# Patient Record
Sex: Female | Born: 1975
Health system: Southern US, Community
[De-identification: ages and names within clinical notes are randomized; demographics above are authoritative.]

## PROBLEM LIST (undated history)

## (undated) DIAGNOSIS — Z21 Asymptomatic human immunodeficiency virus [HIV] infection status: Secondary | ICD-10-CM

## (undated) DIAGNOSIS — B20 Human immunodeficiency virus [HIV] disease: Secondary | ICD-10-CM

## (undated) HISTORY — DX: Human immunodeficiency virus (HIV) disease: B20

## (undated) HISTORY — PX: NO PAST SURGERIES: SHX2092

## (undated) HISTORY — DX: Asymptomatic human immunodeficiency virus (hiv) infection status: Z21

---

## 1997-12-17 ENCOUNTER — Emergency Department (HOSPITAL_COMMUNITY): Admission: EM | Admit: 1997-12-17 | Discharge: 1997-12-17 | Payer: Self-pay | Admitting: Emergency Medicine

## 1997-12-18 ENCOUNTER — Inpatient Hospital Stay (HOSPITAL_COMMUNITY): Admission: AD | Admit: 1997-12-18 | Discharge: 1997-12-18 | Payer: Self-pay | Admitting: Obstetrics & Gynecology

## 1997-12-20 ENCOUNTER — Inpatient Hospital Stay (HOSPITAL_COMMUNITY): Admission: RE | Admit: 1997-12-20 | Discharge: 1997-12-23 | Payer: Self-pay | Admitting: Obstetrics

## 1998-06-06 ENCOUNTER — Emergency Department (HOSPITAL_COMMUNITY): Admission: EM | Admit: 1998-06-06 | Discharge: 1998-06-06 | Payer: Self-pay | Admitting: Emergency Medicine

## 1999-06-16 ENCOUNTER — Emergency Department (HOSPITAL_COMMUNITY): Admission: EM | Admit: 1999-06-16 | Discharge: 1999-06-16 | Payer: Self-pay | Admitting: Emergency Medicine

## 2000-01-26 ENCOUNTER — Emergency Department (HOSPITAL_COMMUNITY): Admission: EM | Admit: 2000-01-26 | Discharge: 2000-01-26 | Payer: Self-pay | Admitting: Emergency Medicine

## 2000-02-23 ENCOUNTER — Inpatient Hospital Stay (HOSPITAL_COMMUNITY): Admission: EM | Admit: 2000-02-23 | Discharge: 2000-02-23 | Payer: Self-pay | Admitting: *Deleted

## 2000-03-03 ENCOUNTER — Inpatient Hospital Stay (HOSPITAL_COMMUNITY): Admission: EM | Admit: 2000-03-03 | Discharge: 2000-03-03 | Payer: Self-pay | Admitting: *Deleted

## 2000-05-10 ENCOUNTER — Emergency Department (HOSPITAL_COMMUNITY): Admission: EM | Admit: 2000-05-10 | Discharge: 2000-05-10 | Payer: Self-pay | Admitting: Emergency Medicine

## 2001-06-12 ENCOUNTER — Inpatient Hospital Stay (HOSPITAL_COMMUNITY): Admission: AD | Admit: 2001-06-12 | Discharge: 2001-06-12 | Payer: Self-pay | Admitting: *Deleted

## 2002-03-03 ENCOUNTER — Emergency Department (HOSPITAL_COMMUNITY): Admission: EM | Admit: 2002-03-03 | Discharge: 2002-03-03 | Payer: Self-pay

## 2002-06-20 ENCOUNTER — Emergency Department (HOSPITAL_COMMUNITY): Admission: EM | Admit: 2002-06-20 | Discharge: 2002-06-20 | Payer: Self-pay | Admitting: Emergency Medicine

## 2002-06-21 ENCOUNTER — Emergency Department (HOSPITAL_COMMUNITY): Admission: EM | Admit: 2002-06-21 | Discharge: 2002-06-22 | Payer: Self-pay | Admitting: Emergency Medicine

## 2006-10-18 ENCOUNTER — Emergency Department (HOSPITAL_COMMUNITY): Admission: EM | Admit: 2006-10-18 | Discharge: 2006-10-18 | Payer: Self-pay | Admitting: Emergency Medicine

## 2011-03-14 ENCOUNTER — Inpatient Hospital Stay (HOSPITAL_COMMUNITY)
Admission: RE | Admit: 2011-03-14 | Discharge: 2011-03-14 | Disposition: A | Discharge status: Home / Self Care | Payer: Private Health Insurance - Indemnity | Source: Ambulatory Visit | Attending: Family Medicine | Admitting: Family Medicine

## 2011-03-21 ENCOUNTER — Emergency Department (HOSPITAL_COMMUNITY): Payer: Medicaid Other

## 2011-03-21 ENCOUNTER — Emergency Department (HOSPITAL_COMMUNITY)
Admission: EM | Admit: 2011-03-21 | Discharge: 2011-03-21 | Disposition: A | Discharge status: Home / Self Care | Payer: Medicaid Other | Attending: Emergency Medicine | Admitting: Emergency Medicine

## 2011-03-21 DIAGNOSIS — J189 Pneumonia, unspecified organism: Secondary | ICD-10-CM | POA: Insufficient documentation

## 2011-03-21 DIAGNOSIS — R059 Cough, unspecified: Secondary | ICD-10-CM | POA: Insufficient documentation

## 2011-03-21 DIAGNOSIS — R3989 Other symptoms and signs involving the genitourinary system: Secondary | ICD-10-CM | POA: Insufficient documentation

## 2011-03-21 DIAGNOSIS — R079 Chest pain, unspecified: Secondary | ICD-10-CM | POA: Insufficient documentation

## 2011-03-21 DIAGNOSIS — R0602 Shortness of breath: Secondary | ICD-10-CM | POA: Insufficient documentation

## 2011-03-21 DIAGNOSIS — N390 Urinary tract infection, site not specified: Secondary | ICD-10-CM | POA: Insufficient documentation

## 2011-03-21 DIAGNOSIS — F17200 Nicotine dependence, unspecified, uncomplicated: Secondary | ICD-10-CM | POA: Insufficient documentation

## 2011-03-21 DIAGNOSIS — R05 Cough: Secondary | ICD-10-CM | POA: Insufficient documentation

## 2011-03-21 LAB — URINALYSIS, ROUTINE W REFLEX MICROSCOPIC
Protein, ur: 30 mg/dL — AB
Urobilinogen, UA: 0.2 mg/dL (ref 0.0–1.0)

## 2011-03-21 LAB — URINE MICROSCOPIC-ADD ON

## 2011-03-31 ENCOUNTER — Inpatient Hospital Stay (HOSPITAL_COMMUNITY): Payer: Medicaid Other

## 2011-03-31 ENCOUNTER — Inpatient Hospital Stay (HOSPITAL_COMMUNITY)
Admission: AD | Admit: 2011-03-31 | Discharge: 2011-03-31 | Disposition: A | Discharge status: Still In Hospital | Payer: Medicaid Other | Source: Ambulatory Visit | Attending: Family Medicine | Admitting: Family Medicine

## 2011-03-31 ENCOUNTER — Encounter (HOSPITAL_COMMUNITY): Payer: Self-pay | Admitting: *Deleted

## 2011-03-31 ENCOUNTER — Inpatient Hospital Stay (HOSPITAL_COMMUNITY)
Admission: EM | Admit: 2011-03-31 | Discharge: 2011-04-07 | DRG: 976 | Disposition: A | Discharge status: Home / Self Care | Payer: Medicaid Other | Attending: Internal Medicine | Admitting: Internal Medicine

## 2011-03-31 DIAGNOSIS — J189 Pneumonia, unspecified organism: Secondary | ICD-10-CM

## 2011-03-31 DIAGNOSIS — E875 Hyperkalemia: Secondary | ICD-10-CM | POA: Diagnosis present

## 2011-03-31 DIAGNOSIS — F411 Generalized anxiety disorder: Secondary | ICD-10-CM | POA: Diagnosis present

## 2011-03-31 DIAGNOSIS — B59 Pneumocystosis: Secondary | ICD-10-CM | POA: Diagnosis present

## 2011-03-31 DIAGNOSIS — B370 Candidal stomatitis: Secondary | ICD-10-CM | POA: Diagnosis present

## 2011-03-31 DIAGNOSIS — R0902 Hypoxemia: Secondary | ICD-10-CM | POA: Diagnosis present

## 2011-03-31 DIAGNOSIS — D473 Essential (hemorrhagic) thrombocythemia: Secondary | ICD-10-CM | POA: Diagnosis present

## 2011-03-31 DIAGNOSIS — F17200 Nicotine dependence, unspecified, uncomplicated: Secondary | ICD-10-CM | POA: Diagnosis present

## 2011-03-31 DIAGNOSIS — R109 Unspecified abdominal pain: Secondary | ICD-10-CM | POA: Insufficient documentation

## 2011-03-31 DIAGNOSIS — B20 Human immunodeficiency virus [HIV] disease: Principal | ICD-10-CM | POA: Diagnosis present

## 2011-03-31 DIAGNOSIS — D638 Anemia in other chronic diseases classified elsewhere: Secondary | ICD-10-CM | POA: Diagnosis present

## 2011-03-31 DIAGNOSIS — N949 Unspecified condition associated with female genital organs and menstrual cycle: Secondary | ICD-10-CM | POA: Insufficient documentation

## 2011-03-31 DIAGNOSIS — J45909 Unspecified asthma, uncomplicated: Secondary | ICD-10-CM | POA: Diagnosis present

## 2011-03-31 LAB — DIFFERENTIAL
Basophils Absolute: 0 10*3/uL (ref 0.0–0.1)
Lymphocytes Relative: 5 % — ABNORMAL LOW (ref 12–46)
Monocytes Absolute: 0.6 10*3/uL (ref 0.1–1.0)
Monocytes Relative: 3 % (ref 3–12)
Neutro Abs: 19.9 10*3/uL — ABNORMAL HIGH (ref 1.7–7.7)

## 2011-03-31 LAB — URINALYSIS, ROUTINE W REFLEX MICROSCOPIC
Glucose, UA: NEGATIVE mg/dL
Leukocytes, UA: NEGATIVE
Specific Gravity, Urine: 1.01 (ref 1.005–1.030)
pH: 7 (ref 5.0–8.0)

## 2011-03-31 LAB — COMPREHENSIVE METABOLIC PANEL
AST: 15 U/L (ref 0–37)
Alkaline Phosphatase: 78 U/L (ref 39–117)
BUN: 11 mg/dL (ref 6–23)
CO2: 25 mEq/L (ref 19–32)
Chloride: 94 mEq/L — ABNORMAL LOW (ref 96–112)
Creatinine, Ser: 0.55 mg/dL (ref 0.50–1.10)
GFR calc non Af Amer: >60 mL/min (ref >60)
Potassium: 5 mEq/L (ref 3.5–5.1)
Total Bilirubin: 0.2 mg/dL — ABNORMAL LOW (ref 0.3–1.2)

## 2011-03-31 LAB — CBC
HCT: 33.3 % — ABNORMAL LOW (ref 36.0–46.0)
Hemoglobin: 11.5 g/dL — ABNORMAL LOW (ref 12.0–15.0)
RBC: 3.93 MIL/uL (ref 3.87–5.11)
WBC: 21.6 10*3/uL — ABNORMAL HIGH (ref 4.0–10.5)

## 2011-03-31 MED ORDER — HYDROXYZINE HCL 50 MG/ML IM SOLN: 50.0000 mg | Freq: Once | INTRAMUSCULAR | Status: DC

## 2011-03-31 MED ORDER — LORAZEPAM 1 MG PO TABS
1.0000 mg | ORAL_TABLET | Freq: Once | ORAL | Status: AC
  Administered 2011-03-31: 1 mg via ORAL
  Filled 2011-03-31: qty 1

## 2011-03-31 NOTE — Progress Notes (Signed)
Pt complaining of shortness of breath.  

## 2011-03-31 NOTE — ED Provider Notes (Addendum)
History    patient is a 35 year old black female who presents today complaining of abdominal pain, vaginal discharge, shortness of breath. She states she was recently diagnosed with bronchitis and pneumonia. She was given a prescription for Levaquin but had an allergic reaction. On Tuesday of this week she was given steroid injection as well as a prescription for prednisone. At that time she was also placed on an albuterol inhaler. She states her symptoms have continued. She denies fever but states she does have a lot of anxiety and has frequent panic attacks. She thinks some of her shortness of breath is coming from her panic attacks. She also complains of a clear vaginal discharge as well as lower abdominal pain.  Chief Complaint  Patient presents with  . Abdominal Pain   HPI  OB History    No data available      No past medical history on file.  No past surgical history on file.  No family history on file.  History  Substance Use Topics  . Smoking status: Not on file  . Smokeless tobacco: Not on file  . Alcohol Use: Not on file    Allergies: Allergies not on file  No prescriptions prior to admission    Review of Systems  Constitutional: Positive for weight loss, malaise/fatigue and diaphoresis. Negative for fever.  HENT: Positive for congestion and sore throat.   Eyes: Negative for blurred vision and double vision.  Respiratory: Positive for shortness of breath and wheezing. Negative for cough, hemoptysis and sputum production.   Cardiovascular: Negative for chest pain, palpitations, orthopnea and claudication.  Gastrointestinal: Positive for abdominal pain. Negative for nausea, vomiting, diarrhea and constipation.  Genitourinary: Negative for dysuria, urgency, frequency, hematuria and flank pain.  Neurological: Positive for dizziness and weakness. Negative for headaches.  Psychiatric/Behavioral: Negative for depression and suicidal ideas. The patient is nervous/anxious.      Physical Exam   Blood pressure 131/84, pulse 127, temperature 97.4 F (36.3 C), temperature source Oral, resp. rate 24, height 5' (1.524 m), weight 102 lb (46.267 kg), SpO2 91.00%.  Physical Exam  Constitutional: She appears well-developed and well-nourished. No distress.  HENT:  Head: Normocephalic and atraumatic.  Eyes: EOM are normal. Pupils are equal, round, and reactive to light.  Cardiovascular: Regular rhythm.  Tachycardia present.  Exam reveals no friction rub.   No murmur heard. Respiratory: No accessory muscle usage. Tachypnea noted. No apnea. No respiratory distress. She has decreased breath sounds. She has no wheezes. She has no rhonchi. She has no rales.       On examination the patient has poor inspiratory effort. She takes very shallow deep breaths.  GI: Soft. Normal appearance.  Skin: She is not diaphoretic.    MAU Course  Procedures  CBC and CMET are pending.  *RADIOLOGY REPORT*  Clinical Data: Short of breath  CHEST - 2 VIEW  Comparison: 03/21/2011  Findings: Worsening perihilar and bibasilar interstitial  infiltrates. No effusion. Heart size normal. Regional bones  unremarkable.  IMPRESSION:  1. Progressive perihilar and bibasilar interstitial infiltrates  Original Report Authenticated By: Osa Craver, M.D.   Assessment and Plan  1) pneumonia: Patient has worsening findings on chest x-ray. Her O2 sat has been in the 80s and low 90s. I did discuss this patient with Dr. Dierdre Highman at [facility] long emergency department. At this time, an argument can be made for admission. Dr. Dierdre Highman has agreed to see this patient. Therefore, she will be transported to [facility] long emergency  department for further evaluation and probable admission. I did discuss this with the patient at length. She understood and agreed.  Clinton Gallant. Rice III, DrHSc, MPAS, PA-C  03/31/2011, 6:39 PM   Henrietta Hoover, PA 04/08/11 [phone]

## 2011-03-31 NOTE — Progress Notes (Signed)
Went to [facility] about a wk ago, bronchitis was acting up and had abd pain.  Was dx with UTI.  Was given levofloxan- started it 07/09.  States had an allergic reaction- got short of breath, sweating, dehydrated, pain in arm.  Took 6 doses.  Went to [facility] on Tues, given a steroid shot and an inhaler and a prescription for prednisone.  Now having abd pain, and clear vag d/c.

## 2011-04-01 LAB — CBC
MCH: 28.8 pg (ref 26.0–34.0)
MCHC: 34.3 g/dL (ref 30.0–36.0)
Platelets: 436 10*3/uL — ABNORMAL HIGH (ref 150–400)
RDW: 12.3 % (ref 11.5–15.5)

## 2011-04-01 LAB — COMPREHENSIVE METABOLIC PANEL
ALT: 13 U/L (ref 0–35)
Alkaline Phosphatase: 72 U/L (ref 39–117)
BUN: 11 mg/dL (ref 6–23)
CO2: 26 mEq/L (ref 19–32)
GFR calc Af Amer: >60 mL/min (ref >60)
GFR calc non Af Amer: >60 mL/min (ref >60)
Glucose, Bld: 92 mg/dL (ref 70–99)
Potassium: 4.4 mEq/L (ref 3.5–5.1)
Total Bilirubin: 0.1 mg/dL — ABNORMAL LOW (ref 0.3–1.2)
Total Protein: 7.5 g/dL (ref 6.0–8.3)

## 2011-04-01 LAB — IRON AND TIBC
Iron: 68 ug/dL (ref 42–135)
TIBC: 166 ug/dL — ABNORMAL LOW (ref 250–470)
UIBC: 98 ug/dL

## 2011-04-01 LAB — DIFFERENTIAL
Basophils Absolute: 0 10*3/uL (ref 0.0–0.1)
Eosinophils Absolute: 0 10*3/uL (ref 0.0–0.7)
Lymphs Abs: 1.5 10*3/uL (ref 0.7–4.0)
Monocytes Absolute: 1.5 10*3/uL — ABNORMAL HIGH (ref 0.1–1.0)
Monocytes Relative: 10 % (ref 3–12)
Neutro Abs: 12.4 10*3/uL — ABNORMAL HIGH (ref 1.7–7.7)

## 2011-04-01 LAB — CARDIAC PANEL(CRET KIN+CKTOT+MB+TROPI)
CK, MB: 0.8 ng/mL (ref 0.3–4.0)
CK, MB: 2 ng/mL (ref 0.3–4.0)
Relative Index: INVALID (ref 0.0–2.5)
Total CK: 38 U/L (ref 7–177)
Total CK: 8 U/L (ref 7–177)

## 2011-04-01 LAB — FOLATE: Folate: 4.5 ng/mL

## 2011-04-01 LAB — VITAMIN B12: Vitamin B-12: 318 pg/mL (ref 211–911)

## 2011-04-01 LAB — MAGNESIUM: Magnesium: 2.6 mg/dL — ABNORMAL HIGH (ref 1.5–2.5)

## 2011-04-01 NOTE — H&P (Signed)
NAME:  Laura Rogers, Laura Rogers NO.:  [id number]  MEDICAL RECORD NO.:  [id number]  LOCATION:  WLED                         FACILITY:  [facility]  PHYSICIAN:  Talmage Nap, MD  DATE OF BIRTH:  1976/02/19  DATE OF ADMISSION:  03/31/2011 DATE OF DISCHARGE:                             HISTORY & PHYSICAL   History obtainable from the patient and the patient's significant other.  CHIEF COMPLAINT:  Cough, chest pain, and shortness of breath of about 10 days duration.  HISTORY:  The patient is a 35 year old African American female with history of asthma and anxiety disorder.  She initially presented to [facility] on July 9th with complaint of cough that was productive of whitish sputum with associated pleuritic chest pain.  In that presentation, the patient also complained about a subjective feeling of fever.  No chills.  No rigor.  No nausea or vomiting.  She, however, said she had persistent shortness of breath, but denied any PND or orthopnea.  The patient was treated for acute bronchitis as an outpatient and was given Levaquin.  The patient claimed she continued to take Levaquin, but observed that her tongue was getting swollen and she had whitish coating of the mouth.  She also observed that she was not getting better and subsequently presented to Urgent Care.  At Urgent Care, the patient was given steroid injection and subsequently sent home, but her condition continued to deteriorate and presented to [facility] hospital.  At [facility] hospital, the patient had a chest x-ray done, which showed right perihilar infiltrate and subsequently was asked to come back to [facility].  At that time, the patient was seen by me,  she complained about cough that was productive of sputum with associated pleuritic chest pain.  She denied any nausea.  She denied any vomiting.  She denied any PND or orthopnea, but complained of shortness of breath.  After evaluation, she  was advised to be admitted for stabilization.  PAST MEDICAL HISTORY:  Positive for, 1. Asthma. 2. Anxiety disorder.  PAST SURGICAL HISTORY:  Ectopic gestation, status post salpingostomy.  PREADMISSION MEDICATIONS: 1. Albuterol. 2. Ativan. 3. Prednisone. 4. Naproxen.  ALLERGIES: 1. LEVAQUIN. 2. PENICILLIN.  SOCIAL HISTORY:  The patient smokes cigarettes 1 packet to last the patient for about 7 days and she had been smoking for the past 10 years. Periodically uses street drugs, i.e., marijuana and she works for [employer] as a Diplomatic Services operational officer.  FAMILY HISTORY:  Positive for asthma.  REVIEW OF SYSTEMS:  The patient denies any history of headache.  No blurred or vision.  She complained about cough with associated pleuritic chest pain, cough is said to be productive of whitish or occasional yellowish sputum.  She has subjective feeling of fever.  Denies any chills or rigor and no PND or orthopnea.  No abdominal discomfort.  No diarrhea or hematochezia.  No dysuria or hematuria.  No swelling of the lower extremity.  No intolerance to heat or cold.  No neuropsychiatric disorder.  The patient also complained about whitish coating of the tongue.  PHYSICAL EXAMINATION:  GENERAL:  A young lady, dehydrated, not in any acute respiratory distress, has whitish coating  of the mouth. PRESENT VITAL SIGNS:  Blood pressure is 110/86, pulse is 89, respiratory rate 20, temperature is 98.2. HEENT:  Pupils are reactive to light and extraocular muscles are intact. NECK:  No jugular venous distention.  No carotid bruits and no lymphadenopathy. CHEST: Crackles in her right mid and lower zones of the lung. HEART:  Sounds are one and two.  No murmur. ABDOMEN:  Soft with prominent transverse suprapubic scar.  Liver, spleen, kidney not palpable.  Bowel sounds are positive.  EXTREMITIES: No pedal edema. NEUROLOGIC:  Nonfocal. MUSCULOSKELETAL:  Unremarkable. SKIN:  Decreased turgor.  LABORATORY DATA:   Urinalysis done on the patient was unremarkable. Complete blood count with differential showed WBC of 25.6, hemoglobin of 11.5, hematocrit of 33.3, MCV of 84.7 with platelet count of 541. Differentials showed neutrophils 92% and absolute neutrophil count is 19.9.  Comprehensive metabolic panel showed sodium of 131, potassium of 5.0, chloride of 94 with a bicarbonate of 25, glucose is 135, BUN is 11, creatinine 0.55.  IMAGING STUDIES:  Chest x-ray, which showed progressive perihilar and bibasilar interstitial infiltrates.  IMPRESSION: 1. Right perihilar pneumonia. 2. Asthma. 3. Anxiety disorder. 4. Oral thrush. 5. Anemia. 6. Thrombocytosis most likely secondary to anemia.  PLAN:  Plan is to admit the patient to telemetry.  The patient will be slowly rehydrated with half-normal saline IV to go at a rate of 80 cc an hour.  She will be on Rocephin 1 g IV q.24 h., Zithromax 500 mg IV q.24 h.  She also be given albuterol/Atrovent nebulizers q.4 h. and for her anxiety, the patient will be on Xanax 0.25 mg p.o. b.i.d.  Pain will be controlled with morphine 1 mg IV q.4 h., p.r.n. and for the oral thrush, the patient will be on nystatin mouthwash 10 cc p.o. t.i.d.  GI prophylaxis will be done on Protonix 40 mg IV q.24 h. and DVT prophylaxis with TED stockings.  Further workup to be done on this patient will include cardiac enzymes q.6 h. x3, blood culture x2 before starting IV antibiotics, CBC, CMP and magnesium will be repeated in the a.m.  The patient will be followed and evaluated on day-to-day basis.     Talmage Nap, MD     CN/MEDQ  D:  04/01/2011  T:  04/01/2011  Job:  [id number]  Electronically Signed by Talmage Nap  on 04/01/2011 04:24:08 AM

## 2011-04-02 LAB — DIFFERENTIAL
Basophils Absolute: 0 10*3/uL (ref 0.0–0.1)
Basophils Relative: 0 % (ref 0–1)
Eosinophils Relative: 2 % (ref 0–5)
Lymphocytes Relative: 12 % (ref 12–46)
Monocytes Absolute: 1.3 10*3/uL — ABNORMAL HIGH (ref 0.1–1.0)

## 2011-04-02 LAB — URINE MICROSCOPIC-ADD ON

## 2011-04-02 LAB — BASIC METABOLIC PANEL
BUN: 5 mg/dL — ABNORMAL LOW (ref 6–23)
Calcium: 8.6 mg/dL (ref 8.4–10.5)
Creatinine, Ser: 0.52 mg/dL (ref 0.50–1.10)
GFR calc Af Amer: >60 mL/min (ref >60)
GFR calc non Af Amer: >60 mL/min (ref >60)
Potassium: 4.3 mEq/L (ref 3.5–5.1)

## 2011-04-02 LAB — URINALYSIS, ROUTINE W REFLEX MICROSCOPIC
Bilirubin Urine: NEGATIVE
Glucose, UA: NEGATIVE mg/dL
Specific Gravity, Urine: 1.004 — ABNORMAL LOW (ref 1.005–1.030)
pH: 7 (ref 5.0–8.0)

## 2011-04-02 LAB — CBC
HCT: 30.1 % — ABNORMAL LOW (ref 36.0–46.0)
MCHC: 33.9 g/dL (ref 30.0–36.0)
RDW: 12.3 % (ref 11.5–15.5)

## 2011-04-02 LAB — EXPECTORATED SPUTUM ASSESSMENT W GRAM STAIN, RFLX TO RESP C

## 2011-04-03 ENCOUNTER — Inpatient Hospital Stay (HOSPITAL_COMMUNITY): Payer: Medicaid Other

## 2011-04-03 LAB — URINE CULTURE
Colony Count: NO GROWTH
Culture: NO GROWTH

## 2011-04-03 LAB — DIFFERENTIAL
Eosinophils Absolute: 0.4 10*3/uL (ref 0.0–0.7)
Eosinophils Relative: 2 % (ref 0–5)
Lymphocytes Relative: 6 % — ABNORMAL LOW (ref 12–46)
Lymphs Abs: 0.9 10*3/uL (ref 0.7–4.0)
Monocytes Relative: 8 % (ref 3–12)

## 2011-04-03 LAB — CBC
HCT: 31.7 % — ABNORMAL LOW (ref 36.0–46.0)
MCH: 27.8 pg (ref 26.0–34.0)
MCV: 83.9 fL (ref 78.0–100.0)
Platelets: 450 10*3/uL — ABNORMAL HIGH (ref 150–400)
RBC: 3.78 MIL/uL — ABNORMAL LOW (ref 3.87–5.11)
RDW: 12.3 % (ref 11.5–15.5)

## 2011-04-03 LAB — BASIC METABOLIC PANEL
BUN: 7 mg/dL (ref 6–23)
CO2: 25 mEq/L (ref 19–32)
Calcium: 8.6 mg/dL (ref 8.4–10.5)
Chloride: 97 mEq/L (ref 96–112)
Creatinine, Ser: 0.66 mg/dL (ref 0.50–1.10)

## 2011-04-03 LAB — LEGIONELLA ANTIGEN, URINE: Legionella Antigen, Urine: NEGATIVE

## 2011-04-03 MED ORDER — IOHEXOL 300 MG/ML  SOLN
100.0000 mL | Freq: Once | INTRAMUSCULAR | Status: AC | PRN
  Administered 2011-04-03: 100 mL via INTRAVENOUS

## 2011-04-04 DIAGNOSIS — J9600 Acute respiratory failure, unspecified whether with hypoxia or hypercapnia: Secondary | ICD-10-CM

## 2011-04-04 DIAGNOSIS — J189 Pneumonia, unspecified organism: Secondary | ICD-10-CM

## 2011-04-04 LAB — CULTURE, RESPIRATORY W GRAM STAIN

## 2011-04-04 LAB — CBC
Hemoglobin: 10.7 g/dL — ABNORMAL LOW (ref 12.0–15.0)
MCH: 27.9 pg (ref 26.0–34.0)
MCHC: 32.9 g/dL (ref 30.0–36.0)
Platelets: 447 10*3/uL — ABNORMAL HIGH (ref 150–400)
RDW: 12.4 % (ref 11.5–15.5)

## 2011-04-04 LAB — DIFFERENTIAL
Basophils Absolute: 0 10*3/uL (ref 0.0–0.1)
Basophils Relative: 0 % (ref 0–1)
Eosinophils Absolute: 0.4 10*3/uL (ref 0.0–0.7)
Eosinophils Relative: 4 % (ref 0–5)
Monocytes Absolute: 0.9 10*3/uL (ref 0.1–1.0)
Monocytes Relative: 8 % (ref 3–12)
Neutro Abs: 9 10*3/uL — ABNORMAL HIGH (ref 1.7–7.7)

## 2011-04-04 LAB — BASIC METABOLIC PANEL
Calcium: 9 mg/dL (ref 8.4–10.5)
GFR calc Af Amer: >60 mL/min (ref >60)
GFR calc non Af Amer: >60 mL/min (ref >60)
Glucose, Bld: 96 mg/dL (ref 70–99)
Potassium: 4.4 mEq/L (ref 3.5–5.1)
Sodium: 134 mEq/L — ABNORMAL LOW (ref 135–145)

## 2011-04-04 LAB — GRAM STAIN

## 2011-04-04 LAB — LACTATE DEHYDROGENASE: LDH: 376 U/L — ABNORMAL HIGH (ref 94–250)

## 2011-04-04 LAB — PRO B NATRIURETIC PEPTIDE: Pro B Natriuretic peptide (BNP): 14.3 pg/mL (ref 0–125)

## 2011-04-05 LAB — BASIC METABOLIC PANEL
Calcium: 9.3 mg/dL (ref 8.4–10.5)
GFR calc non Af Amer: >60 mL/min (ref >60)
Sodium: 129 mEq/L — ABNORMAL LOW (ref 135–145)

## 2011-04-05 LAB — DIFFERENTIAL
Basophils Relative: 0 % (ref 0–1)
Eosinophils Absolute: 0.3 10*3/uL (ref 0.0–0.7)
Monocytes Absolute: 1.1 10*3/uL — ABNORMAL HIGH (ref 0.1–1.0)
Monocytes Relative: 8 % (ref 3–12)
Neutrophils Relative %: 83 % — ABNORMAL HIGH (ref 43–77)

## 2011-04-05 LAB — CBC
MCH: 27.9 pg (ref 26.0–34.0)
MCHC: 33.7 g/dL (ref 30.0–36.0)
Platelets: 516 10*3/uL — ABNORMAL HIGH (ref 150–400)

## 2011-04-05 LAB — T-HELPER CELLS (CD4) COUNT (NOT AT ARMC)
CD4 % Helper T Cell: 3 % — ABNORMAL LOW (ref 33–55)
CD4 T Cell Abs: 20 uL — ABNORMAL LOW (ref 400–2700)

## 2011-04-06 DIAGNOSIS — Z21 Asymptomatic human immunodeficiency virus [HIV] infection status: Secondary | ICD-10-CM | POA: Insufficient documentation

## 2011-04-06 DIAGNOSIS — B20 Human immunodeficiency virus [HIV] disease: Secondary | ICD-10-CM | POA: Insufficient documentation

## 2011-04-06 LAB — CBC
MCH: 28.2 pg (ref 26.0–34.0)
MCV: 81.4 fL (ref 78.0–100.0)
RBC: 4.08 MIL/uL (ref 3.87–5.11)

## 2011-04-06 LAB — HEPATITIS PANEL, ACUTE
HCV Ab: NEGATIVE
Hep A IgM: NEGATIVE
Hep B C IgM: NEGATIVE

## 2011-04-06 LAB — DIFFERENTIAL
Eosinophils Absolute: 0 10*3/uL (ref 0.0–0.7)
Eosinophils Relative: 0 % (ref 0–5)
Lymphs Abs: 0.8 10*3/uL (ref 0.7–4.0)
Monocytes Relative: 8 % (ref 3–12)

## 2011-04-06 LAB — COMPREHENSIVE METABOLIC PANEL
ALT: 9 U/L (ref 0–35)
Albumin: 2.1 g/dL — ABNORMAL LOW (ref 3.5–5.2)
Alkaline Phosphatase: 79 U/L (ref 39–117)
Chloride: 95 mEq/L — ABNORMAL LOW (ref 96–112)
Glucose, Bld: 113 mg/dL — ABNORMAL HIGH (ref 70–99)
Potassium: 5.5 mEq/L — ABNORMAL HIGH (ref 3.5–5.1)
Sodium: 130 mEq/L — ABNORMAL LOW (ref 135–145)
Total Bilirubin: 0.1 mg/dL — ABNORMAL LOW (ref 0.3–1.2)
Total Protein: 8.1 g/dL (ref 6.0–8.3)

## 2011-04-06 LAB — PNEUMOCYSTIS JIROVECI SMEAR BY DFA: Pneumocystis jiroveci Ag: NEGATIVE

## 2011-04-07 LAB — CULTURE, BLOOD (ROUTINE X 2)
Culture  Setup Time: 201207201104
Culture  Setup Time: 201207201104
Culture: NO GROWTH

## 2011-04-07 NOTE — Consult Note (Signed)
NAMENELWYN, HEBDON NO.:  [id number]  MEDICAL RECORD NO.:  [id number]  LOCATION:  1428                         FACILITY:  [facility]  PHYSICIAN:  Charlaine Dalton. Sherene Sires, MD, FCCPDATE OF BIRTH:  28-Sep-1975  DATE OF CONSULTATION: DATE OF DISCHARGE:                                CONSULTATION   REQUESTING PHYSICIANS: 1. Dr. Janee Morn 2. [facility]  REASON FOR CONSULTATION:  Pneumonia syndrome.  HISTORY:  This is a 35 year old black female with a history of longstanding, but very mild cigarette use and intermittent use of street drugs "who was admitted on July 20th with approximately 2-week history of persistent cough with minimal white mucus production, generalized chest discomfort from coughing, and subjective fever with no rigors. She is having increasing dyspnea with exertion, but nothing at rest and no PND or orthopnea.  She had already been treated as an outpatient with Levaquin, but developed tongue swelling and whitish coat on her mouth, so stopped her Levaquin because that she is allergic to it.  On her initial evaluation, she was felt to have thrush with diffuse infiltrates on chest x-ray, confirmed on CT scan, and Pulmonary and ID were asked to see her in the same afternoon.  She denies any significant sore throat, difficulty swallowing, active sinus complaints, classic lateralizing or exertional chest pain, myalgias, arthralgias, diarrhea or unusual exposure history.  PAST MEDICAL HISTORY:  She has had no previous respiratory disorders, although she is listed as having both asthma and anxiety.  PAST SURGICAL HISTORY:  Significant for ectopic gestation, status post salpingostomy.  MEDICATIONS PRIOR TO ADMISSION:  Albuterol, Ativan, prednisone, and naproxen, although the patient that she had never been on any pulmonary or respiratory medicines at all prior to 2 weeks ago.  ALLERGIES:  Levaquin possible (although I believe the diagnosis  is candidiasis not an allergy) and penicillin nonspecific reaction.  SOCIAL HISTORY:  She is a light smoker, previously worked for [employer]. She admits to using street drugs, but did not elaborate.  FAMILY HISTORY:  Positive for asthma.  Negative for rheumatologic disease.  REVIEW OF SYSTEMS:  Taken in detail, negative.  All systems negative except as already outlined above.  PHYSICAL EXAMINATION:  GENERAL:  This is a relatively thin black female, in no acute distress. VITAL SIGNS:  Stable. HEENT:  Remarkable for thrush. NECK:  Supple without cervical adenopathy or tenderness.  Trachea is midline. LUNGS:  Lung fields reveal minimal crackles on inspiration.  There is no wheezing on expiration. HEART:  Regular rate and rhythm without murmur, gallop, or rub. ABDOMEN:  Soft, benign. EXTREMITIES:  Warm without calf tenderness, cyanosis, or clubbing.  IMAGING:  A series of chest x-rays beginning on July 9th with subtle infiltrates and then by July 22nd has diffuse interstitial changes, which is confirmed on CT scan dated July 22nd.  She has a sed rate of 97 with an LDH of 376 and HIV which is pending.  Her urine legionella is negative as was urine strep and a white count is significant for the absence of eosinophilia and relative lymphopenia.  IMPRESSION:  Progressive pulmonary infiltrates with minimal evidence to suggest an acute pneumonia syndrome.  The pattern on  chest x-ray is classic for PCP pneumonia, although the differential diagnosis needs to include viral pneumonia and bronchiolitis obliterans organizing pneumonia as well as acute interstitial pneumonia and hypersensitivity pneumonitis as well as desquamative interstitial pneumonia (which is largely disease of smokers) as well as Langerhans cell histiocytosis (previously referred to as eosinophilic granulomatosis or Langerhans cell histiocytosis) both of these added to a diagnoses much more common in smokers.  Her human  immunodeficiency virus is pending.  If it is positive, an induced sputum should be all that is necessary to confirm the diagnosis of PCP pneumonia.  If not, we could consider performing a transbronchial biopsy later this week.  In the meantime, she has already been started on appropriate broad antibiotics to include sulfa drugs.  I would hold on steroids until she is seen by Infectious Disease.     Charlaine Dalton. Sherene Sires, MD, [facility]     MBW/MEDQ  D:  04/04/2011  T:  04/05/2011  Job:  [id number]  Electronically Signed by Sandrea Hughs MD FCCP on 04/07/2011 05:42:10 PM

## 2011-04-11 ENCOUNTER — Ambulatory Visit (INDEPENDENT_AMBULATORY_CARE_PROVIDER_SITE_OTHER): Payer: Medicaid Other | Admitting: Infectious Diseases

## 2011-04-11 ENCOUNTER — Encounter: Payer: Self-pay | Admitting: Infectious Diseases

## 2011-04-11 DIAGNOSIS — Z79899 Other long term (current) drug therapy: Secondary | ICD-10-CM

## 2011-04-11 DIAGNOSIS — K5900 Constipation, unspecified: Secondary | ICD-10-CM | POA: Insufficient documentation

## 2011-04-11 DIAGNOSIS — F419 Anxiety disorder, unspecified: Secondary | ICD-10-CM | POA: Insufficient documentation

## 2011-04-11 DIAGNOSIS — B20 Human immunodeficiency virus [HIV] disease: Secondary | ICD-10-CM

## 2011-04-11 DIAGNOSIS — B59 Pneumocystosis: Secondary | ICD-10-CM

## 2011-04-11 DIAGNOSIS — F411 Generalized anxiety disorder: Secondary | ICD-10-CM

## 2011-04-11 LAB — LIPID PANEL
Cholesterol: 191 mg/dL (ref 0–200)
HDL: 37 mg/dL — ABNORMAL LOW (ref >39)
Triglycerides: 203 mg/dL — ABNORMAL HIGH (ref <150)

## 2011-04-11 LAB — COMPREHENSIVE METABOLIC PANEL
Albumin: 3 g/dL — ABNORMAL LOW (ref 3.5–5.2)
BUN: 12 mg/dL (ref 6–23)
CO2: 20 mEq/L (ref 19–32)
Calcium: 8.3 mg/dL — ABNORMAL LOW (ref 8.4–10.5)
Chloride: 91 mEq/L — ABNORMAL LOW (ref 96–112)
Glucose, Bld: 125 mg/dL — ABNORMAL HIGH (ref 70–99)
Potassium: 4.2 mEq/L (ref 3.5–5.3)

## 2011-04-11 MED ORDER — EFAVIRENZ-EMTRICITAB-TENOFOVIR 600-200-300 MG PO TABS: 1.0000 | ORAL_TABLET | Freq: Every day | ORAL | Status: DC

## 2011-04-11 MED ORDER — DOCUSATE SODIUM 100 MG PO CAPS: 100.0000 mg | ORAL_CAPSULE | Freq: Two times a day (BID) | ORAL | Status: AC

## 2011-04-11 MED ORDER — SULFAMETHOXAZOLE-TRIMETHOPRIM 800-160 MG PO TABS: 1.0000 | ORAL_TABLET | Freq: Once | ORAL | Status: AC

## 2011-04-11 NOTE — Assessment & Plan Note (Signed)
She appears to be doing well. Will convert her to qd bactrim when she finishes her 21 day course.

## 2011-04-11 NOTE — Progress Notes (Signed)
  Subjective:    Patient ID: Laura Rogers, female    DOB: 1976/05/20, 35 y.o.   MRN: [id number]  HPI 35 yo F with hx of admission this month to [facility] with pneumonia. She had received po anbx prior to adm and while in hospital was noted to have thrush. Her pneumonia did not improve initially. She was diagnosed with AIDS (CD4 20 and VL 190k) and presumptively dx with PCP (her sputum DFA was negative). She was treated with bactrim and prednisone. Has boyfriend of 3 years, he states he had negative test 2 years ago. She states that he has been retested since her positive test and that he is (-).  Today feels well except she feels like she is going to die.    Review of Systems  Constitutional: Negative for fever and chills.  Respiratory: Negative for cough and shortness of breath.   Gastrointestinal: Positive for constipation.  Genitourinary: Negative for dysuria.  Psychiatric/Behavioral: The patient is nervous/anxious.        Objective:   Physical Exam  Constitutional: She appears well-developed and well-nourished.  Eyes: EOM are normal. Pupils are equal, round, and reactive to light.  Neck: Neck supple.  Cardiovascular: Normal rate, regular rhythm and normal heart sounds.   Pulmonary/Chest: Effort normal.  Abdominal: Soft. Bowel sounds are normal. She exhibits no distension.  Musculoskeletal: She exhibits no edema.  Lymphadenopathy:    She has no cervical adenopathy.  Psychiatric: Her mood appears anxious.          Assessment & Plan:   No problem-specific assessment & plan notes found for this encounter.

## 2011-04-11 NOTE — Assessment & Plan Note (Signed)
Will continue her xanax as needed. Will get her into see counseling if she agrees. She defers.

## 2011-04-11 NOTE — Assessment & Plan Note (Addendum)
Her genotype from the hospital is still pending. Spoke with pt about starting atripla- importance of adherence, possibility of rash and abn dreams, need to not become pregnant. She will let us know if she has ADR. She will return in 3-4 weeks.

## 2011-04-11 NOTE — Assessment & Plan Note (Signed)
Will try otc colace

## 2011-04-13 ENCOUNTER — Ambulatory Visit: Payer: Private Health Insurance - Indemnity | Admitting: Infectious Diseases

## 2011-04-13 NOTE — Discharge Summary (Signed)
NAMEKELLY, RANIERI NO.:  [id number]  MEDICAL RECORD NO.:  [id number]  LOCATION:  1428                         FACILITY:  [facility]  PHYSICIAN:  Altha Harm, MDDATE OF BIRTH:  [birth year]  DATE OF ADMISSION:  03/31/2011 DATE OF DISCHARGE:  04/07/2011                              DISCHARGE SUMMARY   DISCHARGE DISPOSITION:  Home.  FINAL DISCHARGE DIAGNOSES: 1. Newly diagnosed acquired immunodeficiency syndrome/human     immunodeficiency virus. 2. Pneumocystis pneumonia. 3. Hyperkalemia. 4. Anxiety. 5. Oral candidiasis. 6. Hypoxemia, resolved.  DISCHARGE MEDICATIONS:  Includes the following: 1. Xanax 0.25 mg p.o. b.i.d. 2. Lasix 20 mg one-half tab p.o. daily for 16 days. 3. Prednisone 40 mg p.o. b.i.d. x1 day, then 40 mg p.o. daily x5 days     and 20 mg p.o. daily x10 days. 4. Protonix 40 mg p.o. daily. 5. Bactrim DS 2 tablets p.o. q.8 h for 16 days. 6. Albuterol inhaler 2 puffs q.4 h p.r.n. shortness of breath.  CONSULTANTS:  Dr. Johny Sax, Infectious Diseases.  PROCEDURES:  None.  DIAGNOSTIC STUDIES: 1. Two-view chest x-ray on admission which shows progressive perihilar     and bibasilar interstitial infiltrates compared to a chest x-ray on     March 21, 2011. 2. Two-view chest x-ray on April 03, 2011, which shows progressive     pneumonia throughout both lungs. 3. A CT angiogram on the April 03, 2011, which shows no pulmonary     embolism.  Impression:  Advance bilateral ground-glass opacities.     Nonspecific pattern often seen as infection or edema.     Atypical/opportunistic infection should be considered.  PRIMARY CARE PHYSICIAN:  Unassigned.  The patient will see Dr. Ninetta Lights in the clinic.  CODE STATUS:  Full code.  ALLERGIES: 1. PENICILLIN. 2. LEVAQUIN.  CHIEF COMPLAINT:  Cough, chest pain and shortness of breath for about 10 days duration.  HISTORY OF PRESENT ILLNESS:  Please refer to Dr. Lesle Chris H and P for details  of the HPI.  However, this is a 35 year old African-American female who presented with a complaint of cough productive of white sputum and pleuritic chest pain for 10 days.  The patient had no chills, no rigor, no nausea or vomiting but persistent shortness of breath.  The patient had been treated for acute bronchitis as an outpatient, was given Levaquin.  The patient states that she continued to take Levaquin but observed that her tongue was swollen and she had a whitish coating of her mouth.  As she was not improving, she presented to the Urgent Care.  The patient was given a steroid injection and sent home.  Her condition continued to deteriorate and she presented to [facility] where it showed a perihilar infiltrate and the patient came back to [facility] for further evaluation and management.  HOSPITAL COURSE: 1. Atypical pneumonia.  The patient presented with atypical pneumonia     and was treated with antibiotics for community-acquired people     pneumonia including Zithromax.  However, with the patient's    hypoxemia as well as the oral thrush, the attending physician     pursued his to rule out HIV.  HIV was found to be positive and     infectious diseases were consulted.  The CT scan was consistent     with ground-glass pneumonia which was most compatible with     Pneumocystis pneumonia.  All cultures were negative and upon the     recommendations of ID, the patient was started treatment for PCP     pneumonia with Bactrim and prednisone.  The patient improved     considerably on this regimen.  The patient was able to ambulate     without hypoxemia on yesterday and today and it was felt that the     patient can be discharged home.  The patient is to follow up with     Dr. Ninetta Lights for an appointment on Monday at 7:30 at 9:00 a.m. for     further treatment and initiation of antiretroviral medications.     Additionally, the patient is to continue taking her Bactrim  and     prednisone for the next 16 days and then thereafter follow up with     Dr. Ninetta Lights for continuation of her Bactrim on prophylaxis basis. 2. Anxiety.  The patient has anxiety and with her anxiety she has     escalation of her heart rate.  When she is not anxious, states her     heart rate stays in the 90s.  However, with significant anxiety,     her heart rates can go to the 120 to 130s but resolves with Xanax.     The patient has been treated with Xanax here in the hospital and     has been given Xanax to take on a b.i.d. basis at home. 3. Hyperkalemia.  The patient was found to have a potassium of 5.5.     She has no EKG changes associated with this.  The patient has been     Lasix 10 mg to be taken daily.  For wasting of potassium, I would     recommend that she have her potassium levels checked on Monday when     she sees Dr. Ninetta Lights.  Otherwise, the patient is stable.  DISCHARGE PHYSICAL EXAMINATION:  At the time of discharge, physical examination is as follows: VITAL SIGNS:  Temperature is 97.5, heart rate 99, blood pressure 111/76, respiratory rate 18, O2 sats are 97% on room air. HEENT EXAMINATION:  She is normocephalic, atraumatic.  Pupils are equal, round and reactive to light and accommodation.  Extraocular movements intact.  Oropharynx shows no exudate, erythema or lesions - notably thrush as resolved.  Trachea is midline.  No masses, no thyromegaly, no JVD, no carotid bruit. RESPIRATORY EXAMINATION:  She has a normal respiratory effort, equal excursion bilaterally.  No wheezing or rhonchi noted. CARDIOVASCULAR:  She has got a normal S1 and S2.  No murmurs, rubs or gallops noted.  PMI is nondisplaced.  No heaves or thrills on palpation. As the patient becomes excited, there is a mild sinus tachycardia noted. ABDOMEN:  Obese, soft, nontender, nondistended.  No masses, no hepatosplenomegaly. EXTREMITIES:  Showed no clubbing, cyanosis or edema.  DIETARY RESTRICTIONS:   None.  PHYSICAL RESTRICTIONS:  Activity as tolerated.  FOLLOWUP:  The patient to follow up with Dr. Johny Sax in the Infectious Disease Clinic on Monday on April 11, 2011, at 9 a.m. in the morning.     Altha Harm, MD     MAM/MEDQ  D:  04/07/2011  T:  04/07/2011  Job:  [id number]  cc:  Lacretia Leigh. Ninetta Lights, M.D. Fax: [phone]  Electronically Signed by Marthann Schiller MD on 04/13/2011 03:35:01 PM

## 2011-04-13 NOTE — Progress Notes (Signed)
NAME:  Laura Rogers, Laura Rogers NO.:  [id number]  MEDICAL RECORD NO.:  [id number]  LOCATION:                                 FACILITY:  PHYSICIAN:  Ramiro Harvest, MD    DATE OF BIRTH:  Jun 13, 1976                                PROGRESS NOTE   This is a progress note covering the events from March 31, 2011,through April 05, 2011.  CURRENT DIAGNOSES: 1. Atypical pneumonia, likely secondary to PCP. 2. Hypoxia secondary to atypical pneumonia. 3. Probably newly diagnosed human immunodeficiency virus, confirmatory     tests pending. 4. Anxiety. 5. Oral thrush.  DISCHARGE MEDICATIONS: Will be dictated per discharging physician.  CURRENT MEDICATIONS: 1. Albuterol 2.5 mg inhalation t.i.d. 2. Xanax 0.25 mg p.o. b.i.d. 3. Azithromycin 500 mg IV daily. 4. Rocephin 1 g IV daily. 5. Ensure 237 cc p.o. t.i.d. 6. Diflucan 100 mg p.o. daily. 7. Mucinex 600 mg p.o. b.i.d. 8. Atrovent 0.5 mg inhalation 3 times daily. 9. Protonix 40 mg p.o. daily. 10.Bactrim DS 2 tablets p.o. q.8 h.  DISPOSITION AND FOLLOW UP: Will be dictated per discharging physician.  CONSULTATIONS DONE.: 1. A ID consultation was done.  The patient was seen in consultation     by Dr. Johny Sax of Infectious Diseases on April 04, 2011. 2. A pulmonary consultation was done.  The patient was seen in     consultation by Dr. Sandrea Hughs of [facility] on April 04, 2011.  PROCEDURES PERFORMED: 1. A chest x-ray was done March 31, 2011, that showed progressive     perihilar and bibasilar interstitial infiltrates. 2. Chest x-ray done April 03, 2011, showed progressive pneumonia     throughout both lungs. 3. CT angiogram of the chest done April 03, 2011, showed no PE,     advanced bilateral ground-glass opacities of nonspecific pattern     often seen with infection.  No edema.  Atypical/opportunistic     infections should be considered.  BRIEF ADMISSION HISTORY AND PHYSICAL: Ms. Laura Rogers is a  35 year old African American female with history of anxiety disorder and asthma, who initially presented to [facility] ED on July 9th with complaints of cough that was productive of whitish sputum with associated pleuritic chest pain.  In that presentation, the patient also complained of subjective fevers.  No chills.  No rigors. No nausea.  No vomiting.  The patient; however, said that she had persistent shortness of breath, but denied any paroxysmal nocturnal dyspnea or orthopnea.  The patient was treated for acute bronchitis as an outpatient and placed on Levaquin.  The patient claimed she continued to take the Levaquin, but observed that the tongue was getting swelling and had a whitish coating of the mouth.  The patient also observed that she was not getting any better and subsequently presented to the Urgent Care.  At Urgent Care, the patient was given a steroid injection and subsequently sent home, but the condition continued to deteriorate and presented to [facility].  At [facility], the patient had a chest x-ray done that showed right perihilar infiltrate and subsequently was asked to present back to [facility]  ED.  At that time, the patient was seen by the admitting physician.  She complained of productive cough of sputum with associated pleuritic chest pain.  She denied any nausea, no vomiting.  Denied any paroxysmal nocturnal dyspnea or orthopnea, but complained of shortness of breath.  After evaluation, she was advised to be admitted for stabilization.  For the rest of admission history and physical, please see H and P dictated by Dr. Beverly Gust of job [phone].  HOSPITAL COURSE: 1. Atypical pneumonia.  The patient was admitted with a pneumonia,     initially being treated as a community-acquired pneumonia.     Antibiotic coverage was switched to Rocephin and azithromycin.  She     was placed on nebulizer treatments as well.  A sputum Gram stain     was also  obtained, which was unremarkable.  Urine strep pneumo,     which was obtained was negative.  Urine Legionella antigen, which     was also obtained was negative at that time.  Respiratory cultures     just showed normal oropharyngeal flora.  The patient was monitored     and followed.  On day of admission, her white count was 21.6.     White count slowly trended down; however, it was fluctuating     between 11 and 15,000.  The patient continued to have shortness of     breath and hypoxia, especially on ambulation where she would desat     into the 70s to the 80s.  There was a concern for worsening     pneumonia.  Repeat chest x-ray was obtained, which showed worsening     pneumonia.  The patient's antibiotic coverage was subsequently     broadened.  Bactrim was added to her regimen.  A CT scan was     subsequently obtained on April 03, 2011, that showed advanced     bilateral ground-glass opacities.  A pulmonary consultation and ID     consultation was obtained.  HIV antibody, which was obtained, came     back reactive.  LDH, which was obtained, came back elevated at 376     . A ProBNP, which was obtained, was negative.  The patient was seen     per Pulmonary on April 04, 2011, and there was a concern for a     probable PCP pneumonia versus BOOP.  At that time, HIV antibody was     pending.  HIV antibody came back positive.  A sputum PCP DSA has     been ordered; however, unable to get any sputum for this test to be     done.  CD-4 count was also ordered, which is currently pending.     The patient is currently being followed by ID and slowly improving.     Prednisone has been added to her regimen as the HIV antibody was     positive.  LDH was elevated.  CD-4 count is pending at this time.     It is presumed that the patient unlikely has a PCP pneumonia and is     being treated for, which is being followed per ID.  Discharge     antibiotics will be determined per discharging physician. 2.  Hypoxia secondary to atypical pneumonia. 3. Probably newly diagnosed HIV.  The patient had presented secondary     to atypical pneumonia due to concerns of a PCP pneumonia.  HIV     antibody was obtained, which came  back positive.  Confirmatory     tests are currently pending.  CD-4 count is also pending due to     patient's presentation with opportunistic probable pneumonia such     as PCP and oral thrush, more than likely the patient does have HIV     and CD-4 count is currently pending at this time.  The patient is     being followed by ID during this hospitalization. 4. Oral thrush.  On admission, the patient was noted to have oral     thrush.  The patient has been placed on Diflucan.  HIV antibody,     which was obtained, was positive.  CD-4 count is currently pending     at this time as well as confirmatory tests.  It has been a pleasure taking care of Ms. Solon Palm.     Ramiro Harvest, MD     DT/MEDQ  D:  04/05/2011  T:  04/05/2011  Job:  [id number]  Electronically Signed by Ramiro Harvest MD on 04/13/2011 08:14:29 PM

## 2011-04-15 ENCOUNTER — Ambulatory Visit: Payer: Self-pay

## 2011-04-21 ENCOUNTER — Ambulatory Visit (INDEPENDENT_AMBULATORY_CARE_PROVIDER_SITE_OTHER): Payer: Medicaid Other | Admitting: Adult Health

## 2011-04-21 ENCOUNTER — Encounter: Payer: Self-pay | Admitting: Adult Health

## 2011-04-21 VITALS — BP 120/81 | HR 120 | Temp 98.0°F | Ht 60.0 in | Wt 102.0 lb

## 2011-04-21 DIAGNOSIS — A609 Anogenital herpesviral infection, unspecified: Secondary | ICD-10-CM

## 2011-04-21 DIAGNOSIS — B009 Herpesviral infection, unspecified: Secondary | ICD-10-CM

## 2011-04-21 MED ORDER — VALACYCLOVIR HCL 1 G PO TABS: 1000.0000 mg | ORAL_TABLET | Freq: Two times a day (BID) | ORAL | Status: DC

## 2011-04-21 NOTE — Progress Notes (Signed)
  Subjective:    Patient ID: Laura Rogers, female    DOB: 1976-02-04, 35 y.o.   MRN: [id number]  HPI Presents with two-week history of pruritic, tender, excoriated, "sores" to her perianal area, and a new onset of some blistering to her outer labia. States that the sores break open and weep and become tender pruritic. Denies, fevers, chills, sweats. Endorses adherence to all her antiretrovirals as well as for prophylaxis medications. Does relate a high degree of anxiety, but denies having any significant amount of pain.   Review of Systems  Constitutional: Positive for fatigue. Negative for fever, chills and diaphoresis.  HENT: Negative.   Eyes: Negative.   Respiratory: Negative.   Cardiovascular: Negative.   Gastrointestinal: Negative.   Genitourinary: Positive for genital sores. Negative for dysuria, urgency, frequency, hematuria, flank pain, decreased urine volume, vaginal bleeding, vaginal discharge, enuresis, difficulty urinating, vaginal pain, menstrual problem and pelvic pain.  Skin: Positive for rash and wound.  Hematological: Negative.   Psychiatric/Behavioral: Positive for decreased concentration. The patient is nervous/anxious.        Objective:   Physical Exam  Constitutional: She is oriented to person, place, and time. She appears well-developed.       Underweight-appearing  HENT:  Head: Normocephalic and atraumatic.  Neck: Normal range of motion. Neck supple.  Abdominal: Soft. Bowel sounds are normal.  Genitourinary:     Musculoskeletal: Normal range of motion.  Neurological: She is alert and oriented to person, place, and time. No cranial nerve deficit.  Psychiatric: Her speech is normal and behavior is normal. Judgment normal. Her mood appears anxious. Her affect is blunt. Her affect is not angry and not inappropriate. Thought content is not paranoid and not delusional. Cognition and memory are normal. She expresses no suicidal ideation. She expresses no suicidal  plans and no homicidal plans.          Assessment & Plan:  1. Anogenital HSV. Val acyclovir 1.0 g, by mouth, twice a day x10 days. Anogenital HSV care provided both written and verbally. Instructed that if the lesions do not improve by the end of next week, she should contact the clinic for reevaluation. She should continue taking all her other medications as prescribed, and scheduled followup with Dr. Ninetta Lights on 05/10/2011. If lesions clear up, then reoccur. She should also contact the clinic for possible retreatment and suppressive therapy.  2. Depression with Anxiety. Supportive reassurance was provided for her and she was referred back to her bridge counselor for further support. She was instructed that should anxiety continue to increase or her depression worsens, she should contact the clinic for further evaluation and referral.  She verbally acknowledged all information that was provided to her and agreed with plan of care.

## 2011-04-21 NOTE — Patient Instructions (Addendum)
Herpes Genital herpes is a sexually transmitted disease. This means that it is a disease passed by having sex with an infected person. There is no cure for genital herpes. The time between attacks can be months to years. The virus may live in a person but produce no problems (symptoms). This infection can be passed to a baby as it travels down the birth canal (vagina). In a newborn, this can cause central nervous system damage, eye damage or even death. The virus that causes genital herpes is usually HSV-2 virus. The virus that causes oral herpes is usually HSV-1. The diagnosis (learning what is wrong) is made through culture results. SYMPTOMS Usually symptoms of pain and itching begin a couple days to a week after contact. It first appears as small blisters that progress to small painful ulcers which then scab over and heal after several days. It affects the outer genitalia, birth canal, cervix, penis, anal area, buttocks and thighs. HOME CARE INSTRUCTIONS  Keep ulcerated areas dry and clean.   Take medications as directed. Antiviral medications can speed up healing. They will not prevent recurrences or cure this infection. These medications can also be taken for suppression if there are frequent recurrences.   WARNING: While the infection is active, it is contagious. Avoid all sexual contact during active infections.   Condoms may help prevent spread of the herpes virus.   Practice safe sex.   Wash your hands thoroughly after touching the genital area.   Avoid touching your eyes after touching your genital area.   Inform your caregiver if you have had genital herpes and become pregnant. It is your responsibility to insure a safe outcome for your baby in this pregnancy.   Only take over-the-counter or prescription medicines for pain, discomfort, or fever as directed by your caregiver.  SEEK MEDICAL CARE IF:  You have a recurrence of this infection.   You do not respond to medications and  are not improving.   You have new sources of pain or discharge which have changed from the original infection.   You have an oral temperature above 102.   You develop abdominal pain.   You develop eye pain or signs of eye infection.  Document Released: 08/26/2000 Document Re-Released: 11/23/2009 [facility] Patient Information 2011 [place], [place].

## 2011-04-22 ENCOUNTER — Ambulatory Visit: Payer: Self-pay

## 2011-04-27 ENCOUNTER — Telehealth: Payer: Self-pay | Admitting: *Deleted

## 2011-04-27 NOTE — Telephone Encounter (Signed)
RN will speak with Dr. Ninetta Lights about these symptoms.  [name], RN.   RN spoke with Dr. Ninetta Lights.  Stated that color change is related to rash and should decrease with time.  Due to pt's anxiety will give her the tel # for Denyce Robert, mental health counselor to discuss her concerns.  Call back to pt to let her know Dr. Moshe Cipro response.  Pt has an appt already scheduled with Denyce Robert for next week.  [name], RN

## 2011-04-27 NOTE — Telephone Encounter (Signed)
States she stopped the valtrex 2 days ago due to swollen face & lips & hives. Now her legs are purple when she stands. Advised ED now and to never take the drug again. Added this to her med allergy list

## 2011-04-29 ENCOUNTER — Telehealth: Payer: Self-pay | Admitting: Licensed Clinical Social Worker

## 2011-04-29 NOTE — Telephone Encounter (Signed)
Patient called stating that her legs were still turning purple when she stood up, but went to normal color when she sat down. She stated that she had a severe allergy to Valtrex and she has been off of it for 3 days now. She did not complain of pain or weakness in legs. I advised her to be seen today in our clinic, but she could not make it and she stated that she was a lot better. I advised the patient that she needs an appointment and to seek medical assistance if symptoms worsen.

## 2011-05-09 ENCOUNTER — Encounter: Payer: Self-pay | Admitting: Infectious Diseases

## 2011-05-09 ENCOUNTER — Telehealth: Payer: Self-pay

## 2011-05-09 ENCOUNTER — Ambulatory Visit (INDEPENDENT_AMBULATORY_CARE_PROVIDER_SITE_OTHER): Payer: Medicaid Other | Admitting: Infectious Diseases

## 2011-05-09 VITALS — BP 138/88 | HR 103 | Temp 98.6°F | Ht 60.0 in | Wt 108.8 lb

## 2011-05-09 DIAGNOSIS — Z111 Encounter for screening for respiratory tuberculosis: Secondary | ICD-10-CM

## 2011-05-09 DIAGNOSIS — Z23 Encounter for immunization: Secondary | ICD-10-CM

## 2011-05-09 DIAGNOSIS — B20 Human immunodeficiency virus [HIV] disease: Secondary | ICD-10-CM

## 2011-05-09 MED ORDER — EFAVIRENZ-EMTRICITAB-TENOFOVIR 600-200-300 MG PO TABS: 1.0000 | ORAL_TABLET | Freq: Every day | ORAL | Status: DC

## 2011-05-09 NOTE — Progress Notes (Signed)
  Subjective:    Patient ID: Laura Rogers, female    DOB: 04/23/1976, 35 y.o.   MRN: [id number]  HPI 35 yo F with hx of admission July 2012 to [facility] with pneumonia. She had received po anbx prior to adm and while in hospital was noted to have thrush. Her pneumonia did not improve initially. She was diagnosed with AIDS (CD4 20 and VL 190k) and presumptively dx with PCP (her sputum DFA was negative). She was treated with bactrim and prednisone. Has boyfriend of 3 years, he states he had negative test 2 years ago. She states that he has been retested since her positive test and that he is (-). She was started on atripla when she followed up in the ID office. She was since seen for a HSV outbreak, given valtrex. She developed a rash to this and it was d/c'ed.  Has gained wt since last visit. Still has slight rash where her HSV was but the ADR sx she had have resolved (lips and face swelling). Hives gone on arms, decreasing on legs. No problems with atripla. Has missed dose last night when she fell asleep. Has been having some night sweats.     Review of Systems  Constitutional: Negative for fever and chills.  Respiratory: Negative for cough and shortness of breath.   Gastrointestinal: Negative for diarrhea and constipation.  Genitourinary: Negative for dysuria.  Hematological: Negative for adenopathy.       Objective:   Physical Exam  Constitutional: She appears well-developed and well-nourished.  Eyes: EOM are normal. Pupils are equal, round, and reactive to light.  Neck: Neck supple.  Cardiovascular: Normal rate, regular rhythm and normal heart sounds.   Pulmonary/Chest: Effort normal and breath sounds normal. No respiratory distress.  Abdominal: Soft. Bowel sounds are normal. There is no tenderness. There is no rebound.          Assessment & Plan:

## 2011-05-09 NOTE — Assessment & Plan Note (Signed)
She is doing well, gets flu shot today. Will refill her Rx, she will meet with [facility] as well. Offered condoms.

## 2011-05-10 LAB — T-HELPER CELL (CD4) - (RCID CLINIC ONLY): CD4 T Cell Abs: 70 uL — ABNORMAL LOW (ref 400–2700)

## 2011-05-10 NOTE — Telephone Encounter (Signed)
Told her the [facility] has been called in

## 2011-05-11 LAB — HIV-1 RNA ULTRAQUANT REFLEX TO GENTYP+: HIV-1 RNA Quant, Log: 2.55 {Log} — ABNORMAL HIGH (ref <1.30)

## 2011-05-18 ENCOUNTER — Telehealth: Payer: Self-pay | Admitting: *Deleted

## 2011-05-18 NOTE — Telephone Encounter (Signed)
States she has developed r knee pain . She is about to take some ibuprofen. Refuses to see anyone but Dr. Ninetta Lights. Advised elevating when watching tv . States it hurts to touch it when elevated. Urged her to reconsider & get seen sooner. She declined. Told her to call back if she changes her mind. May use ED or UC if she decides to be seen when we are closed

## 2011-05-23 NOTE — Progress Notes (Signed)
Makia phoned this morning c/o a worsening rash to her face and chest. She feels that it may be caused by the smx/tmp she is taking for prophylaxis. She asked to be seen in clinic and I arranged the appt for 05/24/2011 via the schedulers. Carolin Guernsey, Bridge Counselor (09.10.2012, 12:30)

## 2011-05-24 ENCOUNTER — Ambulatory Visit (INDEPENDENT_AMBULATORY_CARE_PROVIDER_SITE_OTHER): Payer: Medicaid Other | Admitting: Adult Health

## 2011-05-24 ENCOUNTER — Encounter: Payer: Self-pay | Admitting: Adult Health

## 2011-05-24 DIAGNOSIS — Z21 Asymptomatic human immunodeficiency virus [HIV] infection status: Secondary | ICD-10-CM

## 2011-05-24 DIAGNOSIS — F411 Generalized anxiety disorder: Secondary | ICD-10-CM

## 2011-05-24 DIAGNOSIS — L299 Pruritus, unspecified: Secondary | ICD-10-CM

## 2011-05-24 DIAGNOSIS — F419 Anxiety disorder, unspecified: Secondary | ICD-10-CM

## 2011-05-24 DIAGNOSIS — B20 Human immunodeficiency virus [HIV] disease: Secondary | ICD-10-CM

## 2011-05-24 DIAGNOSIS — L739 Follicular disorder, unspecified: Secondary | ICD-10-CM

## 2011-05-24 DIAGNOSIS — L738 Other specified follicular disorders: Secondary | ICD-10-CM

## 2011-05-24 MED ORDER — DOXYCYCLINE HYCLATE 100 MG PO TABS: 100.0000 mg | ORAL_TABLET | Freq: Two times a day (BID) | ORAL | Status: DC

## 2011-05-24 MED ORDER — ALPRAZOLAM 0.25 MG PO TABS: 0.2500 mg | ORAL_TABLET | Freq: Two times a day (BID) | ORAL | Status: DC

## 2011-05-24 MED ORDER — HYDROXYZINE HCL 10 MG PO TABS: 10.0000 mg | ORAL_TABLET | Freq: Three times a day (TID) | ORAL | Status: AC | PRN

## 2011-05-24 MED ORDER — DAPSONE 100 MG PO TABS: 100.0000 mg | ORAL_TABLET | Freq: Every day | ORAL | Status: DC

## 2011-05-27 ENCOUNTER — Ambulatory Visit: Payer: Self-pay

## 2011-05-30 ENCOUNTER — Other Ambulatory Visit: Payer: Self-pay | Admitting: *Deleted

## 2011-05-30 ENCOUNTER — Encounter: Payer: Self-pay | Admitting: Adult Health

## 2011-05-30 DIAGNOSIS — A609 Anogenital herpesviral infection, unspecified: Secondary | ICD-10-CM

## 2011-05-30 DIAGNOSIS — B20 Human immunodeficiency virus [HIV] disease: Secondary | ICD-10-CM

## 2011-05-30 MED ORDER — DAPSONE 100 MG PO TABS: 100.0000 mg | ORAL_TABLET | Freq: Every day | ORAL | Status: AC

## 2011-05-30 MED ORDER — EFAVIRENZ-EMTRICITAB-TENOFOVIR 600-200-300 MG PO TABS: 1.0000 | ORAL_TABLET | Freq: Every day | ORAL | Status: DC

## 2011-05-30 NOTE — Progress Notes (Signed)
Pt's file for bridge counseling was closed today. Pt has been referred to [facility] for ongoing case management services.

## 2011-06-07 ENCOUNTER — Other Ambulatory Visit: Payer: Self-pay | Admitting: *Deleted

## 2011-06-07 ENCOUNTER — Telehealth: Payer: Self-pay | Admitting: *Deleted

## 2011-06-07 NOTE — Telephone Encounter (Signed)
Patient is having a herpes outbreak and she has developed an allergy to Valtrex.  She is requesting something else prescribed and sent to [facility] on [address]. Wendall Mola CMA

## 2011-06-08 ENCOUNTER — Telehealth: Payer: Self-pay | Admitting: *Deleted

## 2011-06-08 ENCOUNTER — Other Ambulatory Visit: Payer: Self-pay | Admitting: *Deleted

## 2011-06-08 DIAGNOSIS — B009 Herpesviral infection, unspecified: Secondary | ICD-10-CM

## 2011-06-08 NOTE — Telephone Encounter (Signed)
Pt outbreak of itching rash on shoulders.  Previously seen by B. Sundra Aland, NP for this problem on her chest.  Completed doxycycline rx.  Atarax rx did not help with itching.  Pt started OTC Benadryl which helps her sleep but she doesn't like to take it during the daytime.  RN scheduled pt for f/u appt with Dr. Ninetta Lights to have problem evaluated and discuss HIV medications.  Pt also scheduled in PAP smear clinic.  [name], RN

## 2011-06-08 NOTE — Telephone Encounter (Signed)
Pt returned a call & asked that she be called back. Ok to leave voicemail

## 2011-06-08 NOTE — Telephone Encounter (Signed)
Message left.  Pt informed that there is no other medication for a herpes outbreak per Dr. Ninetta Lights.  Pt advised to use tepid baths, no "feminine wipes" which may cause burning and gentle cleansing with water.  Encouraged to call Center back if needed.   [name], RN

## 2011-06-09 NOTE — Telephone Encounter (Signed)
[name] RN spoke with patient and per Dr. Ninetta Lights due to patient believing she has an allergy to Valtrex there is not anything he can prescribe.  After speaking with patient Laura Rogers stated it appeared patient may not be having a herpes breakout after all. Wendall Mola CMA

## 2011-06-17 ENCOUNTER — Ambulatory Visit: Payer: Medicaid Other | Admitting: Infectious Diseases

## 2011-06-17 ENCOUNTER — Telehealth: Payer: Self-pay | Admitting: *Deleted

## 2011-06-17 NOTE — Telephone Encounter (Signed)
She does not want to go to a [facility].Wants an appt here Monday since she has had this for a month. States she will "just keep taking benadryl". Transferred to front desk

## 2011-06-17 NOTE — Telephone Encounter (Signed)
C/o itchy rash. "little red bumps. Located on lower abd"under bra" & some on shoulder. Wanted med called in. Told her she would need to be seen. We have no appts left for today. She does not have a PCP. Since she has medicaid, urged her to establish with one for problems that are not HIV related. Told her they would have more appts available. She will consider this. Meantime urged her to go to [facility] or [facility] care to have the rash looked at. She agreed

## 2011-06-20 ENCOUNTER — Ambulatory Visit: Payer: Medicaid Other | Admitting: Infectious Disease

## 2011-07-08 ENCOUNTER — Ambulatory Visit (INDEPENDENT_AMBULATORY_CARE_PROVIDER_SITE_OTHER): Payer: Medicaid Other | Admitting: Infectious Diseases

## 2011-07-08 ENCOUNTER — Other Ambulatory Visit: Payer: Self-pay | Admitting: Infectious Diseases

## 2011-07-08 ENCOUNTER — Other Ambulatory Visit: Payer: Medicaid Other

## 2011-07-08 ENCOUNTER — Other Ambulatory Visit (HOSPITAL_COMMUNITY)
Admission: RE | Admit: 2011-07-08 | Discharge: 2011-07-08 | Disposition: A | Discharge status: Home / Self Care | Payer: Medicaid Other | Source: Ambulatory Visit | Attending: Infectious Diseases | Admitting: Infectious Diseases

## 2011-07-08 DIAGNOSIS — Z1159 Encounter for screening for other viral diseases: Secondary | ICD-10-CM | POA: Insufficient documentation

## 2011-07-08 DIAGNOSIS — Z01419 Encounter for gynecological examination (general) (routine) without abnormal findings: Secondary | ICD-10-CM | POA: Insufficient documentation

## 2011-07-08 DIAGNOSIS — B20 Human immunodeficiency virus [HIV] disease: Secondary | ICD-10-CM

## 2011-07-08 DIAGNOSIS — Z124 Encounter for screening for malignant neoplasm of cervix: Secondary | ICD-10-CM

## 2011-07-08 LAB — COMPREHENSIVE METABOLIC PANEL
ALT: 16 U/L (ref 0–35)
AST: 15 U/L (ref 0–37)
Albumin: 4.4 g/dL (ref 3.5–5.2)
Alkaline Phosphatase: 70 U/L (ref 39–117)
BUN: 11 mg/dL (ref 6–23)
Potassium: 4.1 mEq/L (ref 3.5–5.3)
Sodium: 135 mEq/L (ref 135–145)
Total Protein: 7.4 g/dL (ref 6.0–8.3)

## 2011-07-08 LAB — CBC WITH DIFFERENTIAL/PLATELET
Hemoglobin: 12.5 g/dL (ref 12.0–15.0)
Lymphocytes Relative: 33 % (ref 12–46)
Lymphs Abs: 1.8 10*3/uL (ref 0.7–4.0)
Monocytes Relative: 9 % (ref 3–12)
Neutro Abs: 3.2 10*3/uL (ref 1.7–7.7)
Neutrophils Relative %: 56 % (ref 43–77)
Platelets: 253 10*3/uL (ref 150–400)
RBC: 3.86 MIL/uL — ABNORMAL LOW (ref 3.87–5.11)
WBC: 5.6 10*3/uL (ref 4.0–10.5)

## 2011-07-08 NOTE — Patient Instructions (Signed)
  Your results will be ready in about a week.  I will mail them to you.  Thank you for coming to the Center for your care.  Laura Rogers 

## 2011-07-12 LAB — HIV-1 RNA QUANT-NO REFLEX-BLD
HIV 1 RNA Quant: 22 copies/mL — ABNORMAL HIGH (ref <20)
HIV-1 RNA Quant, Log: 1.34 {Log} — ABNORMAL HIGH (ref <1.30)

## 2011-07-13 ENCOUNTER — Other Ambulatory Visit: Payer: Medicaid Other

## 2011-07-14 ENCOUNTER — Telehealth: Payer: Self-pay | Admitting: *Deleted

## 2011-07-14 NOTE — Telephone Encounter (Signed)
She wanted to know results of labs drawn last visit. I gave her the T cells & viral load. She is improving her numbers. I congratulated her on taking the meds. She was very pleased

## 2011-07-18 NOTE — Progress Notes (Signed)
  Pt needs GYN eval for abn PAP

## 2011-07-19 ENCOUNTER — Telehealth: Payer: Self-pay | Admitting: *Deleted

## 2011-07-19 NOTE — Telephone Encounter (Signed)
Called patient and notified her of appointment at [facility] clinic for abnormal Pap.  Patient was very concerned and said she would go to the appointment, but will find her own GYN for future pap smears. Wendall Mola CMA

## 2011-07-20 ENCOUNTER — Other Ambulatory Visit: Payer: Medicaid Other

## 2011-07-27 ENCOUNTER — Ambulatory Visit: Payer: Medicaid Other | Admitting: Infectious Diseases

## 2011-08-03 ENCOUNTER — Ambulatory Visit: Payer: Medicaid Other | Admitting: Infectious Diseases

## 2011-08-24 ENCOUNTER — Ambulatory Visit: Payer: Self-pay | Admitting: Infectious Diseases

## 2011-08-24 ENCOUNTER — Encounter: Payer: Medicaid Other | Admitting: Advanced Practice Midwife

## 2011-08-25 ENCOUNTER — Telehealth: Payer: Self-pay | Admitting: *Deleted

## 2011-08-25 ENCOUNTER — Encounter: Payer: Medicaid Other | Admitting: Family Medicine

## 2011-08-25 NOTE — Telephone Encounter (Signed)
Received call from Dr. Adrian Blackwater at [facility] he cancelled patient's appointment for today stating he did not feel she needed to be seen for abnormal pap, since there was no HPV detected.  He recommended pap be repeated in one year. Wendall Mola CMA

## 2011-09-26 ENCOUNTER — Telehealth: Payer: Self-pay | Admitting: Obstetrics and Gynecology

## 2011-09-26 ENCOUNTER — Ambulatory Visit (INDEPENDENT_AMBULATORY_CARE_PROVIDER_SITE_OTHER): Payer: Medicaid Other | Admitting: Infectious Diseases

## 2011-09-26 ENCOUNTER — Telehealth: Payer: Self-pay | Admitting: *Deleted

## 2011-09-26 ENCOUNTER — Encounter: Payer: Self-pay | Admitting: Infectious Diseases

## 2011-09-26 DIAGNOSIS — R87610 Atypical squamous cells of undetermined significance on cytologic smear of cervix (ASC-US): Secondary | ICD-10-CM

## 2011-09-26 DIAGNOSIS — F419 Anxiety disorder, unspecified: Secondary | ICD-10-CM

## 2011-09-26 DIAGNOSIS — Z79899 Other long term (current) drug therapy: Secondary | ICD-10-CM

## 2011-09-26 DIAGNOSIS — Z113 Encounter for screening for infections with a predominantly sexual mode of transmission: Secondary | ICD-10-CM

## 2011-09-26 DIAGNOSIS — IMO0001 Reserved for inherently not codable concepts without codable children: Secondary | ICD-10-CM

## 2011-09-26 DIAGNOSIS — B20 Human immunodeficiency virus [HIV] disease: Secondary | ICD-10-CM

## 2011-09-26 DIAGNOSIS — F411 Generalized anxiety disorder: Secondary | ICD-10-CM

## 2011-09-26 DIAGNOSIS — R87619 Unspecified abnormal cytological findings in specimens from cervix uteri: Secondary | ICD-10-CM | POA: Insufficient documentation

## 2011-09-26 LAB — LIPID PANEL
Cholesterol: 200 mg/dL (ref 0–200)
Triglycerides: 144 mg/dL (ref <150)
VLDL: 29 mg/dL (ref 0–40)

## 2011-09-26 LAB — COMPREHENSIVE METABOLIC PANEL
BUN: 8 mg/dL (ref 6–23)
CO2: 28 mEq/L (ref 19–32)
Glucose, Bld: 91 mg/dL (ref 70–99)
Sodium: 135 mEq/L (ref 135–145)
Total Bilirubin: 0.2 mg/dL — ABNORMAL LOW (ref 0.3–1.2)
Total Protein: 7.3 g/dL (ref 6.0–8.3)

## 2011-09-26 MED ORDER — ALPRAZOLAM 0.25 MG PO TABS: 0.2500 mg | ORAL_TABLET | Freq: Two times a day (BID) | ORAL | Status: DC

## 2011-09-26 NOTE — Assessment & Plan Note (Signed)
She is doing well. Given condoms. Vaccines are up to date. Will recheck her labs today. Hopefully can stop dapsone. rtc 4-5 months

## 2011-09-26 NOTE — Progress Notes (Signed)
  Subjective:    Patient ID: Laura Rogers, female    DOB: [birth year], 36 y.o.   MRN: [id number]  HPI 36 yo F with hx of admission July 2012 to [facility] with pneumonia. She had received po anbx prior to adm and while in hospital was noted to have thrush. Her pneumonia did not improve initially. She was diagnosed with AIDS (CD4 20 and VL 190k) and presumptively dx with PCP (her sputum DFA was negative). She was treated with bactrim and prednisone. She has had ADR to bactrim since then.  She was started on atripla when she followed up in the ID office. She has also been seen for HSV outbreak, had ADR to valtrex as well.  Last CD4 170 and VL 22 (07-08-11). She had ASCU pap 07-08-11.     Review of Systems  Constitutional: Negative for appetite change and unexpected weight change.  Respiratory:       Has some SOB at night time. Has AM congestion.   Gastrointestinal: Negative for diarrhea and constipation.  Genitourinary: Negative for dysuria.  Neurological: Negative for headaches.  Hematological: Negative for adenopathy.       Objective:   Physical Exam  Constitutional: She appears well-developed and well-nourished.  HENT:  Head: Normocephalic.  Mouth/Throat: No oropharyngeal exudate.  Eyes: EOM are normal. Pupils are equal, round, and reactive to light.  Neck: Neck supple.  Cardiovascular: Normal rate, regular rhythm and normal heart sounds.   Pulmonary/Chest: Effort normal and breath sounds normal.  Abdominal: Soft. Bowel sounds are normal. There is no tenderness.  Lymphadenopathy:    She has no cervical adenopathy.          Assessment & Plan:

## 2011-09-26 NOTE — Assessment & Plan Note (Signed)
Would like her xanax refilled.

## 2011-09-26 NOTE — Telephone Encounter (Signed)
Patient needs repeat PAP in April 2013

## 2011-09-26 NOTE — Telephone Encounter (Signed)
Dr. Moshe Cipro nurse called stating that patient needed to be seen by this clinic because of ASCUS result from pap smear performed 07/08/2011. Advised of Dr. Denyce Robert note that patient cannot be seen for Colposcopy and recommended to repeat pap in one year. Dr. Macon Large also advised the same thing today. Dr. Moshe Cipro nurse states understanding and she will then send the patient's referral in October of this year for a repeat pap smear.

## 2011-09-27 LAB — T-HELPER CELL (CD4) - (RCID CLINIC ONLY): CD4 % Helper T Cell: 11 % — ABNORMAL LOW (ref 33–55)

## 2011-09-27 LAB — RPR

## 2011-09-27 LAB — CBC
HCT: 37.8 % (ref 36.0–46.0)
Hemoglobin: 13 g/dL (ref 12.0–15.0)
MCH: 32.8 pg (ref 26.0–34.0)
MCHC: 34.4 g/dL (ref 30.0–36.0)

## 2011-11-17 ENCOUNTER — Other Ambulatory Visit: Payer: Self-pay | Admitting: Adult Health

## 2011-11-17 ENCOUNTER — Other Ambulatory Visit: Payer: Self-pay | Admitting: Licensed Clinical Social Worker

## 2011-11-17 DIAGNOSIS — B20 Human immunodeficiency virus [HIV] disease: Secondary | ICD-10-CM

## 2011-11-17 MED ORDER — EFAVIRENZ-EMTRICITAB-TENOFOVIR 600-200-300 MG PO TABS: 1.0000 | ORAL_TABLET | Freq: Every day | ORAL | Status: DC

## 2011-11-23 ENCOUNTER — Telehealth: Payer: Self-pay | Admitting: *Deleted

## 2011-11-23 ENCOUNTER — Other Ambulatory Visit: Payer: Medicaid Other

## 2011-11-23 DIAGNOSIS — B20 Human immunodeficiency virus [HIV] disease: Secondary | ICD-10-CM

## 2011-11-23 NOTE — Telephone Encounter (Signed)
Pt has been smoking marijuana to improve appetite because she explained her HIV medications upset her stomach and she doesn't eat very much.  A friend of hers gave her some Marinol that she took with good response.  RN reminded her that use of another patient's medication is inappropriate.  The patient stated that was why she was calling to ask for a rx.  RN advised that pt come in for a visit w/ Dr. Ninetta Lights to discuss this request.  Pt's future lab and MD visits rescheduled.

## 2011-11-24 ENCOUNTER — Telehealth: Payer: Self-pay | Admitting: *Deleted

## 2011-11-24 LAB — CBC WITH DIFFERENTIAL/PLATELET
HCT: 37 % (ref 36.0–46.0)
Hemoglobin: 12.7 g/dL (ref 12.0–15.0)
Lymphocytes Relative: 28 % (ref 12–46)
Monocytes Absolute: 0.4 10*3/uL (ref 0.1–1.0)
Monocytes Relative: 8 % (ref 3–12)
Neutro Abs: 3.3 10*3/uL (ref 1.7–7.7)
WBC: 5.4 10*3/uL (ref 4.0–10.5)

## 2011-11-24 LAB — COMPREHENSIVE METABOLIC PANEL
ALT: 15 U/L (ref 0–35)
CO2: 23 mEq/L (ref 19–32)
Calcium: 9.1 mg/dL (ref 8.4–10.5)
Chloride: 103 mEq/L (ref 96–112)
Sodium: 136 mEq/L (ref 135–145)
Total Bilirubin: 0.2 mg/dL — ABNORMAL LOW (ref 0.3–1.2)
Total Protein: 7.6 g/dL (ref 6.0–8.3)

## 2011-11-24 NOTE — Telephone Encounter (Signed)
Patient called because she was worried bout upcoming visit with provider. Advised her to be open and just let him know what she needs and why and he may work with her. Advised her to call if anything changes before visit.

## 2011-11-25 LAB — T-HELPER CELL (CD4) - (RCID CLINIC ONLY): CD4 T Cell Abs: 180 uL — ABNORMAL LOW (ref 400–2700)

## 2011-11-28 LAB — HIV-1 RNA QUANT-NO REFLEX-BLD: HIV-1 RNA Quant, Log: 1.48 {Log} — ABNORMAL HIGH (ref <1.30)

## 2011-11-29 ENCOUNTER — Ambulatory Visit (INDEPENDENT_AMBULATORY_CARE_PROVIDER_SITE_OTHER): Payer: Medicaid Other | Admitting: Infectious Diseases

## 2011-11-29 ENCOUNTER — Telehealth: Payer: Self-pay | Admitting: *Deleted

## 2011-11-29 ENCOUNTER — Encounter: Payer: Self-pay | Admitting: Infectious Diseases

## 2011-11-29 VITALS — BP 134/85 | HR 120 | Temp 97.9°F | Ht 61.0 in | Wt 124.0 lb

## 2011-11-29 DIAGNOSIS — B20 Human immunodeficiency virus [HIV] disease: Secondary | ICD-10-CM

## 2011-11-29 DIAGNOSIS — R87610 Atypical squamous cells of undetermined significance on cytologic smear of cervix (ASC-US): Secondary | ICD-10-CM

## 2011-11-29 MED ORDER — DRONABINOL 5 MG PO CAPS: 5.0000 mg | ORAL_CAPSULE | Freq: Two times a day (BID) | ORAL | Status: AC

## 2011-11-29 NOTE — Telephone Encounter (Signed)
I called the prior auth line for medicaid & asked for an approval

## 2011-11-29 NOTE — Progress Notes (Signed)
  Subjective:    Patient ID: Laura Rogers, female    DOB: 1976-08-04, 36 y.o.   MRN: [id number]  HPI 36 yo F with HIV+, states that being on atripla made her feel sick. She started smoking marijuana to help her with this. She has had some chest congestion since doing this. Took rx of marinol from her friend and thought it worked well.  Also worried that she has to take drug take drug test for work . Wants rx for marinol.  HIV 1 RNA Quant (copies/mL)  Date Value  11/23/2011 30*  09/26/2011 <20   07/08/2011 22*     CD4 T Cell Abs (cmm)  Date Value  11/23/2011 180*  09/26/2011 200*  07/08/2011 170*      Review of Systems     Objective:   Physical Exam  Constitutional: She appears well-developed and well-nourished.  Eyes: EOM are normal. Pupils are equal, round, and reactive to light.  Neck: Neck supple.  Cardiovascular: Normal rate, regular rhythm and normal heart sounds.   Pulmonary/Chest: Effort normal and breath sounds normal.  Abdominal: Soft. Bowel sounds are normal. She exhibits no distension. There is no tenderness.  Lymphadenopathy:    She has no cervical adenopathy.          Assessment & Plan:

## 2011-11-29 NOTE — Assessment & Plan Note (Addendum)
She is doing ok. Her CD4 is still below 200. Will hold on bactrim as she has an allergy. Has been taking dapsone irregularly. Hopefully will go back over 200. Offered/ given condoms. rx for marinol. vax up to date.

## 2011-11-29 NOTE — Assessment & Plan Note (Signed)
For repeat PAP. Will schedule with Angelique Blonder.

## 2011-11-30 NOTE — Telephone Encounter (Signed)
I spoke with a rep who asked the pharmacist about this. Pt must try & fail promethizine or zofran.  I explained it was for more than nausea. She still declined it

## 2011-12-02 ENCOUNTER — Ambulatory Visit: Payer: Medicaid Other | Admitting: Infectious Diseases

## 2011-12-19 ENCOUNTER — Other Ambulatory Visit: Payer: Medicaid Other

## 2012-01-02 ENCOUNTER — Ambulatory Visit: Payer: Medicaid Other | Admitting: Infectious Diseases

## 2012-02-02 ENCOUNTER — Telehealth: Payer: Self-pay | Admitting: *Deleted

## 2012-02-02 NOTE — Telephone Encounter (Signed)
Message left asking pt to call to talk with this RN.  Needs f/u PAP smear, previously ASCUS.  Needs f/u MD and Lab work for July, 2013 w/ Dr. Ninetta Lights.

## 2012-02-21 ENCOUNTER — Other Ambulatory Visit: Payer: Self-pay | Admitting: Adult Health

## 2012-03-08 ENCOUNTER — Other Ambulatory Visit: Payer: Self-pay | Admitting: *Deleted

## 2012-03-08 DIAGNOSIS — B20 Human immunodeficiency virus [HIV] disease: Secondary | ICD-10-CM

## 2012-03-08 MED ORDER — DRONABINOL 5 MG PO CAPS: 5.0000 mg | ORAL_CAPSULE | Freq: Two times a day (BID) | ORAL | Status: DC

## 2012-03-19 ENCOUNTER — Other Ambulatory Visit: Payer: Medicaid Other

## 2012-03-21 ENCOUNTER — Other Ambulatory Visit: Payer: Medicaid Other

## 2012-03-22 ENCOUNTER — Other Ambulatory Visit: Payer: Self-pay | Admitting: Infectious Diseases

## 2012-03-22 DIAGNOSIS — Z113 Encounter for screening for infections with a predominantly sexual mode of transmission: Secondary | ICD-10-CM

## 2012-03-24 ENCOUNTER — Other Ambulatory Visit: Payer: Self-pay | Admitting: Infectious Diseases

## 2012-03-26 ENCOUNTER — Other Ambulatory Visit (INDEPENDENT_AMBULATORY_CARE_PROVIDER_SITE_OTHER): Payer: Self-pay

## 2012-03-26 DIAGNOSIS — B20 Human immunodeficiency virus [HIV] disease: Secondary | ICD-10-CM

## 2012-03-26 LAB — CBC WITH DIFFERENTIAL/PLATELET
Basophils Relative: 1 % (ref 0–1)
Eosinophils Absolute: 0.2 10*3/uL (ref 0.0–0.7)
Eosinophils Relative: 4 % (ref 0–5)
MCH: 32.5 pg (ref 26.0–34.0)
MCHC: 36.3 g/dL — ABNORMAL HIGH (ref 30.0–36.0)
MCV: 89.5 fL (ref 78.0–100.0)
Neutrophils Relative %: 50 % (ref 43–77)
Platelets: 303 10*3/uL (ref 150–400)
RDW: 13 % (ref 11.5–15.5)

## 2012-03-26 LAB — COMPREHENSIVE METABOLIC PANEL
AST: 14 U/L (ref 0–37)
Albumin: 4.2 g/dL (ref 3.5–5.2)
Alkaline Phosphatase: 106 U/L (ref 39–117)
BUN: 9 mg/dL (ref 6–23)
Calcium: 9.1 mg/dL (ref 8.4–10.5)
Creat: 0.73 mg/dL (ref 0.50–1.10)
Glucose, Bld: 98 mg/dL (ref 70–99)
Potassium: 4.3 mEq/L (ref 3.5–5.3)

## 2012-03-27 LAB — T-HELPER CELL (CD4) - (RCID CLINIC ONLY): CD4 % Helper T Cell: 14 % — ABNORMAL LOW (ref 33–55)

## 2012-03-28 LAB — HIV-1 RNA QUANT-NO REFLEX-BLD: HIV 1 RNA Quant: <20 copies/mL (ref <20)

## 2012-04-09 ENCOUNTER — Encounter: Payer: Self-pay | Admitting: Infectious Diseases

## 2012-04-09 ENCOUNTER — Telehealth: Payer: Self-pay | Admitting: Infectious Diseases

## 2012-04-09 ENCOUNTER — Ambulatory Visit (INDEPENDENT_AMBULATORY_CARE_PROVIDER_SITE_OTHER): Payer: Medicaid Other | Admitting: Infectious Diseases

## 2012-04-09 VITALS — BP 134/88 | HR 91 | Temp 97.9°F | Ht 61.0 in | Wt 123.0 lb

## 2012-04-09 DIAGNOSIS — F419 Anxiety disorder, unspecified: Secondary | ICD-10-CM

## 2012-04-09 DIAGNOSIS — Z113 Encounter for screening for infections with a predominantly sexual mode of transmission: Secondary | ICD-10-CM

## 2012-04-09 DIAGNOSIS — Z79899 Other long term (current) drug therapy: Secondary | ICD-10-CM

## 2012-04-09 DIAGNOSIS — B20 Human immunodeficiency virus [HIV] disease: Secondary | ICD-10-CM

## 2012-04-09 DIAGNOSIS — R87610 Atypical squamous cells of undetermined significance on cytologic smear of cervix (ASC-US): Secondary | ICD-10-CM

## 2012-04-09 DIAGNOSIS — F411 Generalized anxiety disorder: Secondary | ICD-10-CM

## 2012-04-09 MED ORDER — ALPRAZOLAM 0.25 MG PO TABS: 0.5000 mg | ORAL_TABLET | Freq: Two times a day (BID) | ORAL | Status: DC

## 2012-04-09 NOTE — Assessment & Plan Note (Signed)
Encouraged her to be seen in GYN. She is willing. Has missed previous appts (due in April 2013).

## 2012-04-09 NOTE — Telephone Encounter (Signed)
Ms. Overholt came to see me wanting help getting Marinol.  She says she has no appetite at all.  I called [employer] to see if she can get it having Medicaid.  Was told she needs a letter or print-out from the [facility] program stating that the medication is not covered.  She will talk with her case worker today and see if she can get the letter.

## 2012-04-09 NOTE — Assessment & Plan Note (Signed)
Still having some dizziness with atripla. Hesitant to change to complera due to her prev low CD4. Await next generation integrase FDC. Offered/given condoms. Stop dapsonse (said she wasn't taking). Will see her back in 5-6 months.

## 2012-04-09 NOTE — Progress Notes (Signed)
  Subjective:    Patient ID: Laura Rogers, female    DOB: [birth year], 36 y.o.   MRN: [id number]  HPI 36 yo F with AIDS, abn pap. Has been taking atripla, has some occas Am nausea. Would like increase dose and refill of xanax. Having difficulty getting marinol due to $.   HIV 1 RNA Quant (copies/mL)  Date Value  03/26/2012 <20   11/23/2011 30*  09/26/2011 <20      CD4 T Cell Abs (cmm)  Date Value  03/26/2012 300*  11/23/2011 180*  09/26/2011 200*      Review of Systems     Objective:   Physical Exam  Constitutional: She appears well-developed and well-nourished.  HENT:  Mouth/Throat: No oropharyngeal exudate.  Eyes: EOM are normal. Pupils are equal, round, and reactive to light.  Neck: Neck supple.  Cardiovascular: Normal rate, regular rhythm and normal heart sounds.   Pulmonary/Chest: Effort normal and breath sounds normal.  Abdominal: Soft. Bowel sounds are normal. She exhibits no distension. There is no tenderness.  Musculoskeletal: She exhibits no edema.  Lymphadenopathy:    She has no cervical adenopathy.          Assessment & Plan:

## 2012-04-09 NOTE — Assessment & Plan Note (Signed)
Will refill her xanax increase dose to .5mg 

## 2012-04-17 ENCOUNTER — Other Ambulatory Visit: Payer: Self-pay | Admitting: Infectious Diseases

## 2012-04-26 ENCOUNTER — Encounter: Payer: Self-pay | Admitting: Obstetrics & Gynecology

## 2012-05-30 ENCOUNTER — Encounter: Payer: Self-pay | Admitting: Obstetrics & Gynecology

## 2012-06-07 ENCOUNTER — Other Ambulatory Visit: Payer: Self-pay | Admitting: Licensed Clinical Social Worker

## 2012-06-07 DIAGNOSIS — B20 Human immunodeficiency virus [HIV] disease: Secondary | ICD-10-CM

## 2012-06-07 MED ORDER — DRONABINOL 5 MG PO CAPS: 5.0000 mg | ORAL_CAPSULE | Freq: Two times a day (BID) | ORAL | Status: DC

## 2012-06-20 ENCOUNTER — Ambulatory Visit: Payer: Medicaid Other | Admitting: Obstetrics & Gynecology

## 2012-06-20 NOTE — Progress Notes (Signed)
Discussed with Dr.Anyanwu and patient referred for colposcopy based on 06/2011 pap smear- does not need coloposcopy today, needs papsmear on or after 1027/13. Patient voices understanding.

## 2012-06-21 ENCOUNTER — Telehealth: Payer: Self-pay | Admitting: *Deleted

## 2012-06-21 ENCOUNTER — Telehealth: Payer: Self-pay | Admitting: Infectious Diseases

## 2012-06-21 NOTE — Telephone Encounter (Signed)
Received a call from Ms. Laura Rogers.  She is needing help with her Atripla.  No longer has Medicaid?  I told her what to bring and will see her Wednesday, 10-16 at 11:45

## 2012-06-21 NOTE — Telephone Encounter (Signed)
Atripla patient assistance rep called to advise that the patient has initiated an application with them advised that her situation changed and she needs assistance paying for meds at this time. The PAP rep needed a number to call Medicaid to see if the patient was covered and to what end. She also advised they would be sending a form for [place] to fill out and they would need a Rx. Gave her the fax number and advised her to send the form attn Pam as she is the in office PAP person. Gave her [phone]

## 2012-06-28 ENCOUNTER — Telehealth: Payer: Self-pay | Admitting: Infectious Diseases

## 2012-06-28 NOTE — Telephone Encounter (Signed)
Called Atripla Patient Assistance to check status on Laura Rogers's application.  They are still unable to get a response from [facility] Medicaid on her status.

## 2012-07-04 ENCOUNTER — Other Ambulatory Visit: Payer: Self-pay | Admitting: *Deleted

## 2012-07-04 ENCOUNTER — Ambulatory Visit: Payer: Medicaid Other

## 2012-07-04 DIAGNOSIS — B20 Human immunodeficiency virus [HIV] disease: Secondary | ICD-10-CM

## 2012-07-04 MED ORDER — EFAVIRENZ-EMTRICITAB-TENOFOVIR 600-200-300 MG PO TABS: 1.0000 | ORAL_TABLET | Freq: Every day | ORAL | Status: DC

## 2012-07-06 ENCOUNTER — Ambulatory Visit (INDEPENDENT_AMBULATORY_CARE_PROVIDER_SITE_OTHER): Payer: Self-pay | Admitting: *Deleted

## 2012-07-06 DIAGNOSIS — Z124 Encounter for screening for malignant neoplasm of cervix: Secondary | ICD-10-CM

## 2012-07-06 NOTE — Progress Notes (Signed)
  Subjective:     Laura Rogers is a 36 y.o. woman who comes in today for a  pap smear only.  Previous abnormal Pap smears: yes. Contraception: condoms  Objective:    There were no vitals taken for this visit. Pelvic Exam:  Pap smear obtained.   Assessment:    Screening pap smear.   Plan:    Follow up in one year, or as indicated by Pap results.  Pt given educational materials re:  HIV and women, living with HIV over 50, BSE, nutrition, choloesterol and HIV, pap smears and self-esteem.  Pt given condoms and lubricant.

## 2012-07-06 NOTE — Patient Instructions (Signed)
  Your results will be ready in about a week.  I will mail them to you or you can look them up on My Chart.  Thank you for coming to the Center for your care.  Angelique Blonder, RN

## 2012-07-13 ENCOUNTER — Encounter: Payer: Self-pay | Admitting: *Deleted

## 2012-07-19 ENCOUNTER — Other Ambulatory Visit: Payer: Self-pay | Admitting: Licensed Clinical Social Worker

## 2012-07-19 DIAGNOSIS — B20 Human immunodeficiency virus [HIV] disease: Secondary | ICD-10-CM

## 2012-07-19 MED ORDER — EFAVIRENZ-EMTRICITAB-TENOFOVIR 600-200-300 MG PO TABS: 1.0000 | ORAL_TABLET | Freq: Every day | ORAL | Status: DC

## 2012-10-29 ENCOUNTER — Other Ambulatory Visit: Payer: Self-pay

## 2012-11-05 ENCOUNTER — Other Ambulatory Visit (INDEPENDENT_AMBULATORY_CARE_PROVIDER_SITE_OTHER): Payer: Self-pay

## 2012-11-05 ENCOUNTER — Other Ambulatory Visit: Payer: Self-pay | Admitting: Infectious Diseases

## 2012-11-05 DIAGNOSIS — B20 Human immunodeficiency virus [HIV] disease: Secondary | ICD-10-CM

## 2012-11-05 DIAGNOSIS — Z79899 Other long term (current) drug therapy: Secondary | ICD-10-CM

## 2012-11-05 DIAGNOSIS — Z113 Encounter for screening for infections with a predominantly sexual mode of transmission: Secondary | ICD-10-CM

## 2012-11-05 LAB — COMPREHENSIVE METABOLIC PANEL
AST: 18 U/L (ref 0–37)
Albumin: 4.3 g/dL (ref 3.5–5.2)
Alkaline Phosphatase: 110 U/L (ref 39–117)
Calcium: 8.9 mg/dL (ref 8.4–10.5)
Chloride: 106 mEq/L (ref 96–112)
Glucose, Bld: 92 mg/dL (ref 70–99)
Potassium: 4.6 mEq/L (ref 3.5–5.3)
Sodium: 137 mEq/L (ref 135–145)
Total Protein: 7.2 g/dL (ref 6.0–8.3)

## 2012-11-05 LAB — LIPID PANEL
Total CHOL/HDL Ratio: 5.5 Ratio
VLDL: 52 mg/dL — ABNORMAL HIGH (ref 0–40)

## 2012-11-05 LAB — CBC
HCT: 37.7 % (ref 36.0–46.0)
Hemoglobin: 13.3 g/dL (ref 12.0–15.0)
RBC: 4.21 MIL/uL (ref 3.87–5.11)
WBC: 4.1 10*3/uL (ref 4.0–10.5)

## 2012-11-05 LAB — RPR

## 2012-11-06 LAB — HIV-1 RNA QUANT-NO REFLEX-BLD
HIV 1 RNA Quant: <20 copies/mL (ref <20)
HIV-1 RNA Quant, Log: <1.3 {Log} (ref <1.30)

## 2012-11-07 LAB — T-HELPER CELL (CD4) - (RCID CLINIC ONLY)
CD4 % Helper T Cell: 22 % — ABNORMAL LOW (ref 33–55)
CD4 T Cell Abs: 430 uL (ref 400–2700)

## 2012-11-21 ENCOUNTER — Ambulatory Visit: Payer: Self-pay

## 2012-11-21 ENCOUNTER — Ambulatory Visit: Payer: Self-pay | Admitting: Infectious Diseases

## 2012-12-05 ENCOUNTER — Ambulatory Visit: Payer: Self-pay | Admitting: Infectious Diseases

## 2012-12-05 ENCOUNTER — Ambulatory Visit: Payer: Self-pay

## 2012-12-05 ENCOUNTER — Ambulatory Visit (INDEPENDENT_AMBULATORY_CARE_PROVIDER_SITE_OTHER): Payer: Self-pay | Admitting: Infectious Diseases

## 2012-12-05 ENCOUNTER — Encounter: Payer: Self-pay | Admitting: Infectious Diseases

## 2012-12-05 VITALS — BP 122/77 | HR 82 | Temp 98.1°F | Ht 60.0 in | Wt 131.0 lb

## 2012-12-05 DIAGNOSIS — E785 Hyperlipidemia, unspecified: Secondary | ICD-10-CM

## 2012-12-05 DIAGNOSIS — R6889 Other general symptoms and signs: Secondary | ICD-10-CM

## 2012-12-05 DIAGNOSIS — B20 Human immunodeficiency virus [HIV] disease: Secondary | ICD-10-CM

## 2012-12-05 DIAGNOSIS — F411 Generalized anxiety disorder: Secondary | ICD-10-CM

## 2012-12-05 DIAGNOSIS — F419 Anxiety disorder, unspecified: Secondary | ICD-10-CM

## 2012-12-05 MED ORDER — ALPRAZOLAM 0.25 MG PO TABS: 0.5000 mg | ORAL_TABLET | Freq: Two times a day (BID) | ORAL | Status: DC

## 2012-12-05 NOTE — Assessment & Plan Note (Signed)
Will refill her xanax, spoke to her about starting zoloft. wil re-visit.

## 2012-12-05 NOTE — Assessment & Plan Note (Signed)
She will start exercise, watch diet

## 2012-12-05 NOTE — Assessment & Plan Note (Addendum)
She is doing well. She is thinking about moving to [place]. She is given condoms. Has steady boyfriend. Check Hep B SAb. Will see her back in 6 months.

## 2012-12-05 NOTE — Progress Notes (Signed)
  Subjective:    Patient ID: Laura Rogers, female    DOB: [birth year], 37 y.o.   MRN: [id number]  HPI 37 yo F with hx AIDS, prev abn PAP (normal 10-13). Elevated trig at last blood draw.  Feels great. No problems with atripla.  Has rare headaches, improved with ibuprofen.  Needs xanax refill- not sure what makes her anxious.   HIV 1 RNA Quant (copies/mL)  Date Value  11/05/2012 <20   03/26/2012 <20   11/23/2011 30*     CD4 T Cell Abs (cmm)  Date Value  11/05/2012 430   03/26/2012 300*  11/23/2011 180*     Review of Systems  Constitutional: Negative for appetite change and unexpected weight change.  Gastrointestinal: Negative for diarrhea and constipation.  Genitourinary: Negative for difficulty urinating and menstrual problem.  Neurological: Positive for headaches. Negative for dizziness.       Objective:   Physical Exam  Constitutional: She appears well-developed and well-nourished.  HENT:  Mouth/Throat: No oropharyngeal exudate.  Eyes: EOM are normal. Pupils are equal, round, and reactive to light.  Neck: Neck supple.  Cardiovascular: Normal rate, regular rhythm and normal heart sounds.   Pulmonary/Chest: Effort normal and breath sounds normal.  Abdominal: Soft. Bowel sounds are normal. She exhibits no distension. There is no tenderness.  Musculoskeletal: She exhibits no edema.  Lymphadenopathy:    She has no cervical adenopathy.  Psychiatric: She has a normal mood and affect. Thought content normal.          Assessment & Plan:

## 2012-12-05 NOTE — Assessment & Plan Note (Signed)
Will have her get repeat this year.

## 2013-05-29 ENCOUNTER — Other Ambulatory Visit: Payer: Self-pay

## 2013-06-03 ENCOUNTER — Telehealth: Payer: Self-pay | Admitting: *Deleted

## 2013-06-03 DIAGNOSIS — B20 Human immunodeficiency virus [HIV] disease: Secondary | ICD-10-CM

## 2013-06-03 MED ORDER — DRONABINOL 5 MG PO CAPS: 5.0000 mg | ORAL_CAPSULE | Freq: Two times a day (BID) | ORAL | Status: DC

## 2013-06-03 NOTE — Telephone Encounter (Signed)
Phone call to pt to let her know that Marinol is covered by AD[employer] and the rx was called to [facility] in [place], [place] to fill.  [facility] will be calling her to arrange delivery.  Transferred the pt to B. Hammond's phone to set up a renewal appt for ADAP/RW.

## 2013-06-12 ENCOUNTER — Encounter: Payer: Self-pay | Admitting: Infectious Diseases

## 2013-06-12 ENCOUNTER — Ambulatory Visit: Payer: Medicare PPO

## 2013-06-12 ENCOUNTER — Ambulatory Visit (INDEPENDENT_AMBULATORY_CARE_PROVIDER_SITE_OTHER): Payer: Self-pay | Admitting: Infectious Diseases

## 2013-06-12 VITALS — BP 146/93 | HR 71 | Temp 98.4°F | Ht 63.0 in | Wt 127.0 lb

## 2013-06-12 DIAGNOSIS — R6889 Other general symptoms and signs: Secondary | ICD-10-CM

## 2013-06-12 DIAGNOSIS — Z79899 Other long term (current) drug therapy: Secondary | ICD-10-CM

## 2013-06-12 DIAGNOSIS — E785 Hyperlipidemia, unspecified: Secondary | ICD-10-CM

## 2013-06-12 DIAGNOSIS — B20 Human immunodeficiency virus [HIV] disease: Secondary | ICD-10-CM

## 2013-06-12 LAB — CBC WITH DIFFERENTIAL/PLATELET
Basophils Relative: 0 % (ref 0–1)
Eosinophils Absolute: 0.2 10*3/uL (ref 0.0–0.7)
MCH: 31.5 pg (ref 26.0–34.0)
MCHC: 35.7 g/dL (ref 30.0–36.0)
Neutrophils Relative %: 70 % (ref 43–77)
Platelets: 268 10*3/uL (ref 150–400)
RDW: 13.8 % (ref 11.5–15.5)

## 2013-06-12 LAB — LIPID PANEL
Cholesterol: 210 mg/dL — ABNORMAL HIGH (ref 0–200)
HDL: 45 mg/dL (ref >39)
LDL Cholesterol: 125 mg/dL — ABNORMAL HIGH (ref 0–99)
Triglycerides: 198 mg/dL — ABNORMAL HIGH (ref <150)

## 2013-06-12 LAB — COMPREHENSIVE METABOLIC PANEL
ALT: 14 U/L (ref 0–35)
Alkaline Phosphatase: 74 U/L (ref 39–117)
Sodium: 136 mEq/L (ref 135–145)
Total Bilirubin: 0.2 mg/dL — ABNORMAL LOW (ref 0.3–1.2)
Total Protein: 7.1 g/dL (ref 6.0–8.3)

## 2013-06-12 NOTE — Assessment & Plan Note (Signed)
Repeat 10-13 nl. Will send her for repeat.

## 2013-06-12 NOTE — Assessment & Plan Note (Signed)
She appears to be doing well. She does not want to change her meds. Will check her labs today including Hep B SAb. Offered/given condoms. She does NOT want flu shot. Advised her against taking dapsone prn.

## 2013-06-12 NOTE — Progress Notes (Signed)
  Subjective:    Patient ID: Laura Rogers, female    DOB: 1976/02/02, 36 y.o.   MRN: [id number]  HPI 37 yo F with hx of AIDS, on atripla. Also prev abn PAP (has had repeat nl Oct 2013).  Was off her meds for a couple of months. Meds made her "woozy". Does not want to change her meds though, now she is back on and denies problems.    HIV 1 RNA Quant (copies/mL)  Date Value  11/05/2012 <20   03/26/2012 <20   11/23/2011 30*     CD4 T Cell Abs (cmm)  Date Value  11/05/2012 430   03/26/2012 300*  11/23/2011 180*    Review of Systems  Constitutional: Negative for appetite change and unexpected weight change.  Respiratory: Negative for cough and shortness of breath.   Gastrointestinal: Negative for diarrhea and constipation.  Genitourinary: Negative for dysuria.       Objective:   Physical Exam  Constitutional: She appears well-developed and well-nourished.  HENT:  Mouth/Throat: No oropharyngeal exudate.  Eyes: EOM are normal. Pupils are equal, round, and reactive to light.  Neck: Neck supple.  Cardiovascular: Normal rate, regular rhythm and normal heart sounds.   Pulmonary/Chest: Effort normal and breath sounds normal.  Abdominal: Soft. Bowel sounds are normal. She exhibits no distension. There is no tenderness.  Lymphadenopathy:    She has no cervical adenopathy.          Assessment & Plan:

## 2013-06-12 NOTE — Assessment & Plan Note (Signed)
She is watching her diet, trying to loose wt. Will recheck her lipids today.

## 2013-06-13 LAB — HIV-1 RNA ULTRAQUANT REFLEX TO GENTYP+: HIV-1 RNA Quant, Log: <1.3 {Log} (ref <1.30)

## 2013-06-14 ENCOUNTER — Other Ambulatory Visit: Payer: Self-pay | Admitting: *Deleted

## 2013-06-14 DIAGNOSIS — F419 Anxiety disorder, unspecified: Secondary | ICD-10-CM

## 2013-06-14 MED ORDER — ALPRAZOLAM 0.25 MG PO TABS: 0.5000 mg | ORAL_TABLET | Freq: Two times a day (BID) | ORAL | Status: DC

## 2013-06-14 NOTE — Telephone Encounter (Deleted)
Last

## 2013-07-18 ENCOUNTER — Other Ambulatory Visit: Payer: Self-pay

## 2013-07-23 ENCOUNTER — Other Ambulatory Visit: Payer: Self-pay | Admitting: Licensed Clinical Social Worker

## 2013-07-23 DIAGNOSIS — B20 Human immunodeficiency virus [HIV] disease: Secondary | ICD-10-CM

## 2013-07-23 MED ORDER — EFAVIRENZ-EMTRICITAB-TENOFOVIR 600-200-300 MG PO TABS: 1.0000 | ORAL_TABLET | Freq: Every day | ORAL | Status: DC

## 2013-07-24 ENCOUNTER — Other Ambulatory Visit: Payer: Self-pay | Admitting: *Deleted

## 2013-07-24 DIAGNOSIS — B20 Human immunodeficiency virus [HIV] disease: Secondary | ICD-10-CM

## 2013-07-24 MED ORDER — EFAVIRENZ-EMTRICITAB-TENOFOVIR 600-200-300 MG PO TABS: 1.0000 | ORAL_TABLET | Freq: Every day | ORAL | Status: DC

## 2013-12-09 ENCOUNTER — Other Ambulatory Visit: Payer: Self-pay | Admitting: Infectious Diseases

## 2013-12-10 ENCOUNTER — Other Ambulatory Visit: Payer: Self-pay | Admitting: *Deleted

## 2013-12-10 DIAGNOSIS — B20 Human immunodeficiency virus [HIV] disease: Secondary | ICD-10-CM

## 2013-12-10 MED ORDER — DRONABINOL 5 MG PO CAPS: 5.0000 mg | ORAL_CAPSULE | Freq: Two times a day (BID) | ORAL | Status: DC

## 2013-12-12 ENCOUNTER — Other Ambulatory Visit: Payer: Self-pay

## 2013-12-20 ENCOUNTER — Encounter: Payer: Self-pay | Admitting: *Deleted

## 2014-01-09 ENCOUNTER — Other Ambulatory Visit: Payer: Self-pay

## 2014-01-16 ENCOUNTER — Other Ambulatory Visit: Payer: Medicare PPO

## 2014-01-16 DIAGNOSIS — Z113 Encounter for screening for infections with a predominantly sexual mode of transmission: Secondary | ICD-10-CM

## 2014-01-16 DIAGNOSIS — B20 Human immunodeficiency virus [HIV] disease: Secondary | ICD-10-CM

## 2014-01-16 DIAGNOSIS — Z79899 Other long term (current) drug therapy: Secondary | ICD-10-CM

## 2014-01-16 LAB — LIPID PANEL
CHOL/HDL RATIO: 4.8 ratio
Cholesterol: 150 mg/dL (ref 0–200)
HDL: 31 mg/dL — AB (ref >39)
LDL Cholesterol: 86 mg/dL (ref 0–99)
TRIGLYCERIDES: 163 mg/dL — AB (ref <150)
VLDL: 33 mg/dL (ref 0–40)

## 2014-01-16 LAB — COMPREHENSIVE METABOLIC PANEL
ALK PHOS: 61 U/L (ref 39–117)
ALT: 20 U/L (ref 0–35)
AST: 17 U/L (ref 0–37)
Albumin: 4.1 g/dL (ref 3.5–5.2)
BUN: 7 mg/dL (ref 6–23)
CALCIUM: 8.8 mg/dL (ref 8.4–10.5)
CHLORIDE: 102 meq/L (ref 96–112)
CO2: 25 mEq/L (ref 19–32)
CREATININE: 0.65 mg/dL (ref 0.50–1.10)
Glucose, Bld: 95 mg/dL (ref 70–99)
Potassium: 3.8 mEq/L (ref 3.5–5.3)
Sodium: 133 mEq/L — ABNORMAL LOW (ref 135–145)
Total Bilirubin: 0.3 mg/dL (ref 0.2–1.2)
Total Protein: 7.3 g/dL (ref 6.0–8.3)

## 2014-01-16 LAB — CBC WITH DIFFERENTIAL/PLATELET
BASOS PCT: 1 % (ref 0–1)
Basophils Absolute: 0.1 10*3/uL (ref 0.0–0.1)
Eosinophils Absolute: 0.1 10*3/uL (ref 0.0–0.7)
Eosinophils Relative: 1 % (ref 0–5)
HCT: 37 % (ref 36.0–46.0)
Hemoglobin: 12.9 g/dL (ref 12.0–15.0)
LYMPHS PCT: 37 % (ref 12–46)
Lymphs Abs: 2.1 10*3/uL (ref 0.7–4.0)
MCH: 29.6 pg (ref 26.0–34.0)
MCHC: 34.9 g/dL (ref 30.0–36.0)
MCV: 84.9 fL (ref 78.0–100.0)
Monocytes Absolute: 0.4 10*3/uL (ref 0.1–1.0)
Monocytes Relative: 7 % (ref 3–12)
NEUTROS ABS: 3.1 10*3/uL (ref 1.7–7.7)
Neutrophils Relative %: 54 % (ref 43–77)
Platelets: 187 10*3/uL (ref 150–400)
RBC: 4.36 MIL/uL (ref 3.87–5.11)
RDW: 14 % (ref 11.5–15.5)
WBC: 5.8 10*3/uL (ref 4.0–10.5)

## 2014-01-17 LAB — T-HELPER CELL (CD4) - (RCID CLINIC ONLY)
CD4 T CELL HELPER: 8 % — AB (ref 33–55)
CD4 T Cell Abs: 190 /uL — ABNORMAL LOW (ref 400–2700)

## 2014-01-17 LAB — RPR

## 2014-01-18 LAB — HIV-1 RNA QUANT-NO REFLEX-BLD
HIV 1 RNA Quant: 60825 copies/mL — ABNORMAL HIGH (ref <20)
HIV-1 RNA Quant, Log: 4.78 {Log} — ABNORMAL HIGH (ref <1.30)

## 2014-02-20 ENCOUNTER — Telehealth: Payer: Self-pay | Admitting: *Deleted

## 2014-02-20 NOTE — Telephone Encounter (Signed)
Patient called to change her appointment with Dr. Johnnye Sima because she is starting a new job on Monday 02/24/14 and needed a Wednesday appt. She has been off the Atripla because she states it makes her feel too "out of it". She is now concerned because she saw her lab results on mychart and is starting to not feel well. She did not want to wait for Dr. Algis Downs next available 04/09/14, because she would like a change in medication. Appointment scheduled with Dr. Linus Salmons for 02/26/14. Left in the follow up with Dr. Johnnye Sima for 04/09/14. Laura Rogers

## 2014-02-26 ENCOUNTER — Ambulatory Visit: Payer: Medicare PPO | Admitting: Internal Medicine

## 2014-03-03 ENCOUNTER — Ambulatory Visit: Payer: Medicare PPO | Admitting: Infectious Diseases

## 2014-04-09 ENCOUNTER — Ambulatory Visit (INDEPENDENT_AMBULATORY_CARE_PROVIDER_SITE_OTHER): Payer: Medicare PPO | Admitting: Infectious Diseases

## 2014-04-09 ENCOUNTER — Encounter: Payer: Self-pay | Admitting: Infectious Diseases

## 2014-04-09 VITALS — BP 125/85 | HR 103 | Temp 97.6°F | Wt 131.2 lb

## 2014-04-09 DIAGNOSIS — B20 Human immunodeficiency virus [HIV] disease: Secondary | ICD-10-CM

## 2014-04-09 DIAGNOSIS — F419 Anxiety disorder, unspecified: Secondary | ICD-10-CM

## 2014-04-09 DIAGNOSIS — A6000 Herpesviral infection of urogenital system, unspecified: Secondary | ICD-10-CM

## 2014-04-09 DIAGNOSIS — Z23 Encounter for immunization: Secondary | ICD-10-CM

## 2014-04-09 DIAGNOSIS — F411 Generalized anxiety disorder: Secondary | ICD-10-CM

## 2014-04-09 MED ORDER — ALPRAZOLAM 0.25 MG PO TABS: 0.5000 mg | ORAL_TABLET | Freq: Two times a day (BID) | ORAL | Status: DC

## 2014-04-09 MED ORDER — SERTRALINE HCL 25 MG PO TABS: 25.0000 mg | ORAL_TABLET | Freq: Every day | ORAL | Status: DC

## 2014-04-09 MED ORDER — ACYCLOVIR 5 % EX OINT: 1.0000 "application " | TOPICAL_OINTMENT | CUTANEOUS | Status: DC

## 2014-04-09 MED ORDER — ELVITEG-COBIC-EMTRICIT-TENOFDF 150-150-200-300 MG PO TABS: 1.0000 | ORAL_TABLET | Freq: Every day | ORAL | Status: DC

## 2014-04-09 NOTE — Assessment & Plan Note (Signed)
Will give her topical ACV. States she is allergic to Valtrex.

## 2014-04-09 NOTE — Progress Notes (Signed)
   Subjective:    Patient ID: Laura Rogers, female    DOB: [birth year], 38 y.o.   MRN: [id number]  HPI 38 yo F with hx of AIDS, prev on atripla. Also prev abn PAP (has had repeat nl Oct 2013). Has been off meds for 6 months as the atripla was giving her sweats, confused.   HIV 1 RNA Quant (copies/mL)  Date Value  01/16/2014 60825*  06/12/2013 <20   11/05/2012 <20      CD4 T Cell Abs (/uL)  Date Value  01/16/2014 190*  06/12/2013 370*  11/05/2012 430    Anxiety has been worse, would like xanax refill. Agrees to start zoloft.    Review of Systems  Constitutional: Negative for appetite change and unexpected weight change.  Gastrointestinal: Negative for diarrhea and constipation.  Genitourinary: Negative for difficulty urinating.  Musculoskeletal: Positive for arthralgias.  Psychiatric/Behavioral: The patient is nervous/anxious.        Objective:   Physical Exam  Constitutional: She appears well-developed and well-nourished.  HENT:  Mouth/Throat: No oropharyngeal exudate.  Eyes: EOM are normal. Pupils are equal, round, and reactive to light.  Neck: Neck supple.  Cardiovascular: Normal rate, regular rhythm and normal heart sounds.   Pulmonary/Chest: Effort normal and breath sounds normal.  Abdominal: Soft. Bowel sounds are normal. She exhibits no distension. There is no tenderness.  Lymphadenopathy:    She has no cervical adenopathy.          Assessment & Plan:

## 2014-04-09 NOTE — Assessment & Plan Note (Signed)
Will change her to stribilid. Given condoms. Will have her back in 2 months, 6 weeks for labs. Possible sfx explained.

## 2014-04-09 NOTE — Assessment & Plan Note (Signed)
Will refill her xanax. Start her on zoloft low dose for now.

## 2014-04-09 NOTE — Assessment & Plan Note (Signed)
Will schedule her for repeat. Had normal October 2013

## 2014-04-10 ENCOUNTER — Telehealth: Payer: Self-pay | Admitting: *Deleted

## 2014-04-10 NOTE — Telephone Encounter (Signed)
Prior Authorization for Zovirax Ointment from [employer].  Dr. Johnnye Sima must sign form.  Placed in MD's box in his work area for [employer].

## 2014-04-11 ENCOUNTER — Other Ambulatory Visit: Payer: Self-pay | Admitting: Licensed Clinical Social Worker

## 2014-04-11 DIAGNOSIS — A6000 Herpesviral infection of urogenital system, unspecified: Secondary | ICD-10-CM

## 2014-04-11 MED ORDER — ACYCLOVIR 5 % EX OINT: 1.0000 "application " | TOPICAL_OINTMENT | CUTANEOUS | Status: DC

## 2014-04-11 NOTE — Telephone Encounter (Signed)
Prior Authorization form signed by Dr. Johnnye Sima and faxed to [facility] at [phone]. Placed in scan box. Myrtis Hopping

## 2014-04-14 ENCOUNTER — Telehealth: Payer: Self-pay | Admitting: *Deleted

## 2014-04-14 NOTE — Telephone Encounter (Signed)
[facility] [address] and [address] notified.  Pt notified.

## 2014-04-23 ENCOUNTER — Ambulatory Visit: Payer: Medicare PPO

## 2014-04-29 ENCOUNTER — Other Ambulatory Visit: Payer: Medicare PPO

## 2014-05-14 ENCOUNTER — Ambulatory Visit: Payer: Medicare PPO | Admitting: Infectious Diseases

## 2014-05-15 ENCOUNTER — Other Ambulatory Visit (INDEPENDENT_AMBULATORY_CARE_PROVIDER_SITE_OTHER): Payer: Medicare PPO

## 2014-05-15 DIAGNOSIS — B20 Human immunodeficiency virus [HIV] disease: Secondary | ICD-10-CM

## 2014-05-16 LAB — HIV-1 RNA QUANT-NO REFLEX-BLD: HIV 1 RNA Quant: <20 copies/mL (ref <20)

## 2014-05-16 LAB — T-HELPER CELL (CD4) - (RCID CLINIC ONLY)
CD4 % Helper T Cell: 13 % — ABNORMAL LOW (ref 33–55)
CD4 T Cell Abs: 260 /uL — ABNORMAL LOW (ref 400–2700)

## 2014-06-16 ENCOUNTER — Telehealth: Payer: Self-pay | Admitting: *Deleted

## 2014-06-16 NOTE — Telephone Encounter (Signed)
Needing to make an appt for a PAP smear

## 2014-06-18 ENCOUNTER — Ambulatory Visit: Payer: Medicare PPO | Admitting: Infectious Diseases

## 2014-06-26 ENCOUNTER — Ambulatory Visit: Payer: Medicare PPO | Admitting: Infectious Diseases

## 2014-06-27 ENCOUNTER — Other Ambulatory Visit: Payer: Self-pay

## 2014-07-14 ENCOUNTER — Encounter: Payer: Self-pay | Admitting: Infectious Diseases

## 2014-10-30 ENCOUNTER — Other Ambulatory Visit (HOSPITAL_COMMUNITY)
Admission: RE | Admit: 2014-10-30 | Discharge: 2014-10-30 | Disposition: A | Discharge status: Home / Self Care | Payer: BLUE CROSS/BLUE SHIELD | Source: Ambulatory Visit | Attending: Infectious Diseases | Admitting: Infectious Diseases

## 2014-10-30 ENCOUNTER — Ambulatory Visit (INDEPENDENT_AMBULATORY_CARE_PROVIDER_SITE_OTHER): Payer: Medicare PPO | Admitting: *Deleted

## 2014-10-30 DIAGNOSIS — R87619 Unspecified abnormal cytological findings in specimens from cervix uteri: Secondary | ICD-10-CM | POA: Insufficient documentation

## 2014-10-30 DIAGNOSIS — Z113 Encounter for screening for infections with a predominantly sexual mode of transmission: Secondary | ICD-10-CM

## 2014-10-30 DIAGNOSIS — Z124 Encounter for screening for malignant neoplasm of cervix: Secondary | ICD-10-CM | POA: Diagnosis not present

## 2014-10-30 DIAGNOSIS — N760 Acute vaginitis: Secondary | ICD-10-CM | POA: Insufficient documentation

## 2014-10-30 DIAGNOSIS — R87612 Low grade squamous intraepithelial lesion on cytologic smear of cervix (LGSIL): Secondary | ICD-10-CM

## 2014-10-30 NOTE — Patient Instructions (Addendum)
Thank you for coming to the Center for your care.  Your results will be ready in about a week.  I will send you a message in [place].  Langley Gauss, RN

## 2014-10-30 NOTE — Progress Notes (Addendum)
  Subjective:     Laura Rogers is a 39 y.o. woman who comes in today for a  pap smear only. Previous abnormal Pap smears: yes. Contraception: condoms.  Pt c/o clear drainage with an ordor monthly prior to menses.  Objective:  LMP 09/27/14.  Pelvic Exam: Pap smear obtained.   Assessment:    Screening pap smear.   Plan:    Follow up in one year, or as indicated by Pap results.   Pt given educational materials re: HIV and women, self-esteem, BSE, nutrition and diet management, PAP smears and partner safety. Pt given condoms.  Pt with LSIL pap smear results.  Per Dr. Johnnye Sima, referral to [facility] for follow-up.  Left message for the pt about the referral to [facility].

## 2014-10-31 LAB — CYTOLOGY - PAP

## 2014-11-01 LAB — CERVICOVAGINAL ANCILLARY ONLY: Candida vaginitis: NEGATIVE

## 2014-11-04 ENCOUNTER — Other Ambulatory Visit: Payer: Medicare PPO

## 2014-11-05 NOTE — Addendum Note (Signed)
Addended by: Lorne Skeens D on: 11/05/2014 10:10 AM   Modules accepted: Orders

## 2014-11-06 ENCOUNTER — Telehealth: Payer: Self-pay | Admitting: *Deleted

## 2014-11-06 ENCOUNTER — Encounter: Payer: Self-pay | Admitting: Family Medicine

## 2014-11-06 NOTE — Telephone Encounter (Signed)
GYN referral appt @ [facility], 12/11/14 @ 1:15 pm.  Left message.  If patient has any questions please call [facility].

## 2014-11-12 ENCOUNTER — Ambulatory Visit: Payer: Medicare PPO | Admitting: Infectious Diseases

## 2014-11-19 ENCOUNTER — Ambulatory Visit: Payer: Medicare PPO | Admitting: Infectious Diseases

## 2014-12-04 ENCOUNTER — Ambulatory Visit (INDEPENDENT_AMBULATORY_CARE_PROVIDER_SITE_OTHER): Payer: Medicare PPO | Admitting: Internal Medicine

## 2014-12-04 ENCOUNTER — Encounter: Payer: Self-pay | Admitting: Internal Medicine

## 2014-12-04 VITALS — BP 159/111 | HR 88 | Temp 98.3°F | Wt 133.8 lb

## 2014-12-04 DIAGNOSIS — B20 Human immunodeficiency virus [HIV] disease: Secondary | ICD-10-CM

## 2014-12-04 DIAGNOSIS — B349 Viral infection, unspecified: Secondary | ICD-10-CM | POA: Diagnosis not present

## 2014-12-04 LAB — COMPLETE METABOLIC PANEL WITH GFR
ALBUMIN: 4.2 g/dL (ref 3.5–5.2)
ALT: 13 U/L (ref 0–35)
AST: 13 U/L (ref 0–37)
Alkaline Phosphatase: 83 U/L (ref 39–117)
BUN: 6 mg/dL (ref 6–23)
CALCIUM: 9.1 mg/dL (ref 8.4–10.5)
CHLORIDE: 103 meq/L (ref 96–112)
CO2: 26 meq/L (ref 19–32)
Creat: 0.68 mg/dL (ref 0.50–1.10)
GFR, Est African American: >89 mL/min
GFR, Est Non African American: >89 mL/min
GLUCOSE: 87 mg/dL (ref 70–99)
POTASSIUM: 4.4 meq/L (ref 3.5–5.3)
SODIUM: 136 meq/L (ref 135–145)
TOTAL PROTEIN: 7.3 g/dL (ref 6.0–8.3)
Total Bilirubin: 0.3 mg/dL (ref 0.2–1.2)

## 2014-12-04 LAB — CBC WITH DIFFERENTIAL/PLATELET
Basophils Absolute: 0.1 10*3/uL (ref 0.0–0.1)
Basophils Relative: 1 % (ref 0–1)
EOS PCT: 2 % (ref 0–5)
Eosinophils Absolute: 0.1 10*3/uL (ref 0.0–0.7)
HCT: 39.4 % (ref 36.0–46.0)
Hemoglobin: 13.4 g/dL (ref 12.0–15.0)
LYMPHS ABS: 2 10*3/uL (ref 0.7–4.0)
Lymphocytes Relative: 32 % (ref 12–46)
MCH: 30.2 pg (ref 26.0–34.0)
MCHC: 34 g/dL (ref 30.0–36.0)
MCV: 88.9 fL (ref 78.0–100.0)
MONO ABS: 0.4 10*3/uL (ref 0.1–1.0)
MONOS PCT: 7 % (ref 3–12)
MPV: 10.9 fL (ref 8.6–12.4)
Neutro Abs: 3.7 10*3/uL (ref 1.7–7.7)
Neutrophils Relative %: 58 % (ref 43–77)
Platelets: 294 10*3/uL (ref 150–400)
RBC: 4.43 MIL/uL (ref 3.87–5.11)
RDW: 13.8 % (ref 11.5–15.5)
WBC: 6.3 10*3/uL (ref 4.0–10.5)

## 2014-12-04 NOTE — Assessment & Plan Note (Signed)
Unfortunately she endorses poor compliance. I discussed with her resistance and the need to take the medication daily and dedicated to keep in her viral suppressed. She voiced her understanding. I'm going to check her labs today with a concern for resistance and she will come back to see her primary provider.

## 2014-12-04 NOTE — Assessment & Plan Note (Signed)
Symptoms consistent with viral syndrome. I discussed over-the-counter medication she can use. Work note was provided.

## 2014-12-04 NOTE — Progress Notes (Signed)
   Subjective:    Patient ID: Laura Rogers, female    DOB: 1976/03/28, 39 y.o.   MRN: [id number]  HPI She comes in here for a work in visit. She has a history of HIV and was started on Stribild last year. This was after having trouble with a Atripla and her viral load was up to 60,000 though after starting Stribild was back to undetectable in September 2015. Her last CD4 count was 260. Fortunately, she tells me she has not been taking her medication daily. She says she forgets often.  She is here today for sinus pressure, congestion, or throat. She endorses sick contacts from work. Left work early yesterday.  Wondering what medications she can take.   Review of Systems  Constitutional: Negative for fever, chills and fatigue.  HENT: Positive for congestion, postnasal drip, rhinorrhea, sinus pressure and sore throat.   Gastrointestinal: Negative for nausea and diarrhea.  Skin: Negative for rash.  Neurological: Negative for dizziness, light-headedness and headaches.       Objective:   Physical Exam  Constitutional: She appears well-developed and well-nourished. No distress.  HENT:  Mouth/Throat: No oropharyngeal exudate.  Eyes: No scleral icterus.  Cardiovascular: Normal rate, regular rhythm and normal heart sounds.   No murmur heard. Pulmonary/Chest: Effort normal and breath sounds normal. No respiratory distress.  Lymphadenopathy:    She has no cervical adenopathy.  Skin: No rash noted.          Assessment & Plan:

## 2014-12-05 LAB — T-HELPER CELL (CD4) - (RCID CLINIC ONLY)
CD4 % Helper T Cell: 17 % — ABNORMAL LOW (ref 33–55)
CD4 T Cell Abs: 370 /uL — ABNORMAL LOW (ref 400–2700)

## 2014-12-06 LAB — HIV-1 RNA QUANT-NO REFLEX-BLD
HIV 1 RNA Quant: <20 copies/mL (ref <20)
HIV-1 RNA Quant, Log: <1.3 {Log} (ref <1.30)

## 2014-12-11 ENCOUNTER — Encounter: Payer: Medicare PPO | Admitting: Family Medicine

## 2014-12-16 ENCOUNTER — Other Ambulatory Visit: Payer: Medicare PPO

## 2014-12-31 ENCOUNTER — Ambulatory Visit (INDEPENDENT_AMBULATORY_CARE_PROVIDER_SITE_OTHER): Payer: Medicare PPO | Admitting: Infectious Diseases

## 2014-12-31 ENCOUNTER — Encounter: Payer: Self-pay | Admitting: Infectious Diseases

## 2014-12-31 VITALS — BP 121/85 | HR 88 | Temp 98.1°F | Ht 60.0 in | Wt 131.0 lb

## 2014-12-31 DIAGNOSIS — R87610 Atypical squamous cells of undetermined significance on cytologic smear of cervix (ASC-US): Secondary | ICD-10-CM

## 2014-12-31 DIAGNOSIS — F419 Anxiety disorder, unspecified: Secondary | ICD-10-CM

## 2014-12-31 DIAGNOSIS — Z79899 Other long term (current) drug therapy: Secondary | ICD-10-CM

## 2014-12-31 DIAGNOSIS — Z113 Encounter for screening for infections with a predominantly sexual mode of transmission: Secondary | ICD-10-CM | POA: Diagnosis not present

## 2014-12-31 DIAGNOSIS — B20 Human immunodeficiency virus [HIV] disease: Secondary | ICD-10-CM | POA: Diagnosis not present

## 2014-12-31 MED ORDER — ELVITEG-COBIC-EMTRICIT-TENOFAF 150-150-200-10 MG PO TABS: 1.0000 | ORAL_TABLET | Freq: Every day | ORAL | Status: DC

## 2014-12-31 MED ORDER — ONDANSETRON HCL 4 MG PO TABS: 4.0000 mg | ORAL_TABLET | Freq: Three times a day (TID) | ORAL | Status: DC | PRN

## 2014-12-31 NOTE — Assessment & Plan Note (Signed)
Seems to be better.  Will have her seen by Grayland Ormond.  Continue zoloft.

## 2014-12-31 NOTE — Progress Notes (Signed)
   Subjective:    Patient ID: Laura Rogers, female    DOB: 1976-03-01, 39 y.o.   MRN: [id number]  HPI 39 yo F with hx of HIV+, was previously on atripla then intolerant, non-adherent. Was changed to stribild. Again had adherence difficulties.   Was out due to illness last week, got no rx. She needs doctors note for this to keep her job.   HIV 1 RNA QUANT (copies/mL)  Date Value  12/04/2014 <20  05/15/2014 <20  01/16/2014 00174*   CD4 T CELL ABS (/uL)  Date Value  12/04/2014 370*  05/15/2014 260*  01/16/2014 190*   Has been having nausea in AM. Takes her ART in the evening. She occasionally forgets to take her ART. She occasionally coughs up a lot of white phlegm.  Having bilateral knee pain. She sits at desk for work. Will see new PCP tomorrow. Has been taking advil daily.  Takes zoloft now. Has not noted a change in her depression.    Review of Systems  Constitutional: Negative for appetite change.  Gastrointestinal: Positive for nausea.  Musculoskeletal: Positive for arthralgias.  Psychiatric/Behavioral: Positive for dysphoric mood.       Objective:   Physical Exam  Constitutional: She appears well-developed and well-nourished.  HENT:  Mouth/Throat: No oropharyngeal exudate.  Eyes: EOM are normal. Pupils are equal, round, and reactive to light.  Neck: Neck supple.  Cardiovascular: Normal rate, regular rhythm and normal heart sounds.   Pulmonary/Chest: Effort normal and breath sounds normal.  Abdominal: Soft. Bowel sounds are normal. She exhibits no distension. There is no tenderness.  Musculoskeletal: She exhibits no edema or tenderness.  Lymphadenopathy:    She has no cervical adenopathy.       Assessment & Plan:

## 2014-12-31 NOTE — Assessment & Plan Note (Signed)
Will change her to genvoya.  She is given condoms Will give her zofran.  Will see her back in 4 months.

## 2014-12-31 NOTE — Assessment & Plan Note (Signed)
Will have her seen by GYN.

## 2015-01-05 ENCOUNTER — Telehealth: Payer: Self-pay | Admitting: *Deleted

## 2015-01-05 ENCOUNTER — Other Ambulatory Visit: Payer: Self-pay | Admitting: *Deleted

## 2015-01-05 MED ORDER — ELVITEG-COBIC-EMTRICIT-TENOFDF 150-150-200-300 MG PO TABS: 1.0000 | ORAL_TABLET | Freq: Every day | ORAL | Status: DC

## 2015-01-05 NOTE — Telephone Encounter (Signed)
Called patient and left a voice mail to call the clinic. Her insurance is not covering the new Rx for Genvoya at this time and Dr. Johnnye Sima has changed her back to Stribild. This has been sent to [facility]. Myrtis Hopping

## 2015-01-07 NOTE — Telephone Encounter (Signed)
Completed a PA for Genvoya through [place].  Waiting on response.

## 2015-01-09 NOTE — Telephone Encounter (Signed)
PA approved through 04/09/2015

## 2015-01-29 ENCOUNTER — Other Ambulatory Visit: Payer: Self-pay | Admitting: *Deleted

## 2015-01-29 DIAGNOSIS — B20 Human immunodeficiency virus [HIV] disease: Secondary | ICD-10-CM

## 2015-01-29 MED ORDER — ELVITEG-COBIC-EMTRICIT-TENOFAF 150-150-200-10 MG PO TABS: 1.0000 | ORAL_TABLET | Freq: Every day | ORAL | Status: DC

## 2015-02-04 ENCOUNTER — Telehealth: Payer: Self-pay | Admitting: *Deleted

## 2015-02-04 ENCOUNTER — Ambulatory Visit: Payer: Medicare PPO | Admitting: *Deleted

## 2015-02-04 ENCOUNTER — Encounter: Payer: Self-pay | Admitting: *Deleted

## 2015-02-04 DIAGNOSIS — F329 Major depressive disorder, single episode, unspecified: Secondary | ICD-10-CM

## 2015-02-04 DIAGNOSIS — F32A Depression, unspecified: Secondary | ICD-10-CM

## 2015-02-04 NOTE — Telephone Encounter (Signed)
Will sign when i am over for lunch Thanks for your outstanding care and diligence

## 2015-02-04 NOTE — Telephone Encounter (Signed)
Patient is seeing Leveda Anna for counseling and said she is recommending that she be out of work. She has already been out since 02/01/15. I believe Leveda Anna is doing a letter but it may need to be cosigned by Dr. Johnnye Sima.  Myrtis Hopping CMA

## 2015-02-04 NOTE — Telephone Encounter (Signed)
You are very welcome! I do not think Jodi's letter will be complete as she is leaving at 85 Noon today and is seeing a patient right now.

## 2015-02-05 NOTE — BH Specialist Note (Signed)
Sindee was present for her scheduled appointment today with counselor.  Client was oriented times four and appropriately dressed.  Client was talkative and willing to share freely but was very emotional about her life, her diagnosis, and her future.  Client indicated that she does not use substances but knew she was depressed.  Client stated that she had not been taking her anti depressant medication consistently.  Counselor inquired as to why and she stated that it does not work any and she is not even sure she cares abut her life enough to take it.  Counselor explained that it takes time to get into the system in order for it start working and encouraged her to talk to her doctor about its ineffectiveness.  Counselor provided time for client to discuss what is making her sad and frustrated.  Client begin to share that she felt like she was not worth of anything, that she had wasted such precious time and her body on "dumb shit".  Client communicated that she had prostituted herself out and felt like she was dirty and strongly questioned her value.  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  Counselor further shared with client that she like many learned how to utilize her body and physical appearance for her own survival. This allowed easy access to some money and afforded her a sense of importance and desirability.  Unfortunately, it was ok then but now she is suffering the repercussions of her decisions.  Counselor communicated that it is a time for healing and would encourage her to meet regularly in order to begin the healing process from her experiences and diagnosis.  Client agreed and was very receptive.  Counselor explained that she has wreaked havoc on her self worth and self esteem and that this emotional damage should be addressed so she can come to terms with what has taken place and move on with her future  and take care of her health. Client made another appointment to see counselor next week.   [name], LPCA, MA [facility]

## 2015-02-10 ENCOUNTER — Ambulatory Visit: Payer: Medicare PPO | Admitting: *Deleted

## 2015-02-17 ENCOUNTER — Ambulatory Visit: Payer: Medicare PPO | Admitting: *Deleted

## 2015-02-17 DIAGNOSIS — F32A Depression, unspecified: Secondary | ICD-10-CM

## 2015-02-17 DIAGNOSIS — F329 Major depressive disorder, single episode, unspecified: Secondary | ICD-10-CM

## 2015-02-17 NOTE — BH Specialist Note (Signed)
Yakira was present for her scheduled appointment today.  Client was oriented times four with good affect and dress.  Client was alert and talkative.  Client was in good spirits and upbeat. Client shared that she had returned to work from being out a week with severe depression.  Client stated that she had been feeling better and concentrating on the blessings in her life instead of just her diagnosis.  Counselor educated client about by getting linked to HIV medical care early, starting antiretroviral therapy (ART), adhering to her medication regimen, that you can keep the virus under control, and live a healthy life.  Client agreed and stated that she has tended to just sweep it under the carpet and not address the issue.  Counselor explained that getting diagnosed with HIV may take some time getting use to a new lifestyle and may mean patience is required to make the necessary adjustments.  Counselor did communicate that though a change is be implemented being in denial is not healthy.  Counselor talked with client about the practice of mindfulness and its benefits.  Counselor educated client about what mindfulness is...a state of active, open attention on the present. When you're mindful, you observe your thoughts and feelings from a distance but without judging.  Client agreed that she has been thinking very negatively lately.  Counselor explained that by using mindfulness, one can reduce the stress and enjoy a lot more.  Client indicated that this is her goal.  Client did admit to smoking cannabis a couple of times a day to help cope.  Counselor discussed with client that remaining drug free was imperative right now in lieu of the other medications she is prescribed.  Client stated that she will try and reduce the amount she is smoking weed.  Counselor further educated client on substance use as a coping mechanism is maladaptive and often substance used as a coping mechanism become an active addition and can  lead to other subtance abuse.  Client communicated that she understood and was going to cut back and or stop the cannabis use.  [name], LPCA, MA [facility]

## 2015-02-24 ENCOUNTER — Ambulatory Visit: Payer: Medicare PPO | Admitting: *Deleted

## 2015-02-24 DIAGNOSIS — F32A Depression, unspecified: Secondary | ICD-10-CM

## 2015-02-24 DIAGNOSIS — F329 Major depressive disorder, single episode, unspecified: Secondary | ICD-10-CM

## 2015-02-24 DIAGNOSIS — F1210 Cannabis abuse, uncomplicated: Secondary | ICD-10-CM

## 2015-02-24 NOTE — BH Specialist Note (Signed)
Chetara was present for her scheduled appointment today with counselor. Client was oriented times four with good affect and dress.  Client was alert and very talkative.  Client communicated that she feels pretty good today but decided that her job is just not right for her.  Client stated that she is presently looking for another job, one that does not have people arguying with her over the phone all the time.  Client reported that she is smoking cannabis every day and that it helps her to eat.  Counselor educated client about marijuana use and how it can negatively affect her.  Counselor discussed with client the stages of grief since she feels she has been so depressed about finding out about her diagnosis and feeling like it was a death sentence.  Client communicated that she feels like she went through the denial and shock stage, the anger stage, and the depression stage.  Client stated that she feels like she still has some days of feeling depressed but feels she is more in the "bargaining stage" now and able to talk to someone that she can trust and know that she will not be judged by her "secret". Client did state that the [place] is causing some adverse side affects such as nausea and diarrhea.  Counselor encouraged client to check with her doctor and make sure to communicate any negative side affects.  Client mentioned that her mother whom she has not had a close relationship until the diagnosis is burdening her with requests to get in touch with her biological father before something happens and she dies.  Client stated that she wishes her mother would stop telling everyone that she is dying. Client stated that she didn't want her mother to know but was visiting her at the time she was placed in the hospital and was diagnosed.  Client stated that she is trying to being understanding where her mother is concerned because she knows she is just scared for her.  Counselor talked with client about  boundaries and setting those with her mother to free herself from unnecessary anxiety. Another appointment was made by client to meet with counselor next week.  [name], LPCA, MA [facility]

## 2015-03-05 ENCOUNTER — Ambulatory Visit: Payer: Medicare PPO | Admitting: *Deleted

## 2015-03-05 DIAGNOSIS — F329 Major depressive disorder, single episode, unspecified: Secondary | ICD-10-CM

## 2015-03-05 DIAGNOSIS — F32A Depression, unspecified: Secondary | ICD-10-CM

## 2015-03-05 DIAGNOSIS — F1210 Cannabis abuse, uncomplicated: Secondary | ICD-10-CM

## 2015-03-05 NOTE — BH Specialist Note (Signed)
Laura Rogers was present for her scheduled appointment today.  Client was oriented times four with good mood and affect.  Client rerported no suicidal ideation.  Client was in good spirits but indicated that she is not enjoying her work because of the nature of the job duties.  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  Client shared that she is currently looking and applying for other work.  Client admitted that she is still smoking marijuana daily and that it helps with her eating as she communicates she would not eat unless she smoked weed daily. Counselor educated client on coming up with different ways to cope and develop an appetite other than having to use an illegal substance.  Client reported that she has been smoking cannabis her whole life and does not feel it has any true negative affect but only the opposite. Client stated that she has been trying to take some time for herself lately to help with her depression.  Client shared that she went to the beach with some girlfriends and had a really good time.  Counselor discussed with client about her committment to change her lifestyle so that she can fight the depression and hopelessness she has experienced lately.  Client was receptive and paid close attention to the recommendations provided for by counselor.  Counselor encouraged client to: eat healthy, exercise, set goals, get in a routine, get plenty of rest, and challenge negative thoughts.  Client shared that she felt like she was in a different place mentally and felt that she has overcome that initial fear about being diagnosed with HIV/AIDS.  Counselor provided support and encouragement for client accordingly.   [name], LPCA, MA [facility]

## 2015-03-06 ENCOUNTER — Other Ambulatory Visit: Payer: Self-pay | Admitting: Infectious Diseases

## 2015-03-12 ENCOUNTER — Ambulatory Visit: Payer: Medicare PPO | Admitting: *Deleted

## 2015-03-12 DIAGNOSIS — F32A Depression, unspecified: Secondary | ICD-10-CM

## 2015-03-12 DIAGNOSIS — F329 Major depressive disorder, single episode, unspecified: Secondary | ICD-10-CM

## 2015-03-12 NOTE — BH Specialist Note (Signed)
Nareh was present today for her scheduled appointment with counselor.  Client was oriented times four with good affect and dress. Client was alert and talkative.  Client reported continued daily cannabis use.  Client said that she will never stop smoking weed because it is not habit forming nor addictive.  Client shared that she needed some additional paperwork to excuse her for the appointments she has had and had to miss work to attend.  Counselor assisted client with that necessary paperwork.   Client stated that she was feeling pretty good but had had a terrible argument with her mother over 2 bills.  There was an altercation over the phone and client stated that she hung up on her mother and then blocked her so she could not get through.  Client stated that her mother had really been pushing her buttons lately and she needed to cut her off at least momentarily. Client complained a lot today about people not providing support to her like she felt she deserved.  Counselor helped client identify that her ruminating can lead to depression if she is not careful.  Client says that she has always been there for others but is not receiving the same treatment and or support.  Client said that this makes her feel taken advantage of and not special.  Counselor talked with client about rumination and learning the skills necessary to let things go that are not so serious.  Counselor encouraged client to use her thought stopping tecniques to avoid getting in a rut and mulling over the same.  Counselor worked with client on replaceing negative thoughts for happy ones.  Client said that she is tired of being disappointed in the actions or lack of actions in others.  Counselor educated client that her perspective is not always what another person's perspective is.  Counselor worked with client on internal triggers and how they affect recovery and our mental state.  Client was cooperative and listened attentively.  Counselor  provided support and encouragement accordingly.   [name], LPCA, MA [facility]

## 2015-03-19 ENCOUNTER — Telehealth: Payer: Self-pay | Admitting: *Deleted

## 2015-03-19 ENCOUNTER — Ambulatory Visit: Payer: Medicare PPO | Admitting: *Deleted

## 2015-03-19 DIAGNOSIS — F329 Major depressive disorder, single episode, unspecified: Secondary | ICD-10-CM

## 2015-03-19 DIAGNOSIS — F1210 Cannabis abuse, uncomplicated: Secondary | ICD-10-CM

## 2015-03-19 DIAGNOSIS — F32A Depression, unspecified: Secondary | ICD-10-CM

## 2015-03-19 NOTE — Telephone Encounter (Signed)
Pt went to Urgent Care on [address] for severe headache, and blurred vision.  Pt was given a prescription for Tramadol.  This medication is working on the headache.  Pt also reported that the nausea and vomiting have stopped after starting the new prescription for Zofram.  Pt mentioned that she is having the "nasal drip" symptom associated with Genvoya too.  Patient believes that these symptoms began after starting new medication, Genvoya.  Rescheduled pt's August 2016 f/u office visit to 04/10/15 due to pt's concerns about these symptoms.  Lab visit also rescheduled.

## 2015-03-19 NOTE — BH Specialist Note (Signed)
Laura Rogers was present for her scheduled appointment today with counselor.  Client was oriented times four with good affect and dress.  Client was alert and very talkative.  Client had a lot say and did not stop talking the entire session.  Client shared information about her life freely but most was insignifiant to her depression and cannabis use.  Client was assertive and confident while sharing.  Client indicated that she had made things better with her mother by buying her some flowers for her birthday and sending it to her.  Client said that this made her feel better and be a peace.  Client shared that she still has her mother blocked on her phone because she feels she has to protect herself of any unnecessary drama.  Counselor complimented client for taking the imitative to minimize the drama in her life and stay in a place mentally of peace and contentment.  Client did communicate that she started having serious headaches and wen tot he urgent care clinic.  Client stated that she was told she was having affects from her HIV medication and was given and additional medication to combat the adverse side affects.  Client said that started feeling better very quickly after taking the additional medication.  Client says that she is feeling pretty good physically now.  Client stated that her job is not being understanding because of all the time she has taken off lately.  Client communicated that she does not feel that her medical problems warrant her being out so much lately.  Counselor inquired with client how she was sleeping and she stated pretty good.  Client appears happier and more confident and is exhibiting less signs of depression than previous sessions together.  Counselor provided support and encouragement accordingly. Client made another appointment with counselor for two weeks out.  [name], LPCA, MA [facility] [place]

## 2015-03-26 ENCOUNTER — Other Ambulatory Visit: Payer: Medicare PPO

## 2015-04-02 ENCOUNTER — Ambulatory Visit: Payer: Medicare PPO | Admitting: *Deleted

## 2015-04-02 NOTE — BH Specialist Note (Signed)
Laura Rogers was present for her scheduled appointment today.  Client was oriented times four with good affect and dress.  Client was alert and talkative.  Client denied any suicidal ideation at this time. Client shared that things were going ok but was having a hard time living with her boyfriend because he is sloppy and does not provide the attention that she requires in a relationship. Client indicated that she is considering moving to the beach and is going on a week long trip there in another month to sort out her thoughts about possibly moving there.  Client stated that she is not liking her job that it has become such a thing that she dreads everyday she has to go there.  Client communicated that she is presently seeking work else where but has yet to be called in for an interview.   Client stated that she stopped taking her HIV medication last week due to migrane headaches that left her incapacitated and nauseated to the point of throwing up violently.  Counselor sought out client's doctor (Dr. Johnnye Sima) to which an appointment was scheduled for client to see him Monday morning.  Counselor brought to client's attention that she is negative about a lot of things in a short amount of time.  Counselor encouraged client to incorporate into her life things she can do and control to make her life more positive such as: deep breathing exercises, visualizing desired situations and outcomes, meditation, say affirmations, practice mindfulness, and hang out with positive people.  Counselor assigned homework for client in that she was asked to use at least three of these tips during the next week to improve the positiveness with the outcome of her life.     Another appointment was made for client in two weeks.  [name], LPCA, MA [facility]

## 2015-04-06 ENCOUNTER — Encounter: Payer: Self-pay | Admitting: Infectious Diseases

## 2015-04-06 ENCOUNTER — Ambulatory Visit (INDEPENDENT_AMBULATORY_CARE_PROVIDER_SITE_OTHER): Payer: BLUE CROSS/BLUE SHIELD | Admitting: Infectious Diseases

## 2015-04-06 ENCOUNTER — Telehealth: Payer: Self-pay | Admitting: *Deleted

## 2015-04-06 VITALS — BP 135/85 | HR 74 | Temp 98.0°F | Ht 61.0 in | Wt 133.0 lb

## 2015-04-06 DIAGNOSIS — G43001 Migraine without aura, not intractable, with status migrainosus: Secondary | ICD-10-CM

## 2015-04-06 DIAGNOSIS — K5909 Other constipation: Secondary | ICD-10-CM | POA: Diagnosis not present

## 2015-04-06 DIAGNOSIS — R87619 Unspecified abnormal cytological findings in specimens from cervix uteri: Secondary | ICD-10-CM | POA: Diagnosis not present

## 2015-04-06 DIAGNOSIS — G43909 Migraine, unspecified, not intractable, without status migrainosus: Secondary | ICD-10-CM | POA: Insufficient documentation

## 2015-04-06 DIAGNOSIS — B20 Human immunodeficiency virus [HIV] disease: Secondary | ICD-10-CM

## 2015-04-06 MED ORDER — EMTRICITAB-RILPIVIR-TENOFOV DF 200-25-300 MG PO TABS: 1.0000 | ORAL_TABLET | Freq: Every day | ORAL | Status: DC

## 2015-04-06 NOTE — Assessment & Plan Note (Signed)
Will change her art to see if this helps.  Continue tramadol.  Have her seen by neuro.

## 2015-04-06 NOTE — Assessment & Plan Note (Signed)
Will change her ART to see if this helps her headaches.  Will see her back in 6 weeks with labs prior. Offered/refused condoms.  Re-inforced need to take with food.

## 2015-04-06 NOTE — Progress Notes (Signed)
   Subjective:    Patient ID: Laura Rogers, female    DOB: August 21, 1976, 39 y.o.   MRN: [id number]  HPI 39 yo F with HIV+, depression.  Has been off genvoya for 2 weeks. Has been having migraines, vomiting. Blurry vision with looking at screen at work. Has had some relief with tramadol that she got at Urgent Care.   HIV 1 RNA QUANT (copies/mL)  Date Value  12/04/2014 <20  05/15/2014 <20  01/16/2014 25003*   CD4 T CELL ABS (/uL)  Date Value  12/04/2014 370*  05/15/2014 260*  01/16/2014 190*   Was seen by her PCP and told to come to see [place] for her headache, could genvoya could be causing headaches.   Review of Systems  Gastrointestinal: Positive for nausea and constipation.  Neurological: Positive for headaches.  Psychiatric/Behavioral: Positive for dysphoric mood.      Objective:   Physical Exam  Constitutional: She appears well-developed and well-nourished.  HENT:  Mouth/Throat: No oropharyngeal exudate.  Eyes: EOM are normal. Pupils are equal, round, and reactive to light.  Neck: Neck supple.  Cardiovascular: Normal rate, regular rhythm and normal heart sounds.   Pulmonary/Chest: Effort normal and breath sounds normal.  Abdominal: Soft. Bowel sounds are normal. There is no tenderness.  Lymphadenopathy:    She has no cervical adenopathy.      Assessment & Plan:

## 2015-04-06 NOTE — Addendum Note (Signed)
Addended by: Landis Gandy on: 04/06/2015 05:19 PM   Modules accepted: Orders, Medications

## 2015-04-06 NOTE — Assessment & Plan Note (Signed)
Encouraged her to drink more water.

## 2015-04-06 NOTE — Assessment & Plan Note (Signed)
Re-refer to [facility] for PAP

## 2015-04-06 NOTE — Telephone Encounter (Addendum)
Sent referral to [facility] via [facility].  Will follow up. Patient is to contact [facility] to see if she can reschedule.  She was referred for LSIL, no-showed in March 2016.  She will contact [facility] to let me know if she needs a new referral. Landis Gandy, RN

## 2015-04-10 ENCOUNTER — Ambulatory Visit: Payer: Medicare PPO | Admitting: Infectious Diseases

## 2015-04-15 ENCOUNTER — Other Ambulatory Visit: Payer: Medicare PPO

## 2015-04-16 ENCOUNTER — Ambulatory Visit: Payer: Medicare PPO | Admitting: *Deleted

## 2015-04-20 ENCOUNTER — Telehealth: Payer: Self-pay | Admitting: *Deleted

## 2015-04-20 NOTE — Telephone Encounter (Signed)
Left message asking if patient had contacted [facility] to reschedule her appointment, or if she needed a referral.  Left message asking [facility] if they needed a new referral. Their number is [phone]. Landis Gandy, RN

## 2015-04-22 ENCOUNTER — Ambulatory Visit: Payer: Medicare PPO | Admitting: *Deleted

## 2015-04-23 ENCOUNTER — Other Ambulatory Visit: Payer: Self-pay | Admitting: *Deleted

## 2015-04-23 ENCOUNTER — Ambulatory Visit: Payer: Medicare PPO | Admitting: *Deleted

## 2015-04-23 ENCOUNTER — Other Ambulatory Visit: Payer: Self-pay | Admitting: Infectious Diseases

## 2015-04-23 DIAGNOSIS — F329 Major depressive disorder, single episode, unspecified: Secondary | ICD-10-CM

## 2015-04-23 DIAGNOSIS — F32A Depression, unspecified: Secondary | ICD-10-CM

## 2015-04-23 DIAGNOSIS — F1210 Cannabis abuse, uncomplicated: Secondary | ICD-10-CM

## 2015-04-23 DIAGNOSIS — R87612 Low grade squamous intraepithelial lesion on cytologic smear of cervix (LGSIL): Secondary | ICD-10-CM

## 2015-04-23 NOTE — BH Specialist Note (Signed)
Laura Rogers was present for her scheduled appointment.  Client was oriented times four with good affect and dress.  Client was alert and very talkative.  Client shared that she is doing ok but that her relationship with her boyfriend whom she lives with it is not going well.  Client stated that she is feeling good about herself and becoming empowered and independent and she feels her boyfriend wants her to be completely reliant on him.  Client says she considering moving out and getting away from the drama that is constantly going on there.  Client states that she is still smoking cannabis and occassionally drinks  but not excessively.  Overall, client is feeling good about her diagnosis and is more accepting of it.  Client indicated that she is beginning to tell people she has HIV and is not ashamed of doing so.  Counselor shared that she does not require ongoing services now so close together that meeting again in a month is recommended at this time.  Client agreed and said she appreciates all the support she has received at [facility] during this difficult time.  Client made another appointment to check in with counselor in a few weeks  Laura Rogers, LPCA, MA [facility]

## 2015-04-23 NOTE — Telephone Encounter (Signed)
Patient's referral from January is still valid, she can call to schedule at any time.  RN reminded patient of the phone number while she was in the clinic today with counseling.  She verbalized understanding.

## 2015-04-27 ENCOUNTER — Other Ambulatory Visit: Payer: Medicare PPO

## 2015-04-29 ENCOUNTER — Ambulatory Visit: Payer: Medicare PPO | Admitting: Infectious Diseases

## 2015-04-30 ENCOUNTER — Ambulatory Visit: Payer: Medicare PPO | Admitting: *Deleted

## 2015-04-30 ENCOUNTER — Other Ambulatory Visit: Payer: Medicare PPO

## 2015-04-30 ENCOUNTER — Other Ambulatory Visit: Payer: Self-pay

## 2015-04-30 MED ORDER — TRAMADOL HCL 50 MG PO TABS: 50.0000 mg | ORAL_TABLET | Freq: Three times a day (TID) | ORAL | Status: DC | PRN

## 2015-04-30 NOTE — Telephone Encounter (Signed)
Patient calling for Tramadol refill.   Laverle Patter, RN

## 2015-05-01 ENCOUNTER — Telehealth: Payer: Self-pay | Admitting: *Deleted

## 2015-05-01 NOTE — Telephone Encounter (Signed)
"  cluster headache" causing problelms at work.  Leaving work today due to "blurred vision" and headache.  Wanting to discuss tramadol prescription.  Requesting appt.

## 2015-05-04 ENCOUNTER — Other Ambulatory Visit (HOSPITAL_COMMUNITY)
Admission: RE | Admit: 2015-05-04 | Discharge: 2015-05-04 | Disposition: A | Discharge status: Home / Self Care | Payer: BLUE CROSS/BLUE SHIELD | Source: Ambulatory Visit | Attending: Infectious Diseases | Admitting: Infectious Diseases

## 2015-05-04 ENCOUNTER — Ambulatory Visit: Payer: Medicare PPO | Admitting: Infectious Diseases

## 2015-05-04 ENCOUNTER — Encounter: Payer: Self-pay | Admitting: Infectious Diseases

## 2015-05-04 ENCOUNTER — Ambulatory Visit (INDEPENDENT_AMBULATORY_CARE_PROVIDER_SITE_OTHER): Payer: Medicare PPO | Admitting: Infectious Diseases

## 2015-05-04 VITALS — BP 120/81 | HR 108 | Temp 98.3°F | Ht 61.0 in | Wt 134.0 lb

## 2015-05-04 DIAGNOSIS — B20 Human immunodeficiency virus [HIV] disease: Secondary | ICD-10-CM | POA: Diagnosis not present

## 2015-05-04 DIAGNOSIS — G43009 Migraine without aura, not intractable, without status migrainosus: Secondary | ICD-10-CM | POA: Diagnosis not present

## 2015-05-04 DIAGNOSIS — K5900 Constipation, unspecified: Secondary | ICD-10-CM | POA: Diagnosis not present

## 2015-05-04 DIAGNOSIS — Z113 Encounter for screening for infections with a predominantly sexual mode of transmission: Secondary | ICD-10-CM

## 2015-05-04 DIAGNOSIS — H539 Unspecified visual disturbance: Secondary | ICD-10-CM

## 2015-05-04 DIAGNOSIS — N870 Mild cervical dysplasia: Secondary | ICD-10-CM

## 2015-05-04 DIAGNOSIS — Z79899 Other long term (current) drug therapy: Secondary | ICD-10-CM

## 2015-05-04 LAB — LIPID PANEL
CHOL/HDL RATIO: 5.6 ratio — AB (ref <=5.0)
CHOLESTEROL: 211 mg/dL — AB (ref 125–200)
HDL: 38 mg/dL — AB (ref >=46)
LDL Cholesterol: 145 mg/dL — ABNORMAL HIGH (ref <130)
TRIGLYCERIDES: 139 mg/dL (ref <150)
VLDL: 28 mg/dL (ref <30)

## 2015-05-04 LAB — COMPREHENSIVE METABOLIC PANEL
ALBUMIN: 3.9 g/dL (ref 3.6–5.1)
ALK PHOS: 64 U/L (ref 33–115)
ALT: 11 U/L (ref 6–29)
AST: 13 U/L (ref 10–30)
BILIRUBIN TOTAL: 0.3 mg/dL (ref 0.2–1.2)
BUN: 11 mg/dL (ref 7–25)
CALCIUM: 8.7 mg/dL (ref 8.6–10.2)
CO2: 26 mmol/L (ref 20–31)
Chloride: 105 mmol/L (ref 98–110)
Creat: 0.81 mg/dL (ref 0.50–1.10)
GLUCOSE: 87 mg/dL (ref 65–99)
POTASSIUM: 4.6 mmol/L (ref 3.5–5.3)
Sodium: 137 mmol/L (ref 135–146)
TOTAL PROTEIN: 7 g/dL (ref 6.1–8.1)

## 2015-05-04 LAB — CBC
HEMATOCRIT: 38.3 % (ref 36.0–46.0)
HEMOGLOBIN: 13.5 g/dL (ref 12.0–15.0)
MCH: 31 pg (ref 26.0–34.0)
MCHC: 35.2 g/dL (ref 30.0–36.0)
MCV: 87.8 fL (ref 78.0–100.0)
MPV: 11.6 fL (ref 8.6–12.4)
Platelets: 229 10*3/uL (ref 150–400)
RBC: 4.36 MIL/uL (ref 3.87–5.11)
RDW: 13.5 % (ref 11.5–15.5)
WBC: 5.7 10*3/uL (ref 4.0–10.5)

## 2015-05-04 MED ORDER — SUMATRIPTAN SUCCINATE 25 MG PO TABS: 25.0000 mg | ORAL_TABLET | ORAL | Status: DC | PRN

## 2015-05-04 NOTE — Addendum Note (Signed)
Addended by: Romana Juniper D on: 05/04/2015 12:43 PM   Modules accepted: Orders

## 2015-05-04 NOTE — Assessment & Plan Note (Signed)
Will give her trial of imitrex.

## 2015-05-04 NOTE — Assessment & Plan Note (Signed)
Will check her labs today.  She is doing well.  Will get condoms from [facility].  rtc 6 months.

## 2015-05-04 NOTE — Progress Notes (Signed)
   Subjective:    Patient ID: Laura Rogers, female    DOB: [birth year], 39 y.o.   MRN: [id number]  HPI  39 yo F with HIV+, depression. Also with hx of cluster headaches.  At her previous visit her genvoya was changed to odefsy to see if this would help her headaches.  Had to leave work on Friday due to headaches. She took tramadol (which she got for urgent care). She denies having taken imitrex prior.  She has GYN f/u next week. Has neuro f/u next month.  No problems with new ART, takes at night. Less headaches.  Has been having bluriness with her vision with looking at screen for too long.   HIV 1 RNA QUANT (copies/mL)  Date Value  12/04/2014 <20  05/15/2014 <20  01/16/2014 10315*   CD4 T CELL ABS (/uL)  Date Value  12/04/2014 370*  05/15/2014 260*  01/16/2014 190*     Review of Systems  Constitutional: Negative for appetite change and unexpected weight change.  Eyes: Positive for visual disturbance.  Gastrointestinal: Negative for nausea, constipation and blood in stool.  Genitourinary: Negative for difficulty urinating and menstrual problem.  Neurological: Positive for headaches.  12 point ROS o/w (-)     Objective:   Physical Exam  Constitutional: She appears well-developed and well-nourished.  HENT:  Mouth/Throat: No oropharyngeal exudate.  Eyes: EOM are normal. Pupils are equal, round, and reactive to light.  Neck: Neck supple.  Cardiovascular: Normal rate, regular rhythm and normal heart sounds.   Pulmonary/Chest: Effort normal and breath sounds normal.  Abdominal: Soft. Bowel sounds are normal. There is no tenderness. There is no rebound.  Musculoskeletal: She exhibits no edema.  Lymphadenopathy:    She has no cervical adenopathy.       Assessment & Plan:

## 2015-05-04 NOTE — Assessment & Plan Note (Signed)
Has GUN f/u this month.

## 2015-05-04 NOTE — Addendum Note (Signed)
Addended by: Landis Gandy on: 05/04/2015 12:35 PM   Modules accepted: Orders

## 2015-05-04 NOTE — Assessment & Plan Note (Signed)
Will send her to ophtho.

## 2015-05-04 NOTE — Assessment & Plan Note (Signed)
Encouraged her to drink more H2O

## 2015-05-05 LAB — T-HELPER CELL (CD4) - (RCID CLINIC ONLY)
CD4 T CELL HELPER: 19 % — AB (ref 33–55)
CD4 T Cell Abs: 390 /uL — ABNORMAL LOW (ref 400–2700)

## 2015-05-05 LAB — URINE CYTOLOGY ANCILLARY ONLY
Chlamydia: NEGATIVE
Neisseria Gonorrhea: NEGATIVE

## 2015-05-05 LAB — RPR

## 2015-05-05 LAB — HIV-1 RNA QUANT-NO REFLEX-BLD
HIV 1 RNA Quant: <20 copies/mL (ref <20)
HIV-1 RNA Quant, Log: <1.3 {Log} (ref <1.30)

## 2015-05-06 ENCOUNTER — Telehealth: Payer: Self-pay | Admitting: *Deleted

## 2015-05-06 ENCOUNTER — Other Ambulatory Visit: Payer: Medicare PPO

## 2015-05-06 ENCOUNTER — Ambulatory Visit: Payer: Medicare PPO | Admitting: *Deleted

## 2015-05-06 NOTE — Telephone Encounter (Signed)
PA recevied for Sumatriptan rx.  Completed PA online 05/06/15.  24-72 hours for response.  [employer] pt.  [employer] pt id = [id number].

## 2015-05-07 ENCOUNTER — Encounter: Payer: Self-pay | Admitting: Obstetrics & Gynecology

## 2015-05-07 ENCOUNTER — Other Ambulatory Visit (HOSPITAL_COMMUNITY)
Admission: RE | Admit: 2015-05-07 | Discharge: 2015-05-07 | Disposition: A | Discharge status: Home / Self Care | Payer: BLUE CROSS/BLUE SHIELD | Source: Ambulatory Visit | Attending: Obstetrics & Gynecology | Admitting: Obstetrics & Gynecology

## 2015-05-07 ENCOUNTER — Ambulatory Visit (INDEPENDENT_AMBULATORY_CARE_PROVIDER_SITE_OTHER): Payer: Medicare PPO | Admitting: Obstetrics & Gynecology

## 2015-05-07 VITALS — BP 121/73 | HR 89 | Temp 99.1°F | Ht 62.0 in | Wt 133.2 lb

## 2015-05-07 DIAGNOSIS — R87619 Unspecified abnormal cytological findings in specimens from cervix uteri: Secondary | ICD-10-CM | POA: Diagnosis not present

## 2015-05-07 DIAGNOSIS — N870 Mild cervical dysplasia: Secondary | ICD-10-CM

## 2015-05-07 LAB — POCT PREGNANCY, URINE: Preg Test, Ur: NEGATIVE

## 2015-05-07 NOTE — Progress Notes (Signed)
   Subjective:    Patient ID: Laura Rogers, female    DOB: 1976/04/19, 39 y.o.   MRN: [id number]  HPI  39 yo AA P0 with HIV here for a colpo due to a pap 2/16 that showed LGSIL.  Review of Systems     Objective:   Physical Exam UPT negative, consent signed, time out done Cervix prepped with acetic acid. Transformation zone seen in its entirety. Colpo adequate. Entirely normal ectocervix ECC obtained. She tolerated the procedure well.        Assessment & Plan:  LGSIL on pap, normal colpo- await ECC

## 2015-05-08 NOTE — Telephone Encounter (Signed)
Approved. Patient has already picked up the prescription from the pharmacy.

## 2015-05-11 ENCOUNTER — Ambulatory Visit: Payer: Medicare PPO | Admitting: Infectious Diseases

## 2015-05-20 ENCOUNTER — Ambulatory Visit: Payer: Medicare PPO | Admitting: Infectious Diseases

## 2015-06-03 ENCOUNTER — Ambulatory Visit (INDEPENDENT_AMBULATORY_CARE_PROVIDER_SITE_OTHER): Payer: Medicare PPO | Admitting: Neurology

## 2015-06-03 ENCOUNTER — Encounter: Payer: Self-pay | Admitting: Neurology

## 2015-06-03 VITALS — BP 116/72 | HR 80 | Temp 98.3°F | Resp 16 | Ht 62.0 in | Wt 135.3 lb

## 2015-06-03 DIAGNOSIS — B20 Human immunodeficiency virus [HIV] disease: Secondary | ICD-10-CM

## 2015-06-03 DIAGNOSIS — G43009 Migraine without aura, not intractable, without status migrainosus: Secondary | ICD-10-CM

## 2015-06-03 MED ORDER — SUMATRIPTAN SUCCINATE 50 MG PO TABS: ORAL_TABLET | ORAL | Status: DC

## 2015-06-03 NOTE — Progress Notes (Signed)
NEUROLOGY CONSULTATION NOTE  NAVPREET SZCZYGIEL MRN: [id number] DOB: 11-12-1975  Referring provider: Dr. Johnnye Sima Primary care provider: Dr. Orland Mustard  Reason for consult:  headache  HISTORY OF PRESENT ILLNESS: Laura Rogers is a 39 year old right-handed female with depression and AIDS who presents for migraines.  History obtained by patient and ID note.  Labs reviewed.  Onset:  2012.  5 months later, she was diagnosed with AIDS.  She has had them on and off, but they became worse after starting Genvoya. Location:  Starts in left occipital region and radiates up midline to top of head and forehead, as well as in the neck. Quality:  Pounding in the back of head, squeezing on top and front of head Intensity:  10/10 Aura:  no Prodrome:  no Associated symptoms:  Nausea, vomiting, photophobia, phonophobia Duration:  Initially 3-4 days.  Since discontinuing Genvoya, headaches have improved and she only had one headache roughly per month. Frequency:  Almost daily Triggers/exacerbating factors:  stress Relieving factors:  sleep Activity:  Cannot function  Past abortive medication:  BCs, ibuprofen Past preventative medication:  none Other past therapy:  none  Current abortive medication:  sumatriptan 36m (helps but takes 2 hours to work), tramadol (effective in 30-45 minutes but causes fatigue), zofran (for nausea) Antihypertensive medications:  none Antidepressant medications:  sertraline Anticonvulsant medications:  none Other medication:  Odefsy (changed from GSubiacoto see if it would help headaches)  Caffeine:  coffee Alcohol:  occasionally Smoker:  marijuana Diet:  Trying to become vegetarian.  Needs to drink more water. Exercise:  Trying to increase Depression/stress:  varies Sleep hygiene:  varies Family history of headache:  no  PAST MEDICAL HISTORY: Past Medical History  Diagnosis Date  . HIV infection     PAST SURGICAL HISTORY: Past Surgical History  Procedure  Laterality Date  . No past surgeries      MEDICATIONS: Current Outpatient Prescriptions on File Prior to Visit  Medication Sig Dispense Refill  . acyclovir ointment (ZOVIRAX) 5 % Apply 1 application topically every 3 (three) hours. 15 g 3  . Emtricitab-Rilpivir-Tenofov DF 200-25-300 MG TABS Take 1 tablet by mouth daily. 30 tablet 5  . ondansetron (ZOFRAN) 4 MG tablet TAKE 1 TABLET(4 MG) BY MOUTH EVERY 8 HOURS AS NEEDED FOR NAUSEA OR VOMITING 20 tablet 0  . sertraline (ZOLOFT) 25 MG tablet TAKE 1 TABLET BY MOUTH DAILY 90 tablet 1  . traMADol (ULTRAM) 50 MG tablet Take 1 tablet (50 mg total) by mouth every 8 (eight) hours as needed. 30 tablet 0   No current facility-administered medications on file prior to visit.    ALLERGIES: Allergies  Allergen Reactions  . Levofloxacin Shortness Of Breath and Other (See Comments)    Pt states she also has severe sweating, dehydration and bumbs on her tongue.  . Valacyclovir Hcl Anaphylaxis, Hives and Swelling    Also c/o purple legs when she stands. Sent to ed  . Levofloxacin   . Penicillins Other (See Comments)    Childhood- unsure of reaction  . Bactrim Rash    FAMILY HISTORY: Family History  Problem Relation Age of Onset  . Asthma Brother   . Cancer Maternal Grandmother   . Diabetes Father   . Hyperlipidemia Mother     SOCIAL HISTORY: Social History   Social History  . Marital Status: Married    Spouse Name: N/A  . Number of Children: 0  . Years of Education: 12   Occupational History  .  Not on file.   Social History Main Topics  . Smoking status: Former Smoker -- 0.10 packs/day    Types: Cigarettes    Start date: 09/12/2012  . Smokeless tobacco: Never Used  . Alcohol Use: 0.0 oz/week    0 Standard drinks or equivalent per week     Comment: occasional  . Drug Use: 7.00 per week    Special: Marijuana  . Sexual Activity:    Partners: Male     Comment: accepted condoms   Other Topics Concern  . Not on file   Social  History Narrative   Patient lives with Fiance lives in apartment.   Patient has highschool degree, works at [employer]   Patient is right handed.     REVIEW OF SYSTEMS: Constitutional: No fevers, chills, or sweats, no generalized fatigue, change in appetite Eyes: No visual changes, double vision, eye pain Ear, nose and throat: No hearing loss, ear pain, nasal congestion, sore throat Cardiovascular: No chest pain, palpitations Respiratory:  No shortness of breath at rest or with exertion, wheezes GastrointestinaI: No nausea, vomiting, diarrhea, abdominal pain, fecal incontinence Genitourinary:  No dysuria, urinary retention or frequency Musculoskeletal:  No neck pain, back pain Integumentary: No rash, pruritus, skin lesions Neurological: as above Psychiatric: variable depression and anxiety Endocrine: No palpitations, fatigue, diaphoresis, mood swings, change in appetite, change in weight, increased thirst Hematologic/Lymphatic:  No anemia, purpura, petechiae. Allergic/Immunologic: no itchy/runny eyes, nasal congestion, recent allergic reactions, rashes  PHYSICAL EXAM: Filed Vitals:   06/03/15 1014  BP: 116/72  Pulse: 80  Temp: 98.3 F (36.8 C)  Resp: 16   General: No acute distress.  Patient appears well-groomed.  Head:  Normocephalic/atraumatic Eyes:  fundi unremarkable, without vessel changes, exudates, hemorrhages or papilledema. Neck: supple, no paraspinal tenderness, full range of motion Back: No paraspinal tenderness Heart: regular rate and rhythm Lungs: Clear to auscultation bilaterally. Vascular: No carotid bruits. Neurological Exam: Mental status: alert and oriented to person, place, and time, recent and remote memory intact, fund of knowledge intact, attention and concentration intact, speech fluent and not dysarthric, language intact. Cranial nerves: CN I: not tested CN II: pupils equal, round and reactive to light, visual fields intact, fundi unremarkable, without  vessel changes, exudates, hemorrhages or papilledema. CN III, IV, VI:  full range of motion, no nystagmus, no ptosis CN V: facial sensation intact CN VII: upper and lower face symmetric CN VIII: hearing intact CN IX, X: gag intact, uvula midline CN XI: sternocleidomastoid and trapezius muscles intact CN XII: tongue midline Bulk & Tone: normal, no fasciculations. Motor:  5/5 throughout Sensation:  Pinprick and vibration sensation intact. Deep Tendon Reflexes:  2+ throughout, toes downgoing.  Finger to nose testing:  Without dysmetria.  Heel to shin:  Without dysmetria.  Gait:  Normal station and stride.  Able to turn and tandem walk. Romberg negative.  IMPRESSION: Migraine without aura, likely triggered by Genvoya.  Now much better controlled.   PLAN: Increase sumatriptan to 3m as directed Follow up in 3 months.  Thank you for allowing me to take part in the care of this patient.  AMetta Clines DO  CC:  ALondon Pepper MD  JBobby Rumpf MD

## 2015-06-03 NOTE — Patient Instructions (Signed)
Migraine Recommendations: 1.  I think the headaches are migraines 2.  Take sumatriptan 65m at earliest onset of headache.  May repeat dose once in 2 hours if needed.  Do not exceed two tablets in 24 hours. 3.  Limit use of pain relievers to no more than 2 days out of the week.  These medications include acetaminophen, ibuprofen, triptans and narcotics.  This will help reduce risk of rebound headaches. 4.  Be aware of common food triggers such as processed sweets, processed foods with nitrites (such as deli meat, hot dogs, sausages), foods with MSG, alcohol (such as wine), chocolate, certain cheeses, certain fruits (dried fruits, some citrus fruit), vinegar, diet soda. 4.  Avoid caffeine 5.  Routine exercise 6.  Proper sleep hygiene 7.  Stay adequately hydrated with water 8.  Keep a headache diary. 9.  Maintain proper stress management. 10.  Do not skip meals. 11.  Consider supplements:  Magnesium oxide 4039mto 60019maily, riboflavin 400m68moenzyme Q 10 100mg14mee times daily 12.  Follow up in 3 months.

## 2015-09-16 ENCOUNTER — Ambulatory Visit: Payer: Medicare PPO

## 2015-09-16 DIAGNOSIS — B20 Human immunodeficiency virus [HIV] disease: Secondary | ICD-10-CM

## 2015-09-16 DIAGNOSIS — Z79899 Other long term (current) drug therapy: Secondary | ICD-10-CM

## 2015-09-16 DIAGNOSIS — Z113 Encounter for screening for infections with a predominantly sexual mode of transmission: Secondary | ICD-10-CM

## 2015-09-16 LAB — LIPID PANEL
CHOLESTEROL: 187 mg/dL (ref 125–200)
HDL: 35 mg/dL — ABNORMAL LOW (ref >=46)
LDL Cholesterol: 116 mg/dL (ref <130)
Total CHOL/HDL Ratio: 5.3 Ratio — ABNORMAL HIGH (ref <=5.0)
Triglycerides: 180 mg/dL — ABNORMAL HIGH (ref <150)
VLDL: 36 mg/dL — AB (ref <30)

## 2015-09-17 LAB — RPR

## 2015-09-17 LAB — T-HELPER CELL (CD4) - (RCID CLINIC ONLY)
CD4 % Helper T Cell: 17 % — ABNORMAL LOW (ref 33–55)
CD4 T CELL ABS: 340 /uL — AB (ref 400–2700)

## 2015-09-18 ENCOUNTER — Ambulatory Visit: Payer: Medicare PPO | Admitting: Neurology

## 2015-09-18 LAB — HIV-1 RNA QUANT-NO REFLEX-BLD
HIV 1 RNA QUANT: 560 {copies}/mL — AB (ref <20)
HIV-1 RNA QUANT, LOG: 2.75 {Log_copies}/mL — AB (ref <1.30)

## 2015-09-30 ENCOUNTER — Ambulatory Visit: Payer: Medicare PPO | Admitting: Infectious Diseases

## 2015-10-02 ENCOUNTER — Ambulatory Visit: Payer: Medicare PPO | Admitting: Infectious Diseases

## 2015-10-12 ENCOUNTER — Other Ambulatory Visit: Payer: Self-pay | Admitting: Infectious Diseases

## 2015-10-14 ENCOUNTER — Telehealth: Payer: Self-pay | Admitting: *Deleted

## 2015-10-14 ENCOUNTER — Other Ambulatory Visit: Payer: Self-pay | Admitting: *Deleted

## 2015-10-14 MED ORDER — TRAMADOL HCL 50 MG PO TABS: 50.0000 mg | ORAL_TABLET | Freq: Three times a day (TID) | ORAL | Status: DC | PRN

## 2015-10-14 MED ORDER — ONDANSETRON HCL 4 MG PO TABS
ORAL_TABLET | ORAL | Status: DC
Start: 2015-10-14 — End: 2020-10-06

## 2015-10-14 NOTE — Telephone Encounter (Signed)
PA needed for [facility] rx - completed and sent to CoverMyMeds.

## 2015-10-15 ENCOUNTER — Encounter: Payer: Self-pay | Admitting: *Deleted

## 2015-10-15 ENCOUNTER — Other Ambulatory Visit: Payer: Self-pay | Admitting: *Deleted

## 2015-10-15 DIAGNOSIS — B20 Human immunodeficiency virus [HIV] disease: Secondary | ICD-10-CM

## 2015-10-15 MED ORDER — EMTRICITAB-RILPIVIR-TENOFOV DF 200-25-300 MG PO TABS: 1.0000 | ORAL_TABLET | Freq: Every day | ORAL | Status: DC

## 2015-10-19 NOTE — Telephone Encounter (Signed)
PA approved - will be filled by [facility].

## 2015-10-23 ENCOUNTER — Other Ambulatory Visit: Payer: Self-pay | Admitting: *Deleted

## 2015-10-23 DIAGNOSIS — B20 Human immunodeficiency virus [HIV] disease: Secondary | ICD-10-CM

## 2015-10-23 MED ORDER — EMTRICITAB-RILPIVIR-TENOFOV DF 200-25-300 MG PO TABS: 1.0000 | ORAL_TABLET | Freq: Every day | ORAL | Status: DC

## 2015-11-04 ENCOUNTER — Ambulatory Visit (INDEPENDENT_AMBULATORY_CARE_PROVIDER_SITE_OTHER): Payer: BLUE CROSS/BLUE SHIELD | Admitting: Infectious Diseases

## 2015-11-04 ENCOUNTER — Encounter: Payer: Self-pay | Admitting: Infectious Diseases

## 2015-11-04 VITALS — BP 150/93 | HR 120 | Temp 100.6°F | Wt 130.2 lb

## 2015-11-04 DIAGNOSIS — B20 Human immunodeficiency virus [HIV] disease: Secondary | ICD-10-CM

## 2015-11-04 DIAGNOSIS — R87612 Low grade squamous intraepithelial lesion on cytologic smear of cervix (LGSIL): Secondary | ICD-10-CM | POA: Diagnosis not present

## 2015-11-04 DIAGNOSIS — N870 Mild cervical dysplasia: Secondary | ICD-10-CM

## 2015-11-04 MED ORDER — TEMAZEPAM 15 MG PO CAPS: 15.0000 mg | ORAL_CAPSULE | Freq: Every evening | ORAL | Status: DC | PRN

## 2015-11-04 MED ORDER — EMTRICITABINE-TENOFOVIR AF 200-25 MG PO TABS: 1.0000 | ORAL_TABLET | Freq: Every day | ORAL | Status: DC

## 2015-11-04 MED ORDER — DOLUTEGRAVIR SODIUM 50 MG PO TABS: 50.0000 mg | ORAL_TABLET | Freq: Every day | ORAL | Status: DC

## 2015-11-04 NOTE — Progress Notes (Signed)
   Subjective:    Patient ID: Laura Rogers, female    DOB: 04-Jul-1976, 40 y.o.   MRN: [id number]  HPI 40 yo F with HIV+, depression. Also with hx of cluster headaches.  Previously genvoya was changed to odefsy which helped her headaches somewhat. But has mad eher nauseated.  She was seen last year for CIN1, had colpo 04-2015. Negative.  Since Thursday has been feeling poorly. More fatguied.  Broke her R foot Jan 2016. Has pain from that, mostly at night. Has been taking percocet, tramadol, tylenol. Can't get back to sleep if she wakes up.   Partner is HIV-, they use condoms.   HIV 1 RNA QUANT (copies/mL)  Date Value  09/16/2015 560*  05/04/2015 <20  12/04/2014 <20   CD4 T CELL ABS (/uL)  Date Value  09/16/2015 340*  05/04/2015 390*  12/04/2014 370*   Review of Systems  Constitutional: Positive for fever. Negative for chills, appetite change and unexpected weight change.  Gastrointestinal: Positive for diarrhea. Negative for constipation.  Genitourinary: Negative for difficulty urinating.  does not have GYN f/u.  Having thrush.  Taking meds irreg.      Objective:   Physical Exam  Constitutional: She appears well-developed and well-nourished.  HENT:  Mouth/Throat: No oropharyngeal exudate.  Eyes: EOM are normal. Pupils are equal, round, and reactive to light.  Neck: Neck supple.  Cardiovascular: Normal rate, regular rhythm and normal heart sounds.   Pulmonary/Chest: Effort normal and breath sounds normal.  Abdominal: Soft. Bowel sounds are normal. There is no tenderness. There is no rebound.  Musculoskeletal:  RLE in boot.   Lymphadenopathy:    She has no cervical adenopathy.       Assessment & Plan:

## 2015-11-04 NOTE — Assessment & Plan Note (Signed)
Will get onto pap list

## 2015-11-04 NOTE — Assessment & Plan Note (Signed)
Will have her get pap here, her colpo was (-)

## 2015-11-04 NOTE — Assessment & Plan Note (Addendum)
Will give her sleeping aide  Will change her ART (due to nausea) to DTGV/Desc Get her in with Junie Panning Will check HLA with next blood draw (she refuses today) Will write work note Will get her for PAP Will see her back in 3 months

## 2015-12-11 ENCOUNTER — Ambulatory Visit (INDEPENDENT_AMBULATORY_CARE_PROVIDER_SITE_OTHER): Payer: BLUE CROSS/BLUE SHIELD | Admitting: *Deleted

## 2015-12-11 DIAGNOSIS — Z124 Encounter for screening for malignant neoplasm of cervix: Secondary | ICD-10-CM | POA: Diagnosis not present

## 2015-12-11 DIAGNOSIS — Z113 Encounter for screening for infections with a predominantly sexual mode of transmission: Secondary | ICD-10-CM | POA: Diagnosis not present

## 2015-12-11 DIAGNOSIS — R87619 Unspecified abnormal cytological findings in specimens from cervix uteri: Secondary | ICD-10-CM | POA: Diagnosis not present

## 2015-12-11 NOTE — Progress Notes (Signed)
  Subjective:     Laura Rogers is a 40 y.o. woman who comes in today for a  pap smear only.  Previous abnormal Pap smears: yes. Contraception: condoms.   There were no vitals taken for this visit. Pelvic Exam:  Pap smear obtained.   Assessment:  LMP 11/11/15  Screening pap smear.   Plan:    Follow up in one year, or as indicated by Pap results.  Pt given educational materials re: HIV and women, self-esteem, BSE, nutrition and diet management, PAP smears and partner safety. Pt given condoms and lubricant.

## 2015-12-11 NOTE — Patient Instructions (Signed)
Your results will be ready in about a week.  I will mail them to you.  Thank you for coming to the Center for your care.  Langley Gauss,  RN

## 2015-12-14 LAB — CERVICOVAGINAL ANCILLARY ONLY
Chlamydia: NEGATIVE
Neisseria Gonorrhea: NEGATIVE

## 2015-12-14 LAB — CYTOLOGY - PAP

## 2015-12-18 ENCOUNTER — Encounter: Payer: Self-pay | Admitting: *Deleted

## 2016-04-07 ENCOUNTER — Encounter: Payer: Self-pay | Admitting: Infectious Diseases

## 2016-04-11 ENCOUNTER — Telehealth: Payer: Self-pay | Admitting: *Deleted

## 2016-04-11 NOTE — Telephone Encounter (Signed)
Please see patient's previous mychart message. She is requesting FMLA paperwork be filled out for any upcoming lab and MD visits. She is requesting that her employer fax over the paperwork. She does not have a PCP.

## 2016-04-13 ENCOUNTER — Other Ambulatory Visit: Payer: BLUE CROSS/BLUE SHIELD

## 2016-04-13 ENCOUNTER — Encounter: Payer: Self-pay | Admitting: *Deleted

## 2016-04-13 DIAGNOSIS — B20 Human immunodeficiency virus [HIV] disease: Secondary | ICD-10-CM

## 2016-04-14 NOTE — Addendum Note (Signed)
Addended by: Dolan Amen D on: 04/14/2016 03:32 PM   Modules accepted: Orders

## 2016-04-15 LAB — T-HELPER CELL (CD4) - (RCID CLINIC ONLY)
CD4 T CELL ABS: 410 /uL (ref 400–2700)
CD4 T CELL HELPER: 14 % — AB (ref 33–55)

## 2016-04-15 LAB — HIV-1 RNA QUANT-NO REFLEX-BLD: HIV-1 RNA Quant, Log: <1.3 Log copies/mL (ref <1.30)

## 2016-04-21 ENCOUNTER — Ambulatory Visit: Payer: BLUE CROSS/BLUE SHIELD | Admitting: *Deleted

## 2016-04-21 LAB — HLA B*5701: HLA-B*5701 w/rflx HLA-B High: NEGATIVE

## 2016-04-22 ENCOUNTER — Encounter: Payer: Self-pay | Admitting: Infectious Diseases

## 2016-04-26 ENCOUNTER — Ambulatory Visit: Payer: BLUE CROSS/BLUE SHIELD | Admitting: *Deleted

## 2016-04-26 DIAGNOSIS — F411 Generalized anxiety disorder: Secondary | ICD-10-CM

## 2016-04-27 NOTE — BH Specialist Note (Signed)
Laura Rogers showed for her scheduled appointment today but was 30 minutes late. Patient apologized and said that traffic slowed her up.  Patient was oriented times four but with an uptight affect.  Patient was anxious, excitable and keyed up upon arrival.  Patient immediately began to communicate this long story about an incident that happened at her work that is draining her emotionally.  Patient communicated that she fell and hurt her ankle and her employer was not treating her in a sensitive way. Counselor provided support and encouragement as patient shared.  Counselor used reflective listening skills as patient ranted and raved about the mistreatment that she has experienced.  Counselor spoke a quote to patient that said "don't let your struggle become your identity".  Counselor asked patient what she thought of that statement and asked her to elaborate.  Patient said that she knows that she is dwelling on all that has happened and becoming more and more disillusioned with her work environment.  Patient said she started crying a couple of times at her desk at work and people were wondering if she was ok to work. Counselor recommended that patient make an appointment soon to continue to address these issues and learn better coping skills. Patient agreed and made an additional appointment with counselor.   Laura Infante, MA, LPC [facility]

## 2016-04-29 ENCOUNTER — Ambulatory Visit: Payer: BLUE CROSS/BLUE SHIELD | Admitting: *Deleted

## 2016-05-02 ENCOUNTER — Encounter: Payer: Self-pay | Admitting: Infectious Diseases

## 2016-05-02 NOTE — BH Specialist Note (Signed)
Terin contacted counselor today via phone call in a frantic state insisting on seeing counselor.  Patient was tearful on the phone and shared that needed to talk with counselor as she was not in a good place.  Patient showed about an hour later at office.  Patient was oriented times four with sad affect but proper dress.  Patient stated that she and her roommate had a major argument and she felt overwhelmed by all that was happening in her life lately.

## 2016-05-09 ENCOUNTER — Encounter: Payer: Self-pay | Admitting: Infectious Diseases

## 2016-05-12 ENCOUNTER — Ambulatory Visit: Payer: BLUE CROSS/BLUE SHIELD | Admitting: *Deleted

## 2016-05-12 DIAGNOSIS — F411 Generalized anxiety disorder: Secondary | ICD-10-CM

## 2016-05-12 NOTE — BH Specialist Note (Signed)
Laura Rogers showed on time for her scheduled appointment today with counselor.  Patient was oriented times four with good affect and dress.  Patient was alert and talkative.  Patient was in pretty good spirits but still had a lot to complain about as far as her job situation goes.  Patient discussed part of the session about the difficulties in her personal relationship with her boyfriend as well.  Counselor used reflective listening skills as patient shared freely.  Counselor worked with patient on challenging negative thoughts.  For example, counselor encouraged patient to question each of her thoughts especially as it pertained to the disagreements with work and her boyfriend as to whether or not what she is thinking and getting ready to say moves her more towards her ultimate goal.  Patient didn't know what her ultimate goal was with regard to work and her personal relationship.  Counselor assisted patient in determining that keeping her job and hr boyfriend was her ultimate goal. Patient did mention that she has been considering putting in her two week notice for her job and also possibly leaving her boyfriend and moving away.  Counselor again encouraged patient to ask herself if her thoughts warranted action or to just let it go.  Counselor recommended that patient create a journal and record certain situations that cause drama at work and at home with her boyfriend and record how she responds and thinks.  Patient made another appointment for two weeks out.   [name], MA, LPC [facility]

## 2016-06-07 ENCOUNTER — Ambulatory Visit: Payer: BLUE CROSS/BLUE SHIELD | Admitting: *Deleted

## 2016-06-07 ENCOUNTER — Encounter: Payer: Self-pay | Admitting: Infectious Diseases

## 2016-06-07 DIAGNOSIS — F4323 Adjustment disorder with mixed anxiety and depressed mood: Secondary | ICD-10-CM

## 2016-06-07 NOTE — BH Specialist Note (Signed)
Laura Rogers called counselor this morning requesting to see counselor this afternoon.  Counselor made arrangements to get patient in for an appointment.  Patient showed on time and was in sad spirits.  Patient shared that she had found out yesterday for sure that her live in boyfriend of 6 years was cheating.  Patient was upset and stated that she has no where to go and no one to stay with until she can get on her feet, save the money necessary for a new apartment, and move out.  Patient stated that she is really hurt by what has happened and has not been able to go to work in two days. Counselor provided support and encouragement accordingly.  Counselor implemented a relaxation meditation with patient to calm her nerves, develop new positive energy, bring attention to her tool box for coping, and make rational wise decisions.  Patient responded well and participated accordingly.  Counselor recommended that patient continue meeting for counseling to process the breakup and grieving time. Patient made another appointment for two weeks out.   [name], MA, LPC [facility]

## 2016-06-15 ENCOUNTER — Ambulatory Visit (INDEPENDENT_AMBULATORY_CARE_PROVIDER_SITE_OTHER): Payer: BLUE CROSS/BLUE SHIELD | Admitting: Infectious Diseases

## 2016-06-15 ENCOUNTER — Encounter: Payer: Self-pay | Admitting: Infectious Diseases

## 2016-06-15 VITALS — BP 138/95 | HR 105 | Temp 98.4°F | Wt 133.0 lb

## 2016-06-15 DIAGNOSIS — N870 Mild cervical dysplasia: Secondary | ICD-10-CM

## 2016-06-15 DIAGNOSIS — Z23 Encounter for immunization: Secondary | ICD-10-CM

## 2016-06-15 DIAGNOSIS — B20 Human immunodeficiency virus [HIV] disease: Secondary | ICD-10-CM

## 2016-06-15 DIAGNOSIS — Z79899 Other long term (current) drug therapy: Secondary | ICD-10-CM

## 2016-06-15 DIAGNOSIS — A6000 Herpesviral infection of urogenital system, unspecified: Secondary | ICD-10-CM

## 2016-06-15 DIAGNOSIS — Z7969 Long term (current) use of other immunomodulators and immunosuppressants: Secondary | ICD-10-CM

## 2016-06-15 DIAGNOSIS — Z113 Encounter for screening for infections with a predominantly sexual mode of transmission: Secondary | ICD-10-CM

## 2016-06-15 MED ORDER — ACYCLOVIR 200 MG PO CAPS: 400.0000 mg | ORAL_CAPSULE | Freq: Three times a day (TID) | ORAL | Status: AC

## 2016-06-15 NOTE — Assessment & Plan Note (Signed)
Will get repeat PAP next year.

## 2016-06-15 NOTE — Progress Notes (Signed)
   Subjective:    Patient ID: Laura Rogers, female    DOB: [birth year], 40 y.o.   MRN: [id number]  HPI 40 yo F with HIV+, depression. Also with hx of cluster headaches.  Previously genvoya was changed to odefsy which helped her headaches somewhat. But has made her nauseated. She is currently on tivicay/descovy.  Also hx of CIN1, had colpo 04-2015. Negative.  Had PAP negative 11-2015.  Upset today, overloaded with stress. Has appt with Leveda Anna (not sure this is helping).   HIV 1 RNA Quant (copies/mL)  Date Value  04/13/2016 <20  09/16/2015 560 (H)  05/04/2015 <20   CD4 T Cell Abs (/uL)  Date Value  04/13/2016 410  09/16/2015 340 (L)  05/04/2015 390 (L)   Worried her HSV is acting up. Would like a refill of her prev rx (prev valtrex gave her facial swelling). She has been using acyclovir ointment.   Review of Systems  Constitutional: Positive for appetite change. Negative for unexpected weight change.  Gastrointestinal: Positive for nausea. Negative for constipation and diarrhea.  Genitourinary: Positive for genital sores. Negative for difficulty urinating.  Psychiatric/Behavioral: Positive for dysphoric mood.      Objective:   Physical Exam  Constitutional: She appears well-developed and well-nourished.  HENT:  Mouth/Throat: No oropharyngeal exudate.  Eyes: EOM are normal. Pupils are equal, round, and reactive to light.  Neck: Neck supple.  Cardiovascular: Normal rate, regular rhythm and normal heart sounds.   Pulmonary/Chest: Effort normal and breath sounds normal.  Abdominal: Soft. Bowel sounds are normal. There is no tenderness. There is no rebound.  Musculoskeletal: She exhibits no edema.  Lymphadenopathy:    She has no cervical adenopathy.      Assessment & Plan:

## 2016-06-15 NOTE — Assessment & Plan Note (Signed)
Will try acyclovir.  She asks for this and is aware of risk of allergic reaction.

## 2016-06-15 NOTE — Assessment & Plan Note (Signed)
She is doing well.  Will keep her on her current art as she is doing very well.  Will have her speak with Leveda Anna about support group.  rtc in 6 months.  Flu shot today Offered/refused condoms.

## 2016-06-16 ENCOUNTER — Telehealth: Payer: Self-pay | Admitting: *Deleted

## 2016-06-16 NOTE — Telephone Encounter (Signed)
Received signed record release for last office note and faxed to workmans comp case worker Zachery Dakins at [phone]. Confirmation received

## 2016-06-23 ENCOUNTER — Encounter: Payer: Self-pay | Admitting: Infectious Diseases

## 2016-06-27 ENCOUNTER — Encounter: Payer: Self-pay | Admitting: Infectious Diseases

## 2016-06-28 ENCOUNTER — Encounter: Payer: Self-pay | Admitting: Infectious Diseases

## 2016-06-28 ENCOUNTER — Ambulatory Visit: Payer: BLUE CROSS/BLUE SHIELD | Admitting: *Deleted

## 2016-06-30 ENCOUNTER — Encounter: Payer: Self-pay | Admitting: Infectious Diseases

## 2016-06-30 ENCOUNTER — Telehealth: Payer: Self-pay | Admitting: *Deleted

## 2016-06-30 NOTE — Telephone Encounter (Signed)
Patient called stating her employer told her that a note was faxed from this office and wanted to know what that was about. Advised the only time we release any info is if she personally signed for it. She said she did not. Asked her to tell the employer to call here and we could find out exactly what was faxed. She is having difficulty getting her back pay, when she had injured her foot. She is currently back at work.

## 2016-07-14 ENCOUNTER — Ambulatory Visit: Payer: BLUE CROSS/BLUE SHIELD | Admitting: *Deleted

## 2016-07-14 DIAGNOSIS — F411 Generalized anxiety disorder: Secondary | ICD-10-CM

## 2016-07-15 NOTE — BH Specialist Note (Signed)
Daveda showed for her scheduled appointment today with counselor.  Patient was oriented times four with good affect and dress.  Patient was alert and talkative.  Patient indicated that she had been implementing relaxation exercises that she previous participated in during counseling.  Patient says that she is feeling a little better and is remaining calmer than usual and practicing her meditation. Patient did communicate that she is feeling a little down because there was death in her family.  Her cousin died over the weekend unexpectedly.  Counselor provided support and encouragement accordingly. Counselor educated patient about the grieving process and the stages that are associated with it.  Patient responded well and was cooperative throughout session today. Counselor worked with patient on some DBT skills such as differentiating between high intensity situations and low intensity situations.  Patient listened attentively. Counselor recommended that patient meet again in two weeks.   [name], MA, LPC [facility]

## 2016-08-08 ENCOUNTER — Encounter: Payer: Self-pay | Admitting: Infectious Diseases

## 2016-08-08 ENCOUNTER — Ambulatory Visit: Payer: BLUE CROSS/BLUE SHIELD | Admitting: *Deleted

## 2016-08-08 ENCOUNTER — Telehealth: Payer: Self-pay | Admitting: *Deleted

## 2016-08-08 DIAGNOSIS — F603 Borderline personality disorder: Secondary | ICD-10-CM

## 2016-08-08 NOTE — Telephone Encounter (Signed)
Patient came into clinic today for an appt with Leveda Anna and requested to speak to a nurse. She is having symptoms of a yeast infection with itching and burning. She has been taking monistat over the counter without any relief. She stated that her partner had a yeast infection and was treated. She is requesting an Rx for diflucan and uses [facility] at [facility] in [place]. Please advise.

## 2016-08-09 ENCOUNTER — Encounter: Payer: Self-pay | Admitting: Infectious Diseases

## 2016-08-09 NOTE — Progress Notes (Signed)
Laura Rogers called in and requested an appointment with counselor on the spur of the moment.  Patient has a history of doing this.  An appointment was made for the afternoon to which patient showed up 31 minutes late for.  Patient also has a habit of doing this as well showing up for appointment considerably late staying for a brief amount of time and then asking for a doctors note for work. Patient is typically accommodated was forewarned at last appointment that she needs to make an appointment and keep the appointment and appointment time.  Counselor could not meet with patient and patient was asked to make another appointment and show on time for it. Patient emailed the triage office later requesting that counselor see her as soon as possible.   Laura Infante, MA, LPC [facility]

## 2016-08-10 ENCOUNTER — Other Ambulatory Visit: Payer: Self-pay | Admitting: *Deleted

## 2016-08-10 ENCOUNTER — Encounter: Payer: Self-pay | Admitting: *Deleted

## 2016-08-10 DIAGNOSIS — N898 Other specified noninflammatory disorders of vagina: Secondary | ICD-10-CM

## 2016-08-10 MED ORDER — FLUCONAZOLE 100 MG PO TABS: 100.0000 mg | ORAL_TABLET | Freq: Every day | ORAL | 0 refills | Status: DC

## 2016-08-10 NOTE — Telephone Encounter (Signed)
Rx sent to patient's pharmacy

## 2016-08-10 NOTE — Telephone Encounter (Signed)
Would give her fluconazole 133m, po qday for 7 days.

## 2016-08-12 ENCOUNTER — Telehealth: Payer: Self-pay | Admitting: *Deleted

## 2016-08-12 NOTE — Telephone Encounter (Signed)
Pharmacy calling to clarify patient HIV medication regimen.  Per [facility] the patient has not refilled her Descovy and Tivicay since July 2017.  RN told the pharmacy that she would need to check with Dr. Johnnye Sima about refilling these medication after a lapse in the patient taking them.  Dr. Johnnye Sima please advise.

## 2016-08-13 NOTE — Telephone Encounter (Signed)
Ok to refill, she does need to come in though thanks

## 2016-08-15 ENCOUNTER — Ambulatory Visit: Payer: BLUE CROSS/BLUE SHIELD | Admitting: *Deleted

## 2016-08-15 DIAGNOSIS — F603 Borderline personality disorder: Secondary | ICD-10-CM

## 2016-08-15 NOTE — Progress Notes (Signed)
Gredmarie showed for her scheduled appointment today with counselor.  Patient was oriented times four with good affect and dress.  Patient was solemn and not very talkative.  Patient indicated that she needed a doctors note for last week for Monday and Tuesday.  Counselor explained that she could not get a note when she did not have an appointment and did not meet face to face with counselor.  Patient said that she was probably going to lose her job.  Counselor felt patient trying to manipulate but counselor stuck to the decision.  Patient began to shut down the conversation and said that she needed to go.  Counselor communicated to patient that it appeared she was just coming to get a doctors note and not really wanting to work on her issues.  Patient said she was sorry but just needed to go because she was not feeling well. Counselor let patient know that she was working a notice and would be leaving in two weeks.  Counselor encouraged patient to seek counseling with the other therapist on staff.  Patient said she would think about it.  [name], MA, LPC [facility]

## 2016-08-15 NOTE — Telephone Encounter (Signed)
[facility] called back to confirm, ok to fill per Dr Johnnye Sima. RN authorized fill. [facility] pharmacist will call back if there are further questions. RN left generic message for patient at her listed number [phone]) - no name on voicemail, asked her to call her doctor's office regarding his question about her needing labs. Landis Gandy, RN

## 2016-10-14 ENCOUNTER — Telehealth: Payer: Self-pay | Admitting: *Deleted

## 2016-10-14 NOTE — Telephone Encounter (Signed)
Pharmacy unable to contact patient at listed number. They called to confirm contact information. RN attempted to return the call, was disconnected.  RN called patient, left generic message asking her to call her pharmacy for refills and to call her doctor's office for an appointment. Landis Gandy, RN

## 2016-11-04 ENCOUNTER — Other Ambulatory Visit: Payer: Self-pay | Admitting: *Deleted

## 2016-11-04 DIAGNOSIS — B20 Human immunodeficiency virus [HIV] disease: Secondary | ICD-10-CM

## 2016-11-04 MED ORDER — DOLUTEGRAVIR SODIUM 50 MG PO TABS: 50.0000 mg | ORAL_TABLET | Freq: Every day | ORAL | 3 refills | Status: DC

## 2017-01-23 DIAGNOSIS — F3173 Bipolar disorder, in partial remission, most recent episode manic: Secondary | ICD-10-CM | POA: Diagnosis not present

## 2017-03-16 DIAGNOSIS — F3173 Bipolar disorder, in partial remission, most recent episode manic: Secondary | ICD-10-CM | POA: Diagnosis not present

## 2017-04-07 DIAGNOSIS — F3173 Bipolar disorder, in partial remission, most recent episode manic: Secondary | ICD-10-CM | POA: Diagnosis not present

## 2017-05-05 DIAGNOSIS — F3173 Bipolar disorder, in partial remission, most recent episode manic: Secondary | ICD-10-CM | POA: Diagnosis not present

## 2017-05-09 DIAGNOSIS — F3173 Bipolar disorder, in partial remission, most recent episode manic: Secondary | ICD-10-CM | POA: Diagnosis not present

## 2017-05-22 ENCOUNTER — Other Ambulatory Visit: Payer: BLUE CROSS/BLUE SHIELD

## 2017-06-05 DIAGNOSIS — F3173 Bipolar disorder, in partial remission, most recent episode manic: Secondary | ICD-10-CM | POA: Diagnosis not present

## 2017-06-12 ENCOUNTER — Ambulatory Visit: Payer: BLUE CROSS/BLUE SHIELD | Admitting: Infectious Diseases

## 2017-06-20 ENCOUNTER — Encounter: Payer: Self-pay | Admitting: Infectious Diseases

## 2017-06-21 DIAGNOSIS — F3173 Bipolar disorder, in partial remission, most recent episode manic: Secondary | ICD-10-CM | POA: Diagnosis not present

## 2017-06-26 ENCOUNTER — Ambulatory Visit: Payer: BLUE CROSS/BLUE SHIELD | Admitting: Infectious Diseases

## 2017-07-06 ENCOUNTER — Other Ambulatory Visit: Payer: BLUE CROSS/BLUE SHIELD

## 2017-07-06 DIAGNOSIS — Z113 Encounter for screening for infections with a predominantly sexual mode of transmission: Secondary | ICD-10-CM | POA: Diagnosis not present

## 2017-07-06 DIAGNOSIS — B20 Human immunodeficiency virus [HIV] disease: Secondary | ICD-10-CM | POA: Diagnosis not present

## 2017-07-06 DIAGNOSIS — Z79899 Other long term (current) drug therapy: Secondary | ICD-10-CM | POA: Diagnosis not present

## 2017-07-07 LAB — COMPREHENSIVE METABOLIC PANEL
AG Ratio: 1.3 (calc) (ref 1.0–2.5)
ALBUMIN MSPROF: 4.2 g/dL (ref 3.6–5.1)
ALT: 10 U/L (ref 6–29)
AST: 11 U/L (ref 10–30)
Alkaline phosphatase (APISO): 54 U/L (ref 33–115)
BUN: 10 mg/dL (ref 7–25)
CHLORIDE: 102 mmol/L (ref 98–110)
CO2: 25 mmol/L (ref 20–32)
CREATININE: 0.75 mg/dL (ref 0.50–1.10)
Calcium: 9 mg/dL (ref 8.6–10.2)
GLOBULIN: 3.2 g/dL (ref 1.9–3.7)
Glucose, Bld: 110 mg/dL — ABNORMAL HIGH (ref 65–99)
POTASSIUM: 4 mmol/L (ref 3.5–5.3)
SODIUM: 134 mmol/L — AB (ref 135–146)
TOTAL PROTEIN: 7.4 g/dL (ref 6.1–8.1)
Total Bilirubin: 0.4 mg/dL (ref 0.2–1.2)

## 2017-07-07 LAB — CBC WITH DIFFERENTIAL/PLATELET
Basophils Absolute: 40 cells/uL (ref 0–200)
Basophils Relative: 0.6 %
EOS ABS: 60 {cells}/uL (ref 15–500)
Eosinophils Relative: 0.9 %
HEMATOCRIT: 36.9 % (ref 35.0–45.0)
Hemoglobin: 13 g/dL (ref 11.7–15.5)
LYMPHS ABS: 2144 {cells}/uL (ref 850–3900)
MCH: 30.1 pg (ref 27.0–33.0)
MCHC: 35.2 g/dL (ref 32.0–36.0)
MCV: 85.4 fL (ref 80.0–100.0)
MPV: 11.8 fL (ref 7.5–12.5)
Monocytes Relative: 4.2 %
NEUTROS PCT: 62.3 %
Neutro Abs: 4174 cells/uL (ref 1500–7800)
PLATELETS: 225 10*3/uL (ref 140–400)
RBC: 4.32 10*6/uL (ref 3.80–5.10)
RDW: 12.7 % (ref 11.0–15.0)
TOTAL LYMPHOCYTE: 32 %
WBC: 6.7 10*3/uL (ref 3.8–10.8)
WBCMIX: 281 {cells}/uL (ref 200–950)

## 2017-07-07 LAB — LIPID PANEL
CHOLESTEROL: 177 mg/dL (ref <200)
HDL: 32 mg/dL — AB (ref >50)
LDL CHOLESTEROL (CALC): 113 mg/dL — AB
Non-HDL Cholesterol (Calc): 145 mg/dL (calc) — ABNORMAL HIGH (ref <130)
TRIGLYCERIDES: 204 mg/dL — AB (ref <150)
Total CHOL/HDL Ratio: 5.5 (calc) — ABNORMAL HIGH (ref <5.0)

## 2017-07-07 LAB — T-HELPER CELL (CD4) - (RCID CLINIC ONLY)
CD4 T CELL ABS: 320 /uL — AB (ref 400–2700)
CD4 T CELL HELPER: 14 % — AB (ref 33–55)

## 2017-07-07 LAB — RPR: RPR: NONREACTIVE

## 2017-07-08 LAB — HIV-1 RNA QUANT-NO REFLEX-BLD
HIV 1 RNA QUANT: 1950 {copies}/mL — AB
HIV-1 RNA Quant, Log: 3.29 Log copies/mL — ABNORMAL HIGH

## 2017-07-20 ENCOUNTER — Encounter: Payer: Self-pay | Admitting: Infectious Diseases

## 2017-07-20 ENCOUNTER — Ambulatory Visit (INDEPENDENT_AMBULATORY_CARE_PROVIDER_SITE_OTHER): Payer: BLUE CROSS/BLUE SHIELD | Admitting: Infectious Diseases

## 2017-07-20 VITALS — BP 114/80 | HR 105 | Temp 98.6°F | Ht 61.0 in | Wt 120.0 lb

## 2017-07-20 DIAGNOSIS — B20 Human immunodeficiency virus [HIV] disease: Secondary | ICD-10-CM | POA: Diagnosis not present

## 2017-07-20 DIAGNOSIS — F319 Bipolar disorder, unspecified: Secondary | ICD-10-CM

## 2017-07-20 DIAGNOSIS — Z113 Encounter for screening for infections with a predominantly sexual mode of transmission: Secondary | ICD-10-CM | POA: Diagnosis not present

## 2017-07-20 DIAGNOSIS — N390 Urinary tract infection, site not specified: Secondary | ICD-10-CM | POA: Diagnosis not present

## 2017-07-20 DIAGNOSIS — Z23 Encounter for immunization: Secondary | ICD-10-CM | POA: Diagnosis not present

## 2017-07-20 DIAGNOSIS — Z79899 Other long term (current) drug therapy: Secondary | ICD-10-CM

## 2017-07-20 MED ORDER — NITROFURANTOIN MONOHYD MACRO 100 MG PO CAPS: 100.0000 mg | ORAL_CAPSULE | Freq: Two times a day (BID) | ORAL | 0 refills | Status: DC

## 2017-07-20 NOTE — Progress Notes (Signed)
   Subjective:    Patient ID: Laura Rogers, female    DOB: 15-Mar-1976, 41 y.o.   MRN: [id number]  HPI 41 yo F with HIV+, depression. Also with hx of cluster headaches.  Previously genvoya was changed to odefsy which helped her headaches somewhat. But has made her nauseated. Was on tivicay/descovy. States she feels better since she has been off medication.  Has been off rx for at least 6 months. "I'm just tired". Medicine makes her gain wt, "messes up my hair".  Also hx of CIN1, had colpo 04-2015. Negative.  Had PAP negative 11-2015.  Has been seeing psych- for biploar, taking riplan and xanax  Has been having pain after she urinates. No blood or cloudiness in urine. Has amonia smell in her underware.  Has suprapubic, sharp pain after she urinates.   HIV 1 RNA Quant (copies/mL)  Date Value  07/06/2017 1,950 (H)  04/13/2016 <20  09/16/2015 560 (H)   CD4 T Cell Abs (/uL)  Date Value  07/06/2017 320 (L)  04/13/2016 410  09/16/2015 340 (L)    Review of Systems  Constitutional: Negative for appetite change, chills, fever and unexpected weight change.  Respiratory: Negative for shortness of breath.   Gastrointestinal: Negative for constipation and diarrhea.  Endocrine: Negative for polyuria.  Genitourinary: Positive for difficulty urinating, dysuria and urgency. Negative for frequency.  Psychiatric/Behavioral: Negative for sleep disturbance.  Please see HPI. All other systems reviewed and negative.     Objective:   Physical Exam  Constitutional: She appears well-developed and well-nourished.  HENT:  Mouth/Throat: No oropharyngeal exudate.  Eyes: EOM are normal. Pupils are equal, round, and reactive to light.  Neck: Neck supple.  Cardiovascular: Normal rate, regular rhythm and normal heart sounds.  Pulmonary/Chest: Effort normal and breath sounds normal.  Abdominal: Soft. Bowel sounds are normal. There is no tenderness. There is no rebound.  Musculoskeletal: She exhibits no  edema.  Lymphadenopathy:    She has no cervical adenopathy.  Psychiatric: She has a normal mood and affect.          Assessment & Plan:

## 2017-07-20 NOTE — Assessment & Plan Note (Signed)
Will rx Nitrifurantoin.  Encourage fluids.

## 2017-07-20 NOTE — Assessment & Plan Note (Signed)
Appreciate psych f/u.

## 2017-07-20 NOTE — Assessment & Plan Note (Signed)
Will watch her off rx at her request.  Will set her up for PAP Given condoms.  Has gotten flu shot. rtc in 6 months.

## 2017-08-18 DIAGNOSIS — F3173 Bipolar disorder, in partial remission, most recent episode manic: Secondary | ICD-10-CM | POA: Diagnosis not present

## 2017-08-22 ENCOUNTER — Ambulatory Visit: Payer: BLUE CROSS/BLUE SHIELD | Admitting: Infectious Diseases

## 2017-08-24 ENCOUNTER — Ambulatory Visit: Payer: BLUE CROSS/BLUE SHIELD | Admitting: Infectious Diseases

## 2017-08-24 ENCOUNTER — Encounter: Payer: Self-pay | Admitting: Infectious Diseases

## 2017-09-13 DIAGNOSIS — F3173 Bipolar disorder, in partial remission, most recent episode manic: Secondary | ICD-10-CM | POA: Diagnosis not present

## 2017-10-16 ENCOUNTER — Encounter: Payer: Self-pay | Admitting: Infectious Diseases

## 2017-11-09 ENCOUNTER — Encounter: Payer: Self-pay | Admitting: Infectious Diseases

## 2017-11-15 ENCOUNTER — Ambulatory Visit: Payer: BLUE CROSS/BLUE SHIELD | Admitting: Infectious Diseases

## 2017-11-20 ENCOUNTER — Ambulatory Visit (INDEPENDENT_AMBULATORY_CARE_PROVIDER_SITE_OTHER): Payer: BLUE CROSS/BLUE SHIELD | Admitting: Infectious Diseases

## 2017-11-20 DIAGNOSIS — N870 Mild cervical dysplasia: Secondary | ICD-10-CM | POA: Diagnosis not present

## 2017-11-20 DIAGNOSIS — K5900 Constipation, unspecified: Secondary | ICD-10-CM

## 2017-11-20 DIAGNOSIS — B20 Human immunodeficiency virus [HIV] disease: Secondary | ICD-10-CM | POA: Diagnosis not present

## 2017-11-20 MED ORDER — PSYLLIUM 28 % PO PACK: 1.0000 | PACK | Freq: Every day | ORAL | 1 refills | Status: DC

## 2017-11-20 MED ORDER — BICTEGRAVIR-EMTRICITAB-TENOFOV 50-200-25 MG PO TABS: 1.0000 | ORAL_TABLET | Freq: Every day | ORAL | 3 refills | Status: DC

## 2017-11-20 MED FILL — BIKTARVY 50-200-25 MG TABS: 50-200-25 | 30 days supply | Qty: 30 | Fill #0

## 2017-11-20 NOTE — Assessment & Plan Note (Signed)
Trial of fiber supplement.

## 2017-11-20 NOTE — Assessment & Plan Note (Signed)
Will restart her art- biktarvy.  Given condoms Got flu shot Will see her back in 3 months Will get back in with dental after remodel.

## 2017-11-20 NOTE — Assessment & Plan Note (Signed)
Will get her set up for repeat PAP.

## 2017-11-20 NOTE — Progress Notes (Signed)
   Subjective:    Patient ID: Laura Rogers, female    DOB: [birth year], 42 y.o.   MRN: [id number]  HPI    Review of Systems     Objective:   Physical Exam        Assessment & Plan:

## 2017-11-20 NOTE — Progress Notes (Signed)
   Subjective:    Patient ID: Laura Rogers, female    DOB: Dec 02, 1975, 42 y.o.   MRN: [id number]  HPI 42yo F with HIV+, depression. Also with hx of cluster headaches.  Genvoya --> odefsy -->  Tivicay/descovy --> off.  Also hx ofCIN1, had colpo 04-2015. Negative.  Had PAP negative 11-2015.  Has been seeing psych- for biploar, taking riplan and xanax  Having stomach pains, sharp, diarrhea, for last several weeks. Had menses and pain did not change with this. Having to strain to move bowels alternating with loose BM. Has been taking tramadol to help her sleep.  Has been off meds for 6 months.  Has been "sleepy a lot".   HIV 1 RNA Quant (copies/mL)  Date Value  07/06/2017 1,950 (H)  04/13/2016 <20  09/16/2015 560 (H)   CD4 T Cell Abs (/uL)  Date Value  07/06/2017 320 (L)  04/13/2016 410  09/16/2015 340 (L)    Review of Systems  Constitutional: Positive for fatigue. Negative for appetite change and unexpected weight change.  Gastrointestinal: Positive for abdominal pain, constipation and diarrhea.  Endocrine: Negative for polyuria.  Genitourinary: Negative for difficulty urinating, dysuria and menstrual problem.  can't remember last PAP Please see HPI. All other systems reviewed and negative.      Objective:   Physical Exam  Constitutional: She appears well-developed and well-nourished.  HENT:  Mouth/Throat: No oropharyngeal exudate.  Eyes: EOM are normal. Pupils are equal, round, and reactive to light.  Neck: Neck supple.  Cardiovascular: Normal rate, regular rhythm and normal heart sounds.  Pulmonary/Chest: Effort normal and breath sounds normal.  Abdominal: Soft. Bowel sounds are normal. There is no tenderness. There is no rebound.  Musculoskeletal: She exhibits no edema.  Lymphadenopathy:    She has no cervical adenopathy.  Neurological: She is alert.  Psychiatric: She has a normal mood and affect.       Assessment & Plan:

## 2017-11-20 NOTE — Addendum Note (Signed)
Addended by: Dinah Beers on: 11/20/2017 10:53 AM   Modules accepted: Orders

## 2017-11-20 NOTE — Progress Notes (Signed)
HPI: Laura Rogers is a 42 y.o. female who is here for her HIV visit with Dr. Johnnye Sima.   Allergies: Allergies  Allergen Reactions  . Levofloxacin Shortness Of Breath and Other (See Comments)    Pt states she also has severe sweating, dehydration and bumbs on her tongue.  . Valacyclovir Hcl Anaphylaxis, Hives and Swelling    Also c/o purple legs when she stands. Sent to ed  . Sulfamethoxazole-Trimethoprim Rash  . Levofloxacin   . Penicillins Other (See Comments)    Childhood- unsure of reaction  . Bactrim Rash    Vitals: Temp: 98.3 F (36.8 C) (03/11 0907) Temp Source: Oral (03/11 0907) BP: 133/82 (03/11 0907) Pulse Rate: 71 (03/11 0907)  Past Medical History: Past Medical History:  Diagnosis Date  . HIV infection [facility])     Social History: Social History   Socioeconomic History  . Marital status: Married    Spouse name: Not on file  . Number of children: 0  . Years of education: 37  . Highest education level: Not on file  Social Needs  . Financial resource strain: Not on file  . Food insecurity - worry: Not on file  . Food insecurity - inability: Not on file  . Transportation needs - medical: Not on file  . Transportation needs - non-medical: Not on file  Occupational History  . Not on file  Tobacco Use  . Smoking status: Former Smoker    Packs/day: 0.10    Types: Cigarettes    Start date: 09/12/2012  . Smokeless tobacco: Never Used  Substance and Sexual Activity  . Alcohol use: Yes    Alcohol/week: 0.0 oz    Comment: occasional  . Drug use: Yes    Frequency: 7.0 times per week    Types: Marijuana  . Sexual activity: Yes    Partners: Male    Comment: accepted condoms  Other Topics Concern  . Not on file  Social History Narrative   Patient lives with Fiance lives in apartment.   Patient has highschool degree, works at [employer]   Patient is right handed.     Previous Regimen: Tivicay/Descovy, Atripla, Complera  Current Regimen: Off  Labs: HIV 1 RNA  Quant (copies/mL)  Date Value  07/06/2017 1,950 (H)  04/13/2016 <20  09/16/2015 560 (H)   CD4 T Cell Abs (/uL)  Date Value  07/06/2017 320 (L)  04/13/2016 410  09/16/2015 340 (L)   Hep B S Ab (no units)  Date Value  06/12/2013 NEG   Hepatitis B Surface Ag (no units)  Date Value  04/05/2011 NEGATIVE   HCV Ab (no units)  Date Value  04/05/2011 NEGATIVE    CrCl: CrCl cannot be calculated (Patient's most recent lab result is older than the maximum 21 days allowed.).  Lipids:    Component Value Date/Time   CHOL 177 07/06/2017 1526   TRIG 204 (H) 07/06/2017 1526   HDL 32 (L) 07/06/2017 1526   CHOLHDL 5.5 (H) 07/06/2017 1526   VLDL 36 (H) 09/16/2015 1359   LDLCALC 116 09/16/2015 1359    Assessment: Laura Rogers has been off of therapy now for about 8 months. She said that she didn't like the side effects when she was on Tivicay/Descovy because it made her drowsy. Counseled her on how much risk she would face with infections without therapy. She seems a bit more motivated now after some convincing from her mom. We will try to restart therapy with Biktarvy and told her to take it  at night instead.   She has insurance through [employer]? Therefore, we may have to send it to a specialty pharmacy. [facility] probably can fill it but will need PAF to help cover the copay.   Recommendations:  Biktarvy 1 PO qday   Onnie Boer, PharmD, BCPS, AAHIVP, CPP Clinical Infectious Disease [facility] 11/20/2017, 10:33 AM

## 2017-12-15 ENCOUNTER — Other Ambulatory Visit: Payer: Self-pay | Admitting: Pharmacist Clinician (PhC)/ Clinical Pharmacy Specialist

## 2017-12-15 DIAGNOSIS — B20 Human immunodeficiency virus [HIV] disease: Secondary | ICD-10-CM

## 2017-12-15 MED ORDER — BICTEGRAVIR-EMTRICITAB-TENOFOV 50-200-25 MG PO TABS: 1.0000 | ORAL_TABLET | Freq: Every day | ORAL | 1 refills | Status: DC

## 2017-12-15 NOTE — Progress Notes (Signed)
Have to be tx to [facility]

## 2018-01-30 ENCOUNTER — Ambulatory Visit (INDEPENDENT_AMBULATORY_CARE_PROVIDER_SITE_OTHER): Payer: BLUE CROSS/BLUE SHIELD | Admitting: Infectious Diseases

## 2018-01-30 ENCOUNTER — Encounter: Payer: Self-pay | Admitting: Infectious Diseases

## 2018-01-30 VITALS — BP 109/76 | HR 80 | Temp 98.0°F | Ht 61.0 in | Wt 138.0 lb

## 2018-01-30 DIAGNOSIS — B20 Human immunodeficiency virus [HIV] disease: Secondary | ICD-10-CM

## 2018-01-30 DIAGNOSIS — N870 Mild cervical dysplasia: Secondary | ICD-10-CM | POA: Diagnosis not present

## 2018-01-30 NOTE — Assessment & Plan Note (Signed)
Pharm will meet with her.  She defers labs today She defers prevnar today.  Will see her back in 4 months.

## 2018-01-30 NOTE — Assessment & Plan Note (Signed)
Will get her GYN.

## 2018-01-30 NOTE — Progress Notes (Signed)
   Subjective:    Patient ID: Laura Rogers, female    DOB: 1976-08-06, 42 y.o.   MRN: [id number]  HPI 42yo F with HIV+, depression. Also with hx of cluster headaches.  Genvoya --> odefsy -->  Tivicay/descovy --> off --> biktravy.  Also hx ofCIN1, had colpo 04-2015. Negative.  Had PAP negative 11-2015.Needs apt for repeat.  Has been seeing psych- for biploar, taking riplan and xanax  Was off meds at her last visit, started biktarvy after counseling with pharm. She is not clear of the status of her rx.  Had some nausea at first. Has since improved.  Her prev abd pain has improved.   HIV 1 RNA Quant (copies/mL)  Date Value  07/06/2017 1,950 (H)  04/13/2016 <20  09/16/2015 560 (H)   CD4 T Cell Abs (/uL)  Date Value  07/06/2017 320 (L)  04/13/2016 410  09/16/2015 340 (L)    Review of Systems  Constitutional: Negative for appetite change and unexpected weight change.  Respiratory: Negative for shortness of breath.   Gastrointestinal: Negative for diarrhea and nausea.  Genitourinary: Negative for difficulty urinating and menstrual problem.  Psychiatric/Behavioral: Negative for sleep disturbance.  Please see HPI. All other systems reviewed and negative.      Objective:   Physical Exam  Constitutional: She is oriented to person, place, and time. She appears well-developed and well-nourished.  HENT:  Mouth/Throat: No oropharyngeal exudate.  Eyes: Pupils are equal, round, and reactive to light. EOM are normal.  Neck: Normal range of motion. Neck supple.  Cardiovascular: Normal rate, regular rhythm and normal heart sounds.  Pulmonary/Chest: Effort normal and breath sounds normal.  Abdominal: Soft. Bowel sounds are normal. She exhibits no distension. There is no tenderness.  Musculoskeletal: She exhibits no edema.  Lymphadenopathy:    She has no cervical adenopathy.  Neurological: She is alert and oriented to person, place, and time.  Psychiatric: She has a normal mood and  affect.          Assessment & Plan:

## 2018-01-31 NOTE — Progress Notes (Signed)
HPI: Laura Rogers is a 42 y.o. female who is here to see Dr. Johnnye Sima for her HIV  Allergies: Allergies  Allergen Reactions  . Levofloxacin Shortness Of Breath and Other (See Comments)    Pt states she also has severe sweating, dehydration and bumbs on her tongue.  . Valacyclovir Hcl Anaphylaxis, Hives and Swelling    Also c/o purple legs when she stands. Sent to ed  . Sulfamethoxazole-Trimethoprim Rash  . Levofloxacin   . Penicillins Other (See Comments)    Childhood- unsure of reaction  . Bactrim Rash    Vitals:    Past Medical History: Past Medical History:  Diagnosis Date  . HIV infection [facility])     Social History: Social History   Socioeconomic History  . Marital status: Married    Spouse name: Not on file  . Number of children: 0  . Years of education: 74  . Highest education level: Not on file  Occupational History  . Not on file  Social Needs  . Financial resource strain: Not on file  . Food insecurity:    Worry: Not on file    Inability: Not on file  . Transportation needs:    Medical: Not on file    Non-medical: Not on file  Tobacco Use  . Smoking status: Former Smoker    Packs/day: 0.10    Types: Cigarettes    Start date: 09/12/2012  . Smokeless tobacco: Never Used  Substance and Sexual Activity  . Alcohol use: Yes    Alcohol/week: 0.0 oz    Comment: occasional  . Drug use: Yes    Frequency: 7.0 times per week    Types: Marijuana  . Sexual activity: Yes    Partners: Male    Comment: accepted condoms  Lifestyle  . Physical activity:    Days per week: Not on file    Minutes per session: Not on file  . Stress: Not on file  Relationships  . Social connections:    Talks on phone: Not on file    Gets together: Not on file    Attends religious service: Not on file    Active member of club or organization: Not on file    Attends meetings of clubs or organizations: Not on file    Relationship status: Not on file  Other Topics Concern  . Not on  file  Social History Narrative   Patient lives with Fiance lives in apartment.   Patient has highschool degree, works at [employer]   Patient is right handed.     Previous Regimen: Atripla, Complera, Stribild, Genvoya  Current Regimen: Biktarvy  Labs: HIV 1 RNA Quant (copies/mL)  Date Value  07/06/2017 1,950 (H)  04/13/2016 <20  09/16/2015 560 (H)   CD4 T Cell Abs (/uL)  Date Value  07/06/2017 320 (L)  04/13/2016 410  09/16/2015 340 (L)   Hep B S Ab (no units)  Date Value  06/12/2013 NEG   Hepatitis B Surface Ag (no units)  Date Value  04/05/2011 NEGATIVE   HCV Ab (no units)  Date Value  04/05/2011 NEGATIVE    CrCl: CrCl cannot be calculated (Patient's most recent lab result is older than the maximum 21 days allowed.).  Lipids:    Component Value Date/Time   CHOL 177 07/06/2017 1526   TRIG 204 (H) 07/06/2017 1526   HDL 32 (L) 07/06/2017 1526   CHOLHDL 5.5 (H) 07/06/2017 1526   VLDL 36 (H) 09/16/2015 1359   LDLCALC 113 (H)  07/06/2017 1526    Assessment: Laura Rogers is here to see Dr. Johnnye Sima for her HIV visit. She was doing well part of the time. At one point, she asked to be off of therapy. Dr. Johnnye Sima stated that he has some questions about the cost of meds. I remember that we had to send her rx to [facility]. She mentioned something about a letter that said approval for a certain amount. I have no idea what that it because she has active Pharmacist, community. The copay card would cover the cost of meds. She has been getting 3 month supply from [facility]. Supposedly, she has been taking meds but refused labs today until the next visit. Tried to convinced her several times but she still refused.   Recommendations:  Continue Biktarvy 1 daily Labs before the next visit  Onnie Boer, PharmD, BCPS, AAHIVP, CPP Clinical Infectious [facility] 01/31/2018, 9:29 AM

## 2018-04-24 NOTE — Telephone Encounter (Signed)
RN called patient regarding her MyChart message, got voicemail. RN stated that we did not have any walk-in appointments available today, advised her to contact her primary care or urgent care. RN noted patient is due for a visit, asked her to call for an appointment with Dr Johnnye Sima - first available is the first week of September. Landis Gandy, RN

## 2018-07-11 ENCOUNTER — Ambulatory Visit: Payer: BLUE CROSS/BLUE SHIELD | Admitting: Infectious Diseases

## 2018-07-13 ENCOUNTER — Encounter

## 2018-07-13 ENCOUNTER — Other Ambulatory Visit: Payer: BLUE CROSS/BLUE SHIELD

## 2018-07-27 ENCOUNTER — Encounter: Payer: BLUE CROSS/BLUE SHIELD | Admitting: Infectious Diseases

## 2018-07-31 ENCOUNTER — Ambulatory Visit: Payer: BLUE CROSS/BLUE SHIELD | Admitting: Infectious Diseases

## 2018-09-04 ENCOUNTER — Other Ambulatory Visit: Payer: Self-pay

## 2018-09-04 ENCOUNTER — Emergency Department (HOSPITAL_COMMUNITY)
Admission: EM | Admit: 2018-09-04 | Discharge: 2018-09-04 | Disposition: A | Discharge status: Home / Self Care | Payer: BLUE CROSS/BLUE SHIELD | Attending: Emergency Medicine | Admitting: Emergency Medicine

## 2018-09-04 DIAGNOSIS — Y998 Other external cause status: Secondary | ICD-10-CM | POA: Insufficient documentation

## 2018-09-04 DIAGNOSIS — M25552 Pain in left hip: Secondary | ICD-10-CM | POA: Diagnosis not present

## 2018-09-04 DIAGNOSIS — R51 Headache: Secondary | ICD-10-CM | POA: Insufficient documentation

## 2018-09-04 DIAGNOSIS — R519 Headache, unspecified: Secondary | ICD-10-CM

## 2018-09-04 DIAGNOSIS — S0990XA Unspecified injury of head, initial encounter: Secondary | ICD-10-CM | POA: Diagnosis present

## 2018-09-04 DIAGNOSIS — M25521 Pain in right elbow: Secondary | ICD-10-CM | POA: Diagnosis not present

## 2018-09-04 DIAGNOSIS — Z79899 Other long term (current) drug therapy: Secondary | ICD-10-CM | POA: Insufficient documentation

## 2018-09-04 DIAGNOSIS — Z87891 Personal history of nicotine dependence: Secondary | ICD-10-CM | POA: Diagnosis not present

## 2018-09-04 DIAGNOSIS — B20 Human immunodeficiency virus [HIV] disease: Secondary | ICD-10-CM | POA: Insufficient documentation

## 2018-09-04 DIAGNOSIS — Y92410 Unspecified street and highway as the place of occurrence of the external cause: Secondary | ICD-10-CM | POA: Insufficient documentation

## 2018-09-04 DIAGNOSIS — Y939 Activity, unspecified: Secondary | ICD-10-CM | POA: Diagnosis not present

## 2018-09-04 DIAGNOSIS — M25522 Pain in left elbow: Secondary | ICD-10-CM | POA: Diagnosis not present
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
## 2018-09-04 NOTE — Discharge Instructions (Addendum)
You may take your normal prescription of tramadol to help with your symptoms of pain.  Please make an appointment to follow-up with your regular doctor in 1 week for reevaluation and return to the ER for new or worsening symptoms in the meantime.
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
## 2018-09-18 ENCOUNTER — Other Ambulatory Visit: Payer: BLUE CROSS/BLUE SHIELD

## 2018-09-26 ENCOUNTER — Other Ambulatory Visit: Payer: Self-pay

## 2018-09-26 ENCOUNTER — Other Ambulatory Visit: Payer: Self-pay | Admitting: Infectious Diseases

## 2018-09-26 DIAGNOSIS — A6000 Herpesviral infection of urogenital system, unspecified: Secondary | ICD-10-CM

## 2018-09-26 MED ORDER — ACYCLOVIR 5 % EX OINT: 1.0000 "application " | TOPICAL_OINTMENT | CUTANEOUS | 0 refills | Status: DC

## 2018-09-26 NOTE — Progress Notes (Signed)
Patient called requesting refill on zovirax ointment. Medication refill sent to [facility] on [address] as patient requested. Confirmed next follow up appointment. S.Alayla Dethlefs, LPN

## 2018-10-02 ENCOUNTER — Telehealth: Payer: Self-pay | Admitting: *Deleted

## 2018-10-02 ENCOUNTER — Ambulatory Visit: Payer: BLUE CROSS/BLUE SHIELD | Admitting: Infectious Diseases

## 2018-10-02 DIAGNOSIS — B20 Human immunodeficiency virus [HIV] disease: Secondary | ICD-10-CM

## 2018-10-02 MED ORDER — BICTEGRAVIR-EMTRICITAB-TENOFOV 50-200-25 MG PO TABS: 1.0000 | ORAL_TABLET | Freq: Every day | ORAL | 0 refills | Status: DC

## 2018-10-02 NOTE — Telephone Encounter (Signed)
Received fax asking for confirmation of HIV therapy from [facility] in [place], [place]. RN sent 30 day supply, as patient if overdue for visit, but has scheduled with Dr Johnnye Sima within next month.  Last labs 07/06/2017. Landis Gandy, RN

## 2018-10-18 ENCOUNTER — Other Ambulatory Visit: Payer: BLUE CROSS/BLUE SHIELD

## 2018-10-29 ENCOUNTER — Other Ambulatory Visit: Payer: BLUE CROSS/BLUE SHIELD

## 2018-11-01 ENCOUNTER — Ambulatory Visit: Payer: BLUE CROSS/BLUE SHIELD | Admitting: Infectious Diseases

## 2018-11-29 ENCOUNTER — Other Ambulatory Visit: Payer: BLUE CROSS/BLUE SHIELD

## 2018-11-29 ENCOUNTER — Other Ambulatory Visit: Payer: Self-pay

## 2018-11-29 DIAGNOSIS — B20 Human immunodeficiency virus [HIV] disease: Secondary | ICD-10-CM | POA: Diagnosis not present

## 2018-11-30 LAB — T-HELPER CELL (CD4) - (RCID CLINIC ONLY)
CD4 % Helper T Cell: 10 % — ABNORMAL LOW (ref 33–55)
CD4 T CELL ABS: 290 /uL — AB (ref 400–2700)

## 2018-12-04 LAB — HIV-1 RNA QUANT-NO REFLEX-BLD
HIV 1 RNA Quant: 19800 copies/mL — ABNORMAL HIGH
HIV-1 RNA Quant, Log: 4.3 Log copies/mL — ABNORMAL HIGH

## 2018-12-14 ENCOUNTER — Ambulatory Visit (INDEPENDENT_AMBULATORY_CARE_PROVIDER_SITE_OTHER): Payer: BLUE CROSS/BLUE SHIELD | Admitting: Infectious Diseases

## 2018-12-14 ENCOUNTER — Telehealth: Payer: Self-pay | Admitting: Pharmacy Technician

## 2018-12-14 ENCOUNTER — Encounter: Payer: Self-pay | Admitting: Infectious Diseases

## 2018-12-14 ENCOUNTER — Other Ambulatory Visit: Payer: Self-pay

## 2018-12-14 VITALS — BP 140/85 | HR 115 | Temp 99.0°F | Wt 141.0 lb

## 2018-12-14 DIAGNOSIS — G4700 Insomnia, unspecified: Secondary | ICD-10-CM | POA: Insufficient documentation

## 2018-12-14 DIAGNOSIS — F5104 Psychophysiologic insomnia: Secondary | ICD-10-CM | POA: Diagnosis not present

## 2018-12-14 DIAGNOSIS — N870 Mild cervical dysplasia: Secondary | ICD-10-CM | POA: Diagnosis not present

## 2018-12-14 DIAGNOSIS — B20 Human immunodeficiency virus [HIV] disease: Secondary | ICD-10-CM | POA: Diagnosis not present

## 2018-12-14 DIAGNOSIS — F319 Bipolar disorder, unspecified: Secondary | ICD-10-CM

## 2018-12-14 DIAGNOSIS — J302 Other seasonal allergic rhinitis: Secondary | ICD-10-CM

## 2018-12-14 MED ORDER — TRAZODONE HCL 50 MG PO TABS: 50.0000 mg | ORAL_TABLET | Freq: Every day | ORAL | 2 refills | Status: DC

## 2018-12-14 MED ORDER — EMTRICITAB-RILPIVIR-TENOFOV AF 200-25-25 MG PO TABS: 1.0000 | ORAL_TABLET | Freq: Every day | ORAL | 5 refills | Status: DC

## 2018-12-14 NOTE — Assessment & Plan Note (Signed)
Off her medications with manifesting anxiety.  I told her I cannot refill any of her psychiatric medications if she is in current care with a psychiatrist and encouraged her bradycardia check out to them.  Pacifically reinforced that we will not be supplying her Xanax.

## 2018-12-14 NOTE — Assessment & Plan Note (Signed)
She has had a periodic cough throughout the day for a few weeks with itchy eyes.  Her symptoms seem consistent with allergies.  I encouraged her to please treat with over-the-counter antihistamines such as Allegra.  Monitor for further symptom evolution.  She did not inform the front desk that her cough was present when she was given the coven prescreening as she was concerned she would not be seen.  I told her that she needs to be honest with her symptoms because we have the capability of doing an telephone or E visit.

## 2018-12-14 NOTE — Telephone Encounter (Signed)
[facility] Patient Advocate Encounter   Was successful in obtaining a [employer] copay card for [facility].  This copay card will make the patients copay 0.  I have spoken with the patient.    The billing information is as follows and has been shared with [place].  RxBin: [id number]  PCN: ACCESS Member ID: [id number] Group ID: [id number]  [place] Nadara Mustard [place] Patient [facility] for Infectious Disease Phone: [phone] Fax:  [phone]

## 2018-12-14 NOTE — Patient Instructions (Addendum)
For your allergies - try allegra once a day to help with the itchy eyes and cough.  As long as you are not developing any fevers or worsening symptoms I think all of this is allergy related.  Your HIV is please stop your Biktarvy.  I want to start you on a new medication called Odefsey.  This needs to be taken once a day with a full chewable meal.  I think you will tolerate this medicine better than the Biktarvy.  Give it about a month to decide if this is working well for you or not.  For sleep I sent in a medication called trazodone.  Please take 1 tablet at night for sleep.  If this does not work I would reach out to your psychiatrist to see if they have any options that would work better for you.  I do want to interact with your other medications and I think they would have a good opinion for you.  Please return to see Dr. Johnnye Sima or Colletta [place] in 2 months to follow-up on your new medicine and repeat your blood work.  If you have not had a Pap smear in the last 3 years please schedule a Pap smear at your convenience with [facility].

## 2018-12-14 NOTE — Progress Notes (Signed)
Name: Laura Rogers  DOB: 07-Feb-1976 MRN: [id number] PCP: London Pepper, MD    Patient Active Problem List   Diagnosis Date Noted   Insomnia 12/14/2018   Seasonal allergies 12/14/2018   Bipolar affective ([place]) 07/20/2017   Migraine without aura and without status migrainosus, not intractable 06/03/2015   CIN I (cervical intraepithelial neoplasia I) 05/04/2015   Migraine 04/06/2015   Herpes genitalia 04/09/2014   Other and unspecified hyperlipidemia 12/05/2012   Pneumocystis carinii pneumonia ([place]) 04/11/2011   Constipation 04/11/2011   HIV (human immunodeficiency virus infection) ([place]) 04/06/2011     Subjective:   Chief Complaint  Patient presents with   Anxiety   HIV Positive/AIDS    Routine follow-up.  Phillips Odor makes her nauseous and she is not taking her medicine    HPI:  Laura Rogers is here today for follow-up of her HIV disease.  She has been off of her [place] for several months now as this has been cost prohibitive for her with co-pay of $60 per day, and nausea that makes it very difficult to stay on this medicine.  Previous regimens include Atripla, [place], [place], [place], [place] Descovy.  She has missed a few office visits with Dr. Johnnye Sima.  She tells me he will be "pissed at her" because she is off her medicine.  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  She is very concerned about COVID-19 and the fact that she is not and has not been on her HIV medications regularly.  She would like to switch to something else as of the [place] Odor is just not for her.  She is requesting a refill on her Xanax and Zoloft.  Her anxiety is high and she is not able to get good sleep.  Really she does want something that will help her sleep.  She tells me that she has a psychiatrist who she is working with.  She has a history of CIN-1 status post colposcopy August 2016.  She has not had a Pap since 2017.  Depression screen PHQ 2/9  12/14/2018  Decreased Interest 1  Down, Depressed, Hopeless 1  PHQ - 2 Score 2  Altered sleeping 1  Tired, decreased energy 1  Change in appetite 1  Feeling bad or failure about yourself  1  Trouble concentrating 1  Moving slowly or fidgety/restless 1  Suicidal thoughts -  PHQ-9 Score 8  Difficult doing work/chores -    Review of Systems  All other systems reviewed and are negative. 12 point review of systems addressed.  Past Medical History:  Diagnosis Date   HIV infection ([place])     Outpatient Medications Prior to Visit  Medication Sig Dispense Refill   acyclovir ointment (ZOVIRAX) 5 % Apply 1 application topically every 3 (three) hours. 15 g 0   ALPRAZolam (XANAX) 0.5 MG tablet TAKE 1/2-1 TABLET BY MOUTH TWO TIMES DAILY AS NEEDED FOR ANXIETY  2   ondansetron (ZOFRAN) 4 MG tablet TAKE 1 TABLET(4 MG) BY MOUTH EVERY 8 HOURS AS NEEDED FOR NAUSEA OR VOMITING 20 tablet 0   psyllium (METAMUCIL SMOOTH TEXTURE) 28 % packet Take 1 packet by mouth daily. 30 tablet 1   risperiDONE (RISPERDAL) 2 MG tablet TAKE 1 TAB BY MOUTH EVERY NIGHT AT BEDTIME  2   traMADol (ULTRAM) 50 MG tablet Take 1 tablet (50 mg total) by mouth every 8 (eight) hours as needed. 30 tablet 0   bictegravir-emtricitabine-tenofovir AF (BIKTARVY) 50-200-25 MG TABS  tablet Take 1 tablet by mouth daily. 30 tablet 0   nitrofurantoin, macrocrystal-monohydrate, (MACROBID) 100 MG capsule Take 1 capsule (100 mg total) 2 (two) times daily by mouth. (Patient not taking: Reported on 12/14/2018) 6 capsule 0   sertraline (ZOLOFT) 25 MG tablet TAKE 1 TABLET BY MOUTH DAILY (Patient not taking: Reported on 12/14/2018) 90 tablet 1   temazepam (RESTORIL) 15 MG capsule Take 1 capsule (15 mg total) by mouth at bedtime as needed for sleep. (Patient not taking: Reported on 12/14/2018) 10 capsule 0   fluconazole (DIFLUCAN) 100 MG tablet Take 1 tablet (100 mg total) by mouth daily. (Patient not taking: Reported on 12/14/2018) 7 tablet 0   No  facility-administered medications prior to visit.      Allergies  Allergen Reactions   Levofloxacin Shortness Of Breath and Other (See Comments)    Pt states she also has severe sweating, dehydration and bumbs on her tongue.   Valacyclovir Hcl Anaphylaxis, Hives and Swelling    Also c/o purple legs when she stands. Sent to ed   Sulfamethoxazole-Trimethoprim Rash   Levofloxacin    Penicillins Other (See Comments)    Childhood- unsure of reaction   Bactrim Rash    Social History   Tobacco Use   Smoking status: Former Smoker    Packs/day: 0.10    Types: Cigarettes    Start date: 09/12/2012   Smokeless tobacco: Never Used  Substance Use Topics   Alcohol use: Yes    Alcohol/week: 0.0 standard drinks    Comment: occasional   Drug use: Yes    Frequency: 7.0 times per week    Types: Marijuana    Social History   Substance and Sexual Activity  Sexual Activity Yes   Partners: Male   Comment: accepted condoms     Objective:   Vitals:   12/14/18 1114  BP: 140/85  Pulse: (!) 115  Temp: 99 F (37.2 C)  Weight: 141 lb (64 kg)   Body mass index is 26.64 kg/m.  Physical Exam Constitutional:      Appearance: She is well-developed.     Comments: Seated comfortably in chair.  She is not in any physical distress.  She appears anxious.  HENT:     Mouth/Throat:     Mouth: No oral lesions.     Dentition: Normal dentition. No dental abscesses.     Pharynx: No oropharyngeal exudate.  Cardiovascular:     Rate and Rhythm: Normal rate and regular rhythm.     Heart sounds: Normal heart sounds.  Pulmonary:     Effort: Pulmonary effort is normal.     Breath sounds: Normal breath sounds.  Abdominal:     General: There is no distension.     Palpations: Abdomen is soft.     Tenderness: There is no abdominal tenderness.  Lymphadenopathy:     Cervical: No cervical adenopathy.  Skin:    General: Skin is warm and dry.     Findings: No rash.  Neurological:     Mental  Status: She is alert and oriented to person, place, and time.  Psychiatric:        Judgment: Judgment normal.     Comments: Although she is in good spirits, she has rapid speech and anxious mood.     Lab Results Lab Results  Component Value Date   WBC 6.7 07/06/2017   HGB 13.0 07/06/2017   HCT 36.9 07/06/2017   MCV 85.4 07/06/2017   PLT 225 07/06/2017  Lab Results  Component Value Date   CREATININE 0.75 07/06/2017   BUN 10 07/06/2017   NA 134 (L) 07/06/2017   K 4.0 07/06/2017   CL 102 07/06/2017   CO2 25 07/06/2017    Lab Results  Component Value Date   ALT 10 07/06/2017   AST 11 07/06/2017   ALKPHOS 64 05/04/2015   BILITOT 0.4 07/06/2017    Lab Results  Component Value Date   CHOL 177 07/06/2017   HDL 32 (L) 07/06/2017   LDLCALC 113 (H) 07/06/2017   TRIG 204 (H) 07/06/2017   CHOLHDL 5.5 (H) 07/06/2017   HIV 1 RNA Quant (copies/mL)  Date Value  11/29/2018 19,800 (H)  07/06/2017 1,950 (H)  04/13/2016 <20   CD4 T Cell Abs (/uL)  Date Value  11/29/2018 290 (L)  07/06/2017 320 (L)  04/13/2016 410     Assessment & Plan:   Problem List Items Addressed This Visit      Unprioritized   HIV (human immunodeficiency virus infection) ([place]) - Primary    I reviewed her lab work today.  She has had detectable virus for well over a year.  I told her that her CD4 count is not AIDS defining criteria, however will be if she continues on this path.  We discussed briefly [place] outbreak.  I do not think she should be in the work setting as it sounds like there are not ideal accommodations for people with an immunocompromise status.  I written her a letter with request to work from home or find an accommodation to work with the mask isolated from other employees.  She finds the Biktarvy intolerable and expensive.  We will try her on Odefsey once a day.  Stressed importance and counseled on proper use with full chewable meal.  Her last genotype was pansensitive without any  mutations that would prohibit use.  We will have her back in 2 months to repeat her viral load and check-in.  If need be we will run a genotype at that point.      Relevant Medications   emtricitabine-rilpivir-tenofovir AF (ODEFSEY) 200-25-25 MG TABS tablet   CIN I (cervical intraepithelial neoplasia I)    She will reschedule a Pap smear with me at her convenience.  Discussed that she needs lifelong screenings and an interval of at least yearly.      Bipolar affective ([place])    Off her medications with manifesting anxiety.  I told her I cannot refill any of her psychiatric medications if she is in current care with a psychiatrist and encouraged her bradycardia check out to them.  Pacifically reinforced that we will not be supplying her Xanax.      Insomnia    We will trial her on low-dose trazodone.  I asked her to otherwise follow up with her psychiatric team for recommendations and further refills.      Seasonal allergies    She has had a periodic cough throughout the day for a few weeks with itchy eyes.  Her symptoms seem consistent with allergies.  I encouraged her to please treat with over-the-counter antihistamines such as Allegra.  Monitor for further symptom evolution.  She did not inform the front desk that her cough was present when she was given the coven prescreening as she was concerned she would not be seen.  I told her that she needs to be honest with her symptoms because we have the capability of doing an telephone or E visit.  Other Visit Diagnoses    AIDS ([place])       Relevant Medications   emtricitabine-rilpivir-tenofovir AF (ODEFSEY) 200-25-25 MG TABS tablet     Return to see Dr. Johnnye Sima in 2 months.  Work note provided.  Janene Madeira, MSN, NP-C [facility] [place] Pager: [phone] Office: [phone]  12/14/18  3:48 PM

## 2018-12-14 NOTE — Assessment & Plan Note (Signed)
We will trial her on low-dose trazodone.  I asked her to otherwise follow up with her psychiatric team for recommendations and further refills.

## 2018-12-14 NOTE — Assessment & Plan Note (Addendum)
I reviewed her lab work today.  She has had detectable virus for well over a year.  I told her that her CD4 count is not AIDS defining criteria, however will be if she continues on this path.  We discussed briefly [place] outbreak.  I do not think she should be in the work setting as it sounds like there are not ideal accommodations for people with an immunocompromise status.  I written her a letter with request to work from home or find an accommodation to work with the mask isolated from other employees.  She finds the Biktarvy intolerable and expensive.  We will try her on Odefsey once a day.  Stressed importance and counseled on proper use with full chewable meal.  Her last genotype was pansensitive without any mutations that would prohibit use.  We will have her back in 2 months to repeat her viral load and check-in.  If need be we will run a genotype at that point.

## 2018-12-14 NOTE — Assessment & Plan Note (Signed)
She will reschedule a Pap smear with me at her convenience.  Discussed that she needs lifelong screenings and an interval of at least yearly.

## 2019-02-14 ENCOUNTER — Other Ambulatory Visit: Payer: Self-pay | Admitting: *Deleted

## 2019-02-14 DIAGNOSIS — B20 Human immunodeficiency virus [HIV] disease: Secondary | ICD-10-CM

## 2019-02-14 DIAGNOSIS — Z113 Encounter for screening for infections with a predominantly sexual mode of transmission: Secondary | ICD-10-CM

## 2019-02-14 DIAGNOSIS — Z79899 Other long term (current) drug therapy: Secondary | ICD-10-CM

## 2019-02-18 ENCOUNTER — Other Ambulatory Visit: Payer: BLUE CROSS/BLUE SHIELD

## 2019-02-19 ENCOUNTER — Other Ambulatory Visit: Payer: BC Managed Care – PPO

## 2019-02-20 ENCOUNTER — Other Ambulatory Visit: Payer: BC Managed Care – PPO

## 2019-02-20 ENCOUNTER — Other Ambulatory Visit: Payer: Self-pay

## 2019-02-20 DIAGNOSIS — Z113 Encounter for screening for infections with a predominantly sexual mode of transmission: Secondary | ICD-10-CM

## 2019-02-20 DIAGNOSIS — B20 Human immunodeficiency virus [HIV] disease: Secondary | ICD-10-CM

## 2019-02-21 LAB — T-HELPER CELL (CD4) - (RCID CLINIC ONLY)
CD4 % Helper T Cell: 13 % — ABNORMAL LOW (ref 33–65)
CD4 T Cell Abs: 325 /uL — ABNORMAL LOW (ref 400–1790)

## 2019-03-02 LAB — COMPLETE METABOLIC PANEL WITH GFR
AG Ratio: 1.5 (calc) (ref 1.0–2.5)
ALT: 25 U/L (ref 6–29)
AST: 20 U/L (ref 10–30)
Albumin: 4.1 g/dL (ref 3.6–5.1)
Alkaline phosphatase (APISO): 57 U/L (ref 31–125)
BUN: 14 mg/dL (ref 7–25)
CO2: 24 mmol/L (ref 20–32)
Calcium: 8.7 mg/dL (ref 8.6–10.2)
Chloride: 105 mmol/L (ref 98–110)
Creat: 0.98 mg/dL (ref 0.50–1.10)
GFR, Est African American: 82 mL/min/{1.73_m2} (ref >=60)
GFR, Est Non African American: 71 mL/min/{1.73_m2} (ref >=60)
Globulin: 2.8 g/dL (calc) (ref 1.9–3.7)
Glucose, Bld: 119 mg/dL — ABNORMAL HIGH (ref 65–99)
Potassium: 4.2 mmol/L (ref 3.5–5.3)
Sodium: 136 mmol/L (ref 135–146)
Total Bilirubin: 0.3 mg/dL (ref 0.2–1.2)
Total Protein: 6.9 g/dL (ref 6.1–8.1)

## 2019-03-02 LAB — HIV-1 RNA QUANT-NO REFLEX-BLD
HIV 1 RNA Quant: <20 copies/mL — AB
HIV-1 RNA Quant, Log: <1.3 Log copies/mL — AB

## 2019-03-02 LAB — CBC WITH DIFFERENTIAL/PLATELET
Absolute Monocytes: 454 cells/uL (ref 200–950)
Basophils Absolute: 53 cells/uL (ref 0–200)
Basophils Relative: 0.9 %
Eosinophils Absolute: 100 cells/uL (ref 15–500)
Eosinophils Relative: 1.7 %
HCT: 38.8 % (ref 35.0–45.0)
Hemoglobin: 13.9 g/dL (ref 11.7–15.5)
Lymphs Abs: 2767 cells/uL (ref 850–3900)
MCH: 31.4 pg (ref 27.0–33.0)
MCHC: 35.8 g/dL (ref 32.0–36.0)
MCV: 87.6 fL (ref 80.0–100.0)
MPV: 12.4 fL (ref 7.5–12.5)
Monocytes Relative: 7.7 %
Neutro Abs: 2525 cells/uL (ref 1500–7800)
Neutrophils Relative %: 42.8 %
Platelets: 249 10*3/uL (ref 140–400)
RBC: 4.43 10*6/uL (ref 3.80–5.10)
RDW: 14.2 % (ref 11.0–15.0)
Total Lymphocyte: 46.9 %
WBC: 5.9 10*3/uL (ref 3.8–10.8)

## 2019-03-02 LAB — RPR: RPR Ser Ql: NONREACTIVE

## 2019-03-04 ENCOUNTER — Telehealth: Payer: Self-pay | Admitting: Infectious Diseases

## 2019-03-04 NOTE — Telephone Encounter (Signed)
COVID-19 Pre-Screening Questions:  Do you currently have a fever (>100 F), chills or unexplained body aches? No   Are you currently experiencing new cough, shortness of breath, sore throat, runny nose? No    Have you recently travelled outside the state of [place] in the last 14 days? No    Have you been in contact with someone that is currently pending confirmation of Covid19 testing or has been confirmed to have the [place] virus?  No

## 2019-03-05 ENCOUNTER — Other Ambulatory Visit: Payer: Self-pay

## 2019-03-05 ENCOUNTER — Ambulatory Visit (INDEPENDENT_AMBULATORY_CARE_PROVIDER_SITE_OTHER): Payer: BC Managed Care – PPO | Admitting: Infectious Diseases

## 2019-03-05 ENCOUNTER — Encounter: Payer: Self-pay | Admitting: Infectious Diseases

## 2019-03-05 VITALS — BP 118/80 | HR 93 | Temp 98.0°F | Ht 61.0 in | Wt 138.0 lb

## 2019-03-05 DIAGNOSIS — Z113 Encounter for screening for infections with a predominantly sexual mode of transmission: Secondary | ICD-10-CM | POA: Diagnosis not present

## 2019-03-05 DIAGNOSIS — R110 Nausea: Secondary | ICD-10-CM | POA: Insufficient documentation

## 2019-03-05 DIAGNOSIS — A6000 Herpesviral infection of urogenital system, unspecified: Secondary | ICD-10-CM

## 2019-03-05 DIAGNOSIS — N870 Mild cervical dysplasia: Secondary | ICD-10-CM | POA: Diagnosis not present

## 2019-03-05 DIAGNOSIS — B20 Human immunodeficiency virus [HIV] disease: Secondary | ICD-10-CM | POA: Diagnosis not present

## 2019-03-05 DIAGNOSIS — Z79899 Other long term (current) drug therapy: Secondary | ICD-10-CM

## 2019-03-05 MED ORDER — ONDANSETRON HCL 4 MG PO TABS: 4.0000 mg | ORAL_TABLET | Freq: Three times a day (TID) | ORAL | 0 refills | Status: DC | PRN

## 2019-03-05 NOTE — Assessment & Plan Note (Signed)
Will refill zofran.  Will have her GI.

## 2019-03-05 NOTE — Progress Notes (Signed)
   Subjective:    Patient ID: Laura Rogers, female    DOB: 1976/08/05, 43 y.o.   MRN: [id number]  HPI 43yo F with HIV+, depression. Also with hx of cluster headaches.  Genvoya --> odefsy -->Tivicay/descovy --> off --> biktravy. At her last visit (12-2018) she was changed to [facility].   Also hx ofCIN1, had colpo 04-2015. Negative.  Had PAP negative 11-2015.Needs apt for repeat.   Has been seeing psych- for biploar, taking riplan and xanax  She continues to have nausea. Takes her ART in the afternoon and she attributes her nausea to her ART. She is out of nausea rx.  Still working at home.    HIV 1 RNA Quant (copies/mL)  Date Value  02/20/2019 <20 DETECTED (A)  11/29/2018 19,800 (H)  07/06/2017 1,950 (H)   CD4 T Cell Abs (/uL)  Date Value  02/20/2019 325 (L)  11/29/2018 290 (L)  07/06/2017 320 (L)    Review of Systems  Constitutional: Negative for appetite change, chills, fever and unexpected weight change.  Respiratory: Negative for cough and shortness of breath.   Gastrointestinal: Positive for nausea. Negative for constipation, diarrhea and vomiting.  Psychiatric/Behavioral: Negative for dysphoric mood and sleep disturbance.  mood has been "up and down".  Has not had PAP.  Please see HPI. All other systems reviewed and negative.      Objective:   Physical Exam Constitutional:      Appearance: Normal appearance.  HENT:     Mouth/Throat:     Mouth: Mucous membranes are moist.     Pharynx: No oropharyngeal exudate.  Eyes:     Extraocular Movements: Extraocular movements intact.     Pupils: Pupils are equal, round, and reactive to light.  Neck:     Musculoskeletal: Normal range of motion. No neck rigidity.  Cardiovascular:     Rate and Rhythm: Normal rate and regular rhythm.  Pulmonary:     Effort: Pulmonary effort is normal.     Breath sounds: Normal breath sounds.  Abdominal:     General: Abdomen is flat. Bowel sounds are normal. There is no distension.     Tenderness: There is no abdominal tenderness.  Musculoskeletal: Normal range of motion.     Right lower leg: No edema.     Left lower leg: No edema.  Lymphadenopathy:     Cervical: No cervical adenopathy.  Neurological:     General: No focal deficit present.     Mental Status: She is alert.  Psychiatric:        Mood and Affect: Mood normal.       Assessment & Plan:

## 2019-03-05 NOTE — Assessment & Plan Note (Addendum)
She is doing well Taking rx well.  Still some nausea.  Encouraged to take rx with food.  rtc in 9 months.  Dental eval Given condoms

## 2019-03-05 NOTE — Assessment & Plan Note (Signed)
Will get her appt with NP

## 2019-03-05 NOTE — Assessment & Plan Note (Signed)
Not currently active.

## 2019-04-24 ENCOUNTER — Encounter: Payer: Self-pay | Admitting: Physician Assistant

## 2019-04-24 ENCOUNTER — Ambulatory Visit (INDEPENDENT_AMBULATORY_CARE_PROVIDER_SITE_OTHER): Payer: BC Managed Care – PPO | Admitting: Physician Assistant

## 2019-04-24 ENCOUNTER — Other Ambulatory Visit: Payer: Self-pay

## 2019-04-24 DIAGNOSIS — F411 Generalized anxiety disorder: Secondary | ICD-10-CM | POA: Diagnosis not present

## 2019-04-24 DIAGNOSIS — F5105 Insomnia due to other mental disorder: Secondary | ICD-10-CM

## 2019-04-24 DIAGNOSIS — F314 Bipolar disorder, current episode depressed, severe, without psychotic features: Secondary | ICD-10-CM | POA: Diagnosis not present

## 2019-04-24 DIAGNOSIS — F99 Mental disorder, not otherwise specified: Secondary | ICD-10-CM

## 2019-04-24 MED ORDER — RISPERIDONE 0.5 MG PO TABS: ORAL_TABLET | ORAL | 1 refills | Status: DC

## 2019-04-24 MED ORDER — ALPRAZOLAM 0.5 MG PO TABS: 0.5000 mg | ORAL_TABLET | Freq: Three times a day (TID) | ORAL | 1 refills | Status: DC | PRN
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
## 2019-05-02 ENCOUNTER — Other Ambulatory Visit: Payer: Self-pay

## 2019-05-02 ENCOUNTER — Ambulatory Visit (INDEPENDENT_AMBULATORY_CARE_PROVIDER_SITE_OTHER): Payer: BC Managed Care – PPO | Admitting: Mental Health

## 2019-05-02 DIAGNOSIS — F411 Generalized anxiety disorder: Secondary | ICD-10-CM | POA: Diagnosis not present
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
## 2019-05-16 ENCOUNTER — Ambulatory Visit (INDEPENDENT_AMBULATORY_CARE_PROVIDER_SITE_OTHER): Payer: BC Managed Care – PPO | Admitting: Mental Health

## 2019-05-16 ENCOUNTER — Other Ambulatory Visit: Payer: Self-pay

## 2019-05-16 DIAGNOSIS — F411 Generalized anxiety disorder: Secondary | ICD-10-CM
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
## 2019-05-17 ENCOUNTER — Other Ambulatory Visit: Payer: Self-pay

## 2019-05-17 ENCOUNTER — Ambulatory Visit (INDEPENDENT_AMBULATORY_CARE_PROVIDER_SITE_OTHER): Payer: BC Managed Care – PPO | Admitting: Physician Assistant

## 2019-05-17 ENCOUNTER — Encounter: Payer: Self-pay | Admitting: Physician Assistant

## 2019-05-17 DIAGNOSIS — F411 Generalized anxiety disorder: Secondary | ICD-10-CM | POA: Diagnosis not present

## 2019-05-17 DIAGNOSIS — F314 Bipolar disorder, current episode depressed, severe, without psychotic features: Secondary | ICD-10-CM | POA: Diagnosis not present

## 2019-05-17 DIAGNOSIS — F99 Mental disorder, not otherwise specified: Secondary | ICD-10-CM

## 2019-05-17 DIAGNOSIS — F5105 Insomnia due to other mental disorder: Secondary | ICD-10-CM | POA: Diagnosis not present

## 2019-05-17 MED ORDER — RISPERIDONE 2 MG PO TABS: 2.0000 mg | ORAL_TABLET | Freq: Every day | ORAL | 1 refills | Status: DC

## 2019-05-17 MED ORDER — TRAZODONE HCL 50 MG PO TABS: 50.0000 mg | ORAL_TABLET | Freq: Every evening | ORAL | 1 refills | Status: DC | PRN

## 2019-05-17 NOTE — Progress Notes (Signed)
[facility]  Patient ID: Laura Rogers,  MRN: [id number]  PCP: London Pepper, MD  Date of Evaluation: 05/17/2019 Time spent:15 minutes  Chief Complaint:  Chief Complaint    Anxiety     Virtual Visit via Telephone Note  I connected with patient by a video enabled telemedicine application or telephone, with their informed consent, and verified patient privacy and that I am speaking with the correct person using two identifiers.  I am private, in my home and the patient is home.   I discussed the limitations, risks, security and privacy concerns of performing an evaluation and management service by telephone and the availability of in person appointments. I also discussed with the patient that there may be a patient responsible charge related to this service. The patient expressed understanding and agreed to proceed.   I discussed the assessment and treatment plan with the patient. The patient was provided an opportunity to ask questions and all were answered. The patient agreed with the plan and demonstrated an understanding of the instructions.   The patient was advised to call back or seek an in-person evaluation if the symptoms worsen or if the condition fails to improve as anticipated.  I provided 15 minutes of non-face-to-face time during this encounter.  HISTORY/CURRENT STATUS: HPI for routine med check.   Since her last visit about 3 weeks ago, she feels only slightly better.  The anxiety is still an issue and she is not sleeping well at all.  She does not feel like doing anything.  Energy and motivation are low.  She cries easily.  Her mind races a lot, worse in the evenings and she has trouble falling asleep and staying asleep.  She will wake up thinking about everything that is going on and how to solve the problems and cannot go back to sleep.  She is having panic attacks a couple of times a week.  The Xanax does help.  She is not only using it for anxiety but to  help her go to sleep as well.  It is not as effective for the sleep as it is for the anxiety.  Because of feeling so bad, she has trouble doing things around the house. Sometimes it is difficult for her to even want to take a shower.  She does the least she can get by with concerning housework or errands.  Patient denies increased energy with decreased need for sleep, no increased talkativeness, no racing thoughts, no impulsivity or risky behaviors, no increased spending, no increased libido, no grandiosity.  Denies dizziness, syncope, seizures, numbness, tingling, tremor, tics, unsteady gait, slurred speech, confusion. Denies muscle or joint pain, stiffness, or dystonia.  Individual Medical History/ Review of Systems: Changes? :No    Past medications for mental health diagnoses include: Risperdal, Zoloft, Restoril, trazodone, Xanax  Allergies: Levofloxacin, Valacyclovir hcl, Sulfamethoxazole-trimethoprim, Levofloxacin, Penicillins, and Bactrim  Current Medications:  Current Outpatient Medications:  .  ALPRAZolam (XANAX) 0.5 MG tablet, Take 1 tablet (0.5 mg total) by mouth 3 (three) times daily as needed for anxiety., Disp: 90 tablet, Rfl: 1 .  psyllium (METAMUCIL SMOOTH TEXTURE) 28 % packet, Take 1 packet by mouth daily., Disp: 30 tablet, Rfl: 1 .  acyclovir ointment (ZOVIRAX) 5 %, Apply 1 application topically every 3 (three) hours. (Patient not taking: Reported on 04/24/2019), Disp: 15 g, Rfl: 0 .  emtricitabine-rilpivir-tenofovir AF (ODEFSEY) 200-25-25 MG TABS tablet, Take 1 tablet by mouth daily with breakfast. Try to take at the same time  each day with a full meal (Patient not taking: Reported on 05/17/2019), Disp: 30 tablet, Rfl: 5 .  ondansetron (ZOFRAN) 4 MG tablet, TAKE 1 TABLET(4 MG) BY MOUTH EVERY 8 HOURS AS NEEDED FOR NAUSEA OR VOMITING (Patient not taking: Reported on 05/17/2019), Disp: 20 tablet, Rfl: 0 .  ondansetron (ZOFRAN) 4 MG tablet, Take 1 tablet (4 mg total) by mouth every 8  (eight) hours as needed for nausea or vomiting. (Patient not taking: Reported on 05/17/2019), Disp: 20 tablet, Rfl: 0 .  risperiDONE (RISPERDAL) 2 MG tablet, Take 1 tablet (2 mg total) by mouth at bedtime., Disp: 30 tablet, Rfl: 1 .  temazepam (RESTORIL) 15 MG capsule, Take 1 capsule (15 mg total) by mouth at bedtime as needed for sleep. (Patient not taking: Reported on 03/05/2019), Disp: 10 capsule, Rfl: 0 .  traMADol (ULTRAM) 50 MG tablet, Take 1 tablet (50 mg total) by mouth every 8 (eight) hours as needed. (Patient not taking: Reported on 03/05/2019), Disp: 30 tablet, Rfl: 0 .  traZODone (DESYREL) 50 MG tablet, Take 1-2 tablets (50-100 mg total) by mouth at bedtime as needed for sleep., Disp: 60 tablet, Rfl: 1 Medication Side Effects: none  Family Medical/ Social History: Changes? Yes she is staying with a friend right now for a few weeks.  MENTAL HEALTH EXAM:  There were no vitals taken for this visit.There is no height or weight on file to calculate BMI.  General Appearance: Unable to assess  Eye Contact:  Unable to assess  Speech:  Clear and Coherent and Sounds tired  Volume:  Normal  Mood:  Depressed  Affect:  Unable to assess  Thought Process:  Goal Directed  Orientation:  Full (Time, Place, and Person)  Thought Content: Logical   Suicidal Thoughts:  No  Homicidal Thoughts:  No  Memory:  WNL  Judgement:  Good  Insight:  Good  Psychomotor Activity:  Unable to assess  Concentration:  Concentration: Good  Recall:  Good  Fund of Knowledge: Good  Language: Good  Assets:  Desire for Improvement  ADL's:  Impaired  Cognition: WNL  Prognosis:  Good    DIAGNOSES:    ICD-10-CM   1. Bipolar disorder with severe depression ([place])  F31.4   2. Generalized anxiety disorder  F41.1   3. Insomnia due to other mental disorder  F51.05    F99     Receiving Psychotherapy: Yes With Lanetta Inch, [facility]   RECOMMENDATIONS:  Increase Risperdal to 2 mg nightly. Continue Xanax 0.5 mg 1 3  times daily as needed. Restart trazodone 50 mg, 1-2 nightly as needed sleep. Continue counseling with Lanetta Inch, [facility]. We discussed her return to work date.  She has been out since 04/12/2019 and I do not think she is ready to go back to work next week as we had previously discussed.  I am tentatively having her go back on 05/27/2019. Return in 6 weeks.  Donnal Moat, PA-C   This record has been created using [employer].  Chart creation errors have been sought, but may not always have been located and corrected. Such creation errors do not reflect on the standard of medical care.

## 2019-05-21 DIAGNOSIS — Z0289 Encounter for other administrative examinations: Secondary | ICD-10-CM

## 2019-06-03 ENCOUNTER — Telehealth: Payer: Self-pay | Admitting: *Deleted

## 2019-06-03 NOTE — Telephone Encounter (Signed)
Patient called, left message asking for tramadol for a tooth abscess.  RN returned the call, asked her to call back if she still needed our assistance, but directed her to contact her dentist and primary care physician.  Patient called last month for the same thing, was advised to contact her dentist at that time, too. Landis Gandy, RN

## 2019-06-04 ENCOUNTER — Other Ambulatory Visit: Payer: Self-pay

## 2019-06-04 ENCOUNTER — Encounter (HOSPITAL_COMMUNITY): Payer: Self-pay

## 2019-06-04 ENCOUNTER — Ambulatory Visit: Payer: BC Managed Care – PPO | Admitting: Mental Health

## 2019-06-04 ENCOUNTER — Telehealth: Payer: Self-pay

## 2019-06-04 ENCOUNTER — Emergency Department (HOSPITAL_COMMUNITY): Payer: BC Managed Care – PPO

## 2019-06-04 ENCOUNTER — Emergency Department (HOSPITAL_COMMUNITY)
Admission: EM | Admit: 2019-06-04 | Discharge: 2019-06-05 | Disposition: A | Discharge status: Home / Self Care | Payer: BC Managed Care – PPO | Attending: Emergency Medicine | Admitting: Emergency Medicine

## 2019-06-04 DIAGNOSIS — K047 Periapical abscess without sinus: Secondary | ICD-10-CM | POA: Diagnosis not present

## 2019-06-04 DIAGNOSIS — K0889 Other specified disorders of teeth and supporting structures: Secondary | ICD-10-CM | POA: Diagnosis present

## 2019-06-04 DIAGNOSIS — F17210 Nicotine dependence, cigarettes, uncomplicated: Secondary | ICD-10-CM | POA: Diagnosis not present

## 2019-06-04 DIAGNOSIS — L03211 Cellulitis of face: Secondary | ICD-10-CM

## 2019-06-04 DIAGNOSIS — Z21 Asymptomatic human immunodeficiency virus [HIV] infection status: Secondary | ICD-10-CM | POA: Diagnosis not present

## 2019-06-04 DIAGNOSIS — Z79899 Other long term (current) drug therapy: Secondary | ICD-10-CM | POA: Diagnosis not present

## 2019-06-04 DIAGNOSIS — R221 Localized swelling, mass and lump, neck: Secondary | ICD-10-CM | POA: Diagnosis not present

## 2019-06-04 LAB — I-STAT BETA HCG BLOOD, ED (MC, WL, AP ONLY): I-stat hCG, quantitative: <5 m[IU]/mL (ref <5)

## 2019-06-04 LAB — CBC WITH DIFFERENTIAL/PLATELET
Abs Immature Granulocytes: 0.02 10*3/uL (ref 0.00–0.07)
Basophils Absolute: 0 10*3/uL (ref 0.0–0.1)
Basophils Relative: 0 %
Eosinophils Absolute: 0 10*3/uL (ref 0.0–0.5)
Eosinophils Relative: 0 %
HCT: 35.3 % — ABNORMAL LOW (ref 36.0–46.0)
Hemoglobin: 12 g/dL (ref 12.0–15.0)
Immature Granulocytes: 0 %
Lymphocytes Relative: 31 %
Lymphs Abs: 3 10*3/uL (ref 0.7–4.0)
MCH: 31.4 pg (ref 26.0–34.0)
MCHC: 34 g/dL (ref 30.0–36.0)
MCV: 92.4 fL (ref 80.0–100.0)
Monocytes Absolute: 0.5 10*3/uL (ref 0.1–1.0)
Monocytes Relative: 5 %
Neutro Abs: 6 10*3/uL (ref 1.7–7.7)
Neutrophils Relative %: 64 %
Platelets: 220 10*3/uL (ref 150–400)
RBC: 3.82 MIL/uL — ABNORMAL LOW (ref 3.87–5.11)
RDW: 12.4 % (ref 11.5–15.5)
WBC: 9.5 10*3/uL (ref 4.0–10.5)
nRBC: 0 % (ref 0.0–0.2)

## 2019-06-04 LAB — BASIC METABOLIC PANEL
Anion gap: 9 (ref 5–15)
BUN: 8 mg/dL (ref 6–20)
CO2: 22 mmol/L (ref 22–32)
Calcium: 8.5 mg/dL — ABNORMAL LOW (ref 8.9–10.3)
Chloride: 104 mmol/L (ref 98–111)
Creatinine, Ser: 0.67 mg/dL (ref 0.44–1.00)
GFR calc Af Amer: >60 mL/min (ref >60)
GFR calc non Af Amer: >60 mL/min (ref >60)
Glucose, Bld: 94 mg/dL (ref 70–99)
Potassium: 3.7 mmol/L (ref 3.5–5.1)
Sodium: 135 mmol/L (ref 135–145)

## 2019-06-04 MED ORDER — ONDANSETRON HCL 4 MG/2ML IJ SOLN
4.0000 mg | Freq: Once | INTRAMUSCULAR | Status: AC
  Administered 2019-06-04: 23:00:00 4 mg via INTRAVENOUS
  Filled 2019-06-04: qty 2

## 2019-06-04 MED ORDER — ONDANSETRON HCL 4 MG/2ML IJ SOLN
4.0000 mg | Freq: Once | INTRAMUSCULAR | Status: AC
  Administered 2019-06-04: 4 mg via INTRAVENOUS
  Filled 2019-06-04: qty 2

## 2019-06-04 MED ORDER — SODIUM CHLORIDE 0.9 % IV BOLUS
1000.0000 mL | Freq: Once | INTRAVENOUS | Status: AC
  Administered 2019-06-04: 20:00:00 1000 mL via INTRAVENOUS

## 2019-06-04 MED ORDER — CLINDAMYCIN PHOSPHATE 600 MG/50ML IV SOLN
600.0000 mg | Freq: Once | INTRAVENOUS | Status: AC
  Administered 2019-06-04: 23:00:00 600 mg via INTRAVENOUS
  Filled 2019-06-04: qty 50

## 2019-06-04 MED ORDER — IBUPROFEN 800 MG PO TABS: 800.0000 mg | ORAL_TABLET | Freq: Three times a day (TID) | ORAL | 0 refills | Status: DC | PRN

## 2019-06-04 MED ORDER — MORPHINE SULFATE (PF) 4 MG/ML IV SOLN
4.0000 mg | Freq: Once | INTRAVENOUS | Status: AC
  Administered 2019-06-04: 23:00:00 4 mg via INTRAVENOUS
  Filled 2019-06-04: qty 1

## 2019-06-04 MED ORDER — MORPHINE SULFATE (PF) 4 MG/ML IV SOLN
4.0000 mg | Freq: Once | INTRAVENOUS | Status: AC
  Administered 2019-06-04: 4 mg via INTRAVENOUS
  Filled 2019-06-04: qty 1

## 2019-06-04 MED ORDER — IOHEXOL 300 MG/ML  SOLN
75.0000 mL | Freq: Once | INTRAMUSCULAR | Status: AC | PRN
  Administered 2019-06-04: 21:00:00 75 mL via INTRAVENOUS

## 2019-06-04 NOTE — ED Triage Notes (Signed)
Pt sent from [facility]. Pt sent for periapical abscess without sinus. Along with cellulitis of face. Pt referred for abscess with trismus, soft tissue swelling into right lateral neck, difficulty swallowing saliva. Pt wants abx, pt is afraid to drain. Pt states that she had the abscess x 1 month. Pt states that it got better for a while, but came back. Pt states heating pad and tylenol helped.  Pt is well appearing, but O2 sats low. Placed on 2L [place].

## 2019-06-04 NOTE — ED Notes (Signed)
Pt also adds that she has had nausea and vomiting, though not today.

## 2019-06-04 NOTE — Telephone Encounter (Signed)
Thank you Luiz Ochoa

## 2019-06-04 NOTE — Telephone Encounter (Signed)
Called patient to confirm pharmacy before sending prescription. Patient states [facility] is using [facility] [address].  Patient would also like to inform Dr. Johnnye Sima that she has been nauseas and vomiting since this afternoon. She would like to know if this could be related to her tooth abscess. Advised patient to go to urgent care to be evaluated since our office does not have any open appointments today. [place]

## 2019-06-04 NOTE — ED Notes (Signed)
DISCUSSED PLAN OF CARE. REQUESTING UPDATE. GIVEN. PT UP AMBULATING TRIAGE AREA WITHOUT DIFFICULTY.

## 2019-06-04 NOTE — Telephone Encounter (Signed)
Patient called office today complaining about tooth abcess pain. Patient states that it is hard to swallow and that under her chin is swollen. Patient is currently scheduled to see Dental clinic on 9/29,but would like to know if MD would be able to provide pain medicine to get her through until her appointment. Patient has tried tylenol and ibuprofen, but states this does not help. Will route message to MD. Aundria Rud, [place]

## 2019-06-04 NOTE — Telephone Encounter (Signed)
Motrin 429m q8h prn with food

## 2019-06-04 NOTE — ED Notes (Signed)
Pt given heat pad

## 2019-06-04 NOTE — Telephone Encounter (Signed)
Agree, Thanks!

## 2019-06-04 NOTE — ED Provider Notes (Signed)
[facility] DEPT Provider Note   CSN: [id number] Arrival date & time: 06/04/19  1725     History   Chief Complaint Chief Complaint  Patient presents with   Abscess    HPI Laura Rogers is a 43 y.o. female with past medical history of HIV presents emergency department today with chief complaint of dental pain and swelling.  Patient states 1 of her bottom teeth broke x1 month ago and she has had minimal pain since then.  She does state that pain has progressively worsened in the last 5 days.  She describes it as constant and stabbing.  She rates the pain 8 out of 10 in severity.  She has been taking Tylenol with transient symptom relief.  She has a dentist appointment scheduled next week.  She went to urgent care prior to arrival and was sent here for concern of periapical abscess with facial cellulitis.  She is also reporting associated fever and nausea with emesis.  T-max was 100.8 x 2 days ago.  She has been unable to tolerate p.o. intake secondary to pain.   Denies chills, voice change, inability to control secretions, nausea/vomiting,  dysphagia, odynophagia, drainage or trauma   Chart review shows CD4 count 325 x3 months ago, viral load undetectable. She is followed by ID Dr. Johnnye Sima.   Past Medical History:  Diagnosis Date   HIV infection ([place])     Patient Active Problem List   Diagnosis Date Noted   Nausea 03/05/2019   Insomnia 12/14/2018   Seasonal allergies 12/14/2018   Bipolar affective ([place]) 07/20/2017   Migraine without aura and without status migrainosus, not intractable 06/03/2015   CIN I (cervical intraepithelial neoplasia I) 05/04/2015   Migraine 04/06/2015   Herpes genitalia 04/09/2014   Other and unspecified hyperlipidemia 12/05/2012   Constipation 04/11/2011   HIV (human immunodeficiency virus infection) ([place]) 04/06/2011    Past Surgical History:  Procedure Laterality Date   NO PAST SURGERIES       OB  History    Gravida  1   Para  0   Term  0   Preterm  0   AB  1   Living  0     SAB  0   TAB  0   Ectopic  1   Multiple  0   Live Births               Home Medications    Prior to Admission medications   Medication Sig Start Date End Date Taking? Authorizing Provider  ALPRAZolam Duanne Moron) 0.5 MG tablet Take 1 tablet (0.5 mg total) by mouth 3 (three) times daily as needed for anxiety. 04/24/19  Yes Addison Lank, PA-C  emtricitabine-rilpivir-tenofovir AF (ODEFSEY) 200-25-25 MG TABS tablet Take 1 tablet by mouth daily with breakfast. Try to take at the same time each day with a full meal 12/14/18  Yes Dixon, Melton Krebs, NP  ondansetron (ZOFRAN) 4 MG tablet TAKE 1 TABLET(4 MG) BY MOUTH EVERY 8 HOURS AS NEEDED FOR NAUSEA OR VOMITING Patient taking differently: Take 4 mg by mouth every 8 (eight) hours as needed for nausea or vomiting.  10/14/15  Yes Campbell Riches, MD  risperiDONE (RISPERDAL) 2 MG tablet Take 1 tablet (2 mg total) by mouth at bedtime. 05/17/19  Yes Hurst, Helene Kelp T, PA-C  traMADol (ULTRAM) 50 MG tablet Take 1 tablet (50 mg total) by mouth every 8 (eight) hours as needed. 10/14/15  Yes Campbell Riches, MD  traZODone (DESYREL) 50 MG tablet Take 1-2 tablets (50-100 mg total) by mouth at bedtime as needed for sleep. 05/17/19  Yes Donnal Moat T, PA-C  acyclovir ointment (ZOVIRAX) 5 % Apply 1 application topically every 3 (three) hours. Patient not taking: Reported on 04/24/2019 09/26/18   Campbell Riches, MD  clindamycin (CLEOCIN) 75 MG/5ML solution Take 20 mLs (300 mg total) by mouth 3 (three) times daily for 5 days. 06/05/19 06/10/19  Jacklyn Branan E, PA-C  ibuprofen (ADVIL) 800 MG tablet Take 1 tablet (800 mg total) by mouth every 8 (eight) hours as needed for moderate pain. 06/04/19   Campbell Riches, MD  naproxen (NAPROSYN) 375 MG tablet Take 1 tablet (375 mg total) by mouth 2 (two) times daily as needed. 06/05/19   Daina Cara E, PA-C  ondansetron  (ZOFRAN) 4 MG tablet Take 1 tablet (4 mg total) by mouth every 8 (eight) hours as needed for nausea or vomiting. Patient not taking: Reported on 05/17/2019 03/05/19   Campbell Riches, MD  psyllium (METAMUCIL SMOOTH TEXTURE) 28 % packet Take 1 packet by mouth daily. Patient not taking: Reported on 06/04/2019 11/20/17   Campbell Riches, MD  temazepam (RESTORIL) 15 MG capsule Take 1 capsule (15 mg total) by mouth at bedtime as needed for sleep. Patient not taking: Reported on 06/04/2019 11/04/15   Campbell Riches, MD    Family History Family History  Problem Relation Age of Onset   Hyperlipidemia Mother    Diabetes Father    Asthma Brother    Cancer Maternal Grandmother     Social History Social History   Tobacco Use   Smoking status: Current Every Day Smoker    Packs/day: 0.10    Types: Cigarettes    Start date: 09/12/2012   Smokeless tobacco: Never Used  Substance Use Topics   Alcohol use: Not Currently    Alcohol/week: 14.0 standard drinks    Types: 14 Shots of liquor per week   Drug use: Yes    Frequency: 7.0 times per week    Types: Marijuana     Allergies   Levofloxacin, Valacyclovir hcl, Sulfamethoxazole-trimethoprim, Levofloxacin, Penicillins, and Bactrim   Review of Systems Review of Systems  Constitutional: Negative for chills and fever.  HENT: Positive for dental problem and facial swelling. Negative for congestion, drooling, ear discharge, ear pain, sinus pressure, sinus pain, sore throat, trouble swallowing and voice change.   Eyes: Negative for pain and redness.  Respiratory: Negative for cough and shortness of breath.   Cardiovascular: Negative for chest pain.  Gastrointestinal: Negative for abdominal pain, constipation, diarrhea, nausea and vomiting.  Genitourinary: Negative for dysuria and hematuria.  Musculoskeletal: Negative for back pain and neck pain.  Skin: Negative for wound.  Allergic/Immunologic: Positive for immunocompromised state.    Neurological: Negative for weakness, numbness and headaches.     Physical Exam Updated Vital Signs BP 133/88    Pulse (!) 112    Temp 98.8 F (37.1 C) (Oral)    Resp 16    Wt 62.6 kg    LMP 05/28/2019    SpO2 93%    BMI 26.07 kg/m   Physical Exam Vitals signs and nursing note reviewed.  Constitutional:      General: She is not in acute distress.    Appearance: She is not ill-appearing.     Comments: Airway is intact. She is speaking in full sentences  HENT:     Head: Normocephalic and atraumatic.     Right  Ear: Tympanic membrane and external ear normal.     Left Ear: Tympanic membrane and external ear normal.     Nose: Nose normal.     Mouth/Throat:     Mouth: Mucous membranes are moist.     Pharynx: Oropharynx is clear.   Eyes:     General: No scleral icterus.       Right eye: No discharge.        Left eye: No discharge.     Extraocular Movements: Extraocular movements intact.     Conjunctiva/sclera: Conjunctivae normal.     Pupils: Pupils are equal, round, and reactive to light.  Neck:     Musculoskeletal: Normal range of motion.     Vascular: No JVD.  Cardiovascular:     Rate and Rhythm: Regular rhythm. Tachycardia present.     Pulses: Normal pulses.          Radial pulses are 2+ on the right side and 2+ on the left side.     Heart sounds: Normal heart sounds.  Pulmonary:     Comments: Lungs clear to auscultation in all fields. Symmetric chest rise. No wheezing, rales, or rhonchi. Abdominal:     Comments: Abdomen is soft, non-distended, and non-tender in all quadrants. No rigidity, no guarding. No peritoneal signs.  Musculoskeletal: Normal range of motion.  Skin:    General: Skin is warm and dry.     Capillary Refill: Capillary refill takes less than 2 seconds.  Neurological:     Mental Status: She is oriented to person, place, and time.     GCS: GCS eye subscore is 4. GCS verbal subscore is 5. GCS motor subscore is 6.     Comments: Fluent speech, no facial  droop.  Psychiatric:        Behavior: Behavior normal.      ED Treatments / Results  Labs (all labs ordered are listed, but only abnormal results are displayed) Labs Reviewed  CBC WITH DIFFERENTIAL/PLATELET - Abnormal; Notable for the following components:      Result Value   RBC 3.82 (*)    HCT 35.3 (*)    All other components within normal limits  BASIC METABOLIC PANEL - Abnormal; Notable for the following components:   Calcium 8.5 (*)    All other components within normal limits  I-STAT BETA HCG BLOOD, ED ([facility], [facility], [facility] ONLY)    EKG None  Radiology Ct Soft Tissue Neck W Contrast  Result Date: 06/04/2019 CLINICAL DATA:  Initial evaluation for acute right neck swelling, difficulty swallowing. Fever. EXAM: CT NECK WITH CONTRAST TECHNIQUE: Multidetector CT imaging of the neck was performed using the standard protocol following the bolus administration of intravenous contrast. CONTRAST:  51m OMNIPAQUE IOHEXOL 300 MG/ML  SOLN COMPARISON:  None available. FINDINGS: Pharynx and larynx: Oral cavity within normal limits without discrete mass or loculated fluid collection. No acute inflammatory changes seen about the dentition. [place] tonsils fairly symmetric without acute inflammatory changes. There is subtle asymmetric hazy inflammatory stranding within the adjacent right parapharyngeal fat, suspicious for possible infection given the provided history. Similarly, subtle hazy stranding seen surrounding the right mandibular body (series 5, image 39). There are a few dental caries noted within the adjacent right mandibular molars, which could reflect the source of infection. No obvious pharyngeal or mucosal edema to suggest acute pharyngitis. No discrete abscess or drainable fluid collection. No retropharyngeal collection. Epiglottis within normal limits. Vallecula largely effaced by the lingual tonsils. Remainder of the oropharynx and  nasopharynx within normal limits. Remainder of the hypopharynx  and supraglottic larynx within normal limits. True cords symmetric and normal. Subglottic airway clear. Salivary glands: Salivary glands including the parotid and submandibular glands within normal limits. Thyroid: Normal. Lymph nodes: Mildly prominent right level IB nodes measure up to 1 cm in short axis. Mildly enlarged right level II node measures up to 13 mm. Findings are nonspecific, but could be reactive. No other pathologically enlarged lymph nodes identified within the neck. Vascular: Normal intravascular enhancement seen throughout the neck. Limited intracranial: Unremarkable. Visualized orbits: Unremarkable. Mastoids and visualized paranasal sinuses: Visualized paranasal sinuses are clear. Visualize mastoids and middle ear cavities clear and free of fluid. Skeleton: No acute osseous finding. No discrete lytic or blastic osseous lesions. Upper chest: Visualized upper chest demonstrates no acute finding. No made of a mildly prominent left subpectoral node measuring up to the upper limits of normal at 1 cm in short axis. Other: None. IMPRESSION: 1. Mild asymmetric hazy inflammatory stranding about the right mandibular body and adjacent right parapharyngeal space, concerning for acute infection/cellulitis given provided history. There are a few scattered dental caries within the adjacent right mandibular molars, which could reflect the source of infection. No discrete abscess or drainable fluid collection. 2. Mildly prominent right-sided cervical adenopathy as above, nonspecific, but could be reactive. 3. No other acute abnormality identified within the neck. Electronically Signed   By: Jeannine Boga M.D.   On: 06/04/2019 21:42    Procedures Procedures (including critical care time)  Medications Ordered in ED Medications  morphine 4 MG/ML injection 4 mg (4 mg Intravenous Given 06/04/19 2026)  ondansetron (ZOFRAN) injection 4 mg (4 mg Intravenous Given 06/04/19 2026)  sodium chloride 0.9 % bolus  1,000 mL (0 mLs Intravenous Stopped 06/04/19 2304)  iohexol (OMNIPAQUE) 300 MG/ML solution 75 mL (75 mLs Intravenous Contrast Given 06/04/19 2118)  morphine 4 MG/ML injection 4 mg (4 mg Intravenous Given 06/04/19 2322)  ondansetron (ZOFRAN) injection 4 mg (4 mg Intravenous Given 06/04/19 2322)  clindamycin (CLEOCIN) IVPB 600 mg (0 mg Intravenous Stopped 06/05/19 0012)  ketorolac (TORADOL) 30 MG/ML injection 30 mg (30 mg Intravenous Given 06/05/19 0008)  dexamethasone (DECADRON) injection 10 mg (10 mg Intravenous Given 06/05/19 0008)     Initial Impression / Assessment and Plan / ED Course  I have reviewed the triage vital signs and the nursing notes.  Pertinent labs & imaging results that were available during my care of the patient were reviewed by me and considered in my medical decision making (see chart for details).  Patient seen and examined. Patient nontoxic appearing, in no apparent distress. Triage vitals show tachycardia and hypxia. On my exam pt is very anxious appearing. Heart rate is in the 80s and SpO2 is 95% on room air. She has mild right side facial swelling and trismus on exam.  CBC without leukocytosis. BMP unremarkable, no severe electrolyte derangement. Pregnancy test is negative. CT soft tissue neck shows mild asymmetric hazy inflammatory stranding about the right mandibular body and adjacent right parapharyngeal space, concerning for acute infection. IV Clindamycin started. This case was discussed with Dr. Dutch Quint who agrees with plan to admit.   Spoke with Dr. Myna Hidalgo with hospitalist service who declines admission.  Pt reassessed after pain medication and IV antibiotics. She is tolerating PO intake. I ambulated her around the department, no hypoxia or distress noted. SpO2 95-97% on room air while ambulating. Engaged in shared decision making with patient, she is agreeable to discharge home on  antibiotics and naproxen. She has a dentist appointment scheduled in 1 week. I  recommend she attempt to schedule the appointment for sooner. Also recommend she follow up with Dr. Johnnye Sima in 1-2 days. She appears stable for discharge home. Strict return precautions discussed.     Portions of this note were generated with [employer]. Dictation errors may occur despite best attempts at proofreading.    Final Clinical Impressions(s) / ED Diagnoses   Final diagnoses:  Facial cellulitis  Dental infection    ED Discharge Orders         Ordered    clindamycin (CLEOCIN) 75 MG/5ML solution  3 times daily     06/05/19 0141    naproxen (NAPROSYN) 375 MG tablet  2 times daily PRN     06/05/19 0141           Cherre Robins, PA-C 06/05/19 1112    Julianne Rice, MD 06/05/19 2010

## 2019-06-05 ENCOUNTER — Other Ambulatory Visit: Payer: Self-pay

## 2019-06-05 DIAGNOSIS — B20 Human immunodeficiency virus [HIV] disease: Secondary | ICD-10-CM

## 2019-06-05 MED ORDER — KETOROLAC TROMETHAMINE 30 MG/ML IJ SOLN
30.0000 mg | Freq: Once | INTRAMUSCULAR | Status: AC
  Administered 2019-06-05: 30 mg via INTRAVENOUS
  Filled 2019-06-05: qty 1

## 2019-06-05 MED ORDER — NAPROXEN 375 MG PO TABS: 375.0000 mg | ORAL_TABLET | Freq: Two times a day (BID) | ORAL | 0 refills | Status: DC | PRN

## 2019-06-05 MED ORDER — DEXAMETHASONE SODIUM PHOSPHATE 10 MG/ML IJ SOLN
10.0000 mg | Freq: Once | INTRAMUSCULAR | Status: AC
  Administered 2019-06-05: 10 mg via INTRAVENOUS
  Filled 2019-06-05: qty 1

## 2019-06-05 MED ORDER — CLINDAMYCIN PALMITATE HCL 75 MG/5ML PO SOLR: 300.0000 mg | Freq: Three times a day (TID) | ORAL | 0 refills | Status: AC

## 2019-06-05 MED ORDER — EMTRICITAB-RILPIVIR-TENOFOV AF 200-25-25 MG PO TABS: 1.0000 | ORAL_TABLET | Freq: Every day | ORAL | 5 refills | Status: DC

## 2019-06-05 NOTE — Discharge Instructions (Addendum)
You have been seen today for dental infection. Please read and follow all provided instructions. Return to the emergency room for worsening condition or new concerning symptoms.  Watch for signs of infection including fever, worsening swelling, drooling, unable to open your mouth. Return immediately if those symptoms develop.  1. Medications:  Prescription sent to your pharmacy for clindamycin. This is a liquid antibiotic, please take as prescribed.  -Prescription also sent for Naproxen. Please take this for pain. Do not take motrin with this medicine as they are similar. You should eat with this medicine as it can upset your stomach.   Continue usual home medications Take medications as prescribed. Please review all of the medicines and only take them if you do not have an allergy to them.  2. Treatment: rest, drink plenty of fluids 3. Follow Up: Please follow up with Dr. Johnnye Sima in 1-2 days. Call office tomorrow to schedule appointment to be seen in office.  It is also a possibility that you have an allergic reaction to any of the medicines that you have been prescribed - Everybody reacts differently to medications and while MOST people have no trouble with most medicines, you may have a reaction such as nausea, vomiting, rash, swelling, shortness of breath. If this is the case, please stop taking the medicine immediately and contact your physician.  ?

## 2019-06-05 NOTE — ED Notes (Signed)
Pt was verbalized discharge instructions. Pt had no further questions at this time. NAD.

## 2019-06-05 NOTE — ED Notes (Addendum)
Pt ambulated to the restroom with no difficulty

## 2019-06-05 NOTE — Telephone Encounter (Signed)
Request for refill via fax from pharmacy. Refill approved.  Eugenia Mcalpine

## 2019-06-05 NOTE — ED Notes (Signed)
Pt states she cannot walk at this time due to pain. Pain medication has been administered. Will continue to monitor.

## 2019-06-18 ENCOUNTER — Other Ambulatory Visit: Payer: Self-pay

## 2019-06-18 ENCOUNTER — Ambulatory Visit (INDEPENDENT_AMBULATORY_CARE_PROVIDER_SITE_OTHER): Payer: BC Managed Care – PPO | Admitting: Mental Health

## 2019-06-18 DIAGNOSIS — F314 Bipolar disorder, current episode depressed, severe, without psychotic features: Secondary | ICD-10-CM

## 2019-06-18 NOTE — Progress Notes (Signed)
[facility] Psychotherapy Note  Name: NEFERTARI REBMAN Date: 06/18/2019 MRN: [id number] DOB: 1976-07-30 PCP: Campbell Riches, MD  Time spent: 45 minutes  Treatment: individual therapy  Mental Status Exam:   Appearance:   Casual     Behavior:  Appropriate  Motor:  Normal  Speech/Language:   Clear and Coherent  Affect:  Full Range  Mood:  anxious and depressed  Thought process:  normal  Thought content:    WNL  Sensory/Perceptual disturbances:    WNL  Orientation:  x4  Attention:  Good  Concentration:  Good  Memory:  WNL  Fund of knowledge:   Good  Insight:    Good  Judgment:   Good  Impulse Control:  Good   Reported Symptoms:    Depressed mood, anxiety, crying spells, isolative, hopeless feelings, passive SI  Risk Assessment: Danger to Self:  passive SI, no plan/intent Self-injurious Behavior: No Danger to Others: No Duty to Warn:no Physical Aggression / Violence:No  Access to Firearms a concern: No  Gang Involvement:No  Patient / guardian was educated about steps to take if suicide or homicide risk level increases between visits: yes While future psychiatric events cannot be accurately predicted, the patient does not currently require acute inpatient psychiatric care and does not currently meet [facility] involuntary commitment criteria.  Subjective:   Pt  presents for session one time, and no distress. She shared progress in recent changes. She shared how she and her boyfriend of several years have been able to have more meaningful discussions. she shared how she has had an increased functioning day today evidence by working full time and taking steps to be more assertive in communication when needed. She shared how she has taken steps in this area in her relationship with her boyfriend as well as with her mother. Provide support that she process feelings related to you coping with her medical issues as well as her mental health diagnosis, specifically with  communicating with her boyfriend to help him understand more about her diagnosis. It was identified through today's session that patient will continue to identify and express her feelings openly with her boyfriend to improve the relationship. Patient presented as helpful, engaging with the focus on continuing to make changes in the areas of her life in which she feels she can affect.  Interventions: CBT, Problem solving  Diagnoses:    ICD-10-CM   1. Bipolar disorder with severe depression ([place])  F31.4      Plan:  1.  Patient to continue to engage in individual counseling 2-4 times a month or as needed. 2.  Patient to identify and apply CBT, coping skills learned in session to decrease depression and anxiety symptoms. 3.  Patient to improve assertiveness in setting boundaries as needed in relationships evidenced by increasing insight toward self-care and improved communication skills in her relationships. 4.  Patient to contact this office, go to the local ED or call 911 if a crisis or emergency develops between visits.   [place], [facility]

## 2019-07-04 ENCOUNTER — Ambulatory Visit: Payer: BC Managed Care – PPO | Admitting: Mental Health

## 2019-07-12 ENCOUNTER — Ambulatory Visit: Payer: BC Managed Care – PPO | Admitting: Physician Assistant

## 2019-09-29 ENCOUNTER — Other Ambulatory Visit: Payer: Self-pay | Admitting: Physician Assistant

## 2019-10-07 ENCOUNTER — Ambulatory Visit (INDEPENDENT_AMBULATORY_CARE_PROVIDER_SITE_OTHER): Payer: BC Managed Care – PPO | Admitting: Physician Assistant

## 2019-10-07 ENCOUNTER — Ambulatory Visit: Payer: BC Managed Care – PPO | Admitting: Physician Assistant

## 2019-10-07 ENCOUNTER — Encounter: Payer: Self-pay | Admitting: Physician Assistant

## 2019-10-07 ENCOUNTER — Other Ambulatory Visit: Payer: Self-pay

## 2019-10-07 DIAGNOSIS — F314 Bipolar disorder, current episode depressed, severe, without psychotic features: Secondary | ICD-10-CM | POA: Diagnosis not present

## 2019-10-07 DIAGNOSIS — F5105 Insomnia due to other mental disorder: Secondary | ICD-10-CM

## 2019-10-07 DIAGNOSIS — F99 Mental disorder, not otherwise specified: Secondary | ICD-10-CM

## 2019-10-07 DIAGNOSIS — F411 Generalized anxiety disorder: Secondary | ICD-10-CM | POA: Diagnosis not present

## 2019-10-07 DIAGNOSIS — B20 Human immunodeficiency virus [HIV] disease: Secondary | ICD-10-CM

## 2019-10-07 MED ORDER — ALPRAZOLAM 0.5 MG PO TABS: 0.5000 mg | ORAL_TABLET | Freq: Three times a day (TID) | ORAL | 2 refills | Status: DC | PRN

## 2019-10-07 MED ORDER — TEMAZEPAM 15 MG PO CAPS: 15.0000 mg | ORAL_CAPSULE | Freq: Every evening | ORAL | 0 refills | Status: DC | PRN

## 2019-10-07 MED ORDER — RISPERIDONE 2 MG PO TABS: 2.0000 mg | ORAL_TABLET | Freq: Every day | ORAL | 2 refills | Status: DC

## 2019-10-07 NOTE — Progress Notes (Signed)
[facility]  Patient ID: Laura Rogers,  MRN: [id number]  PCP: Campbell Riches, MD  Date of Evaluation: 10/07/2019 Time spent:20 minutes  Chief Complaint:  Chief Complaint    Anxiety; Insomnia      HISTORY/CURRENT STATUS: HPI For routine med check.   Has been under a lot of stress. Is working from home and has been since May. HIV is stable, but she's scared she'll be exposed to covid so she rarely goes out. Has needed the Xanax more often lately due to a death in her family as well as the death of a Mudlogger. It still helps. "I try not to take them too much."  Sleeps good most of the time. Doesn't need the Restoril often but it helps when she needs it.  Usually the Trazodone is enough.   Patient denies loss of interest in usual activities and is able to enjoy things.  Denies decreased energy or motivation.  Appetite has not changed.  No extreme sadness, tearfulness, or feelings of hopelessness.  Denies any changes in concentration, making decisions or remembering things.  Denies suicidal or homicidal thoughts.  Patient denies increased energy with decreased need for sleep, no increased talkativeness, no racing thoughts, no impulsivity or risky behaviors, no increased spending, no increased libido, no grandiosity.  Denies dizziness, syncope, seizures, numbness, tingling, tremor, tics, unsteady gait, slurred speech, confusion. Denies muscle or joint pain, stiffness, or dystonia.  Individual Medical History/ Review of Systems: Changes? :No    Past medications for mental health diagnoses include: Risperdal, Zoloft, Restoril, trazodone, Xanax  Allergies: Levofloxacin, Valacyclovir hcl, Sulfamethoxazole-trimethoprim, Levofloxacin, Penicillins, and Bactrim  Current Medications:  Current Outpatient Medications:  .  ALPRAZolam (XANAX) 0.5 MG tablet, Take 1 tablet (0.5 mg total) by mouth 3 (three) times daily as needed for anxiety., Disp: 90 tablet, Rfl: 2 .   emtricitabine-rilpivir-tenofovir AF (ODEFSEY) 200-25-25 MG TABS tablet, Take 1 tablet by mouth daily with breakfast. Try to take at the same time each day with a full meal, Disp: 30 tablet, Rfl: 5 .  ondansetron (ZOFRAN) 4 MG tablet, Take 1 tablet (4 mg total) by mouth every 8 (eight) hours as needed for nausea or vomiting., Disp: 20 tablet, Rfl: 0 .  risperiDONE (RISPERDAL) 2 MG tablet, Take 1 tablet (2 mg total) by mouth at bedtime., Disp: 30 tablet, Rfl: 2 .  temazepam (RESTORIL) 15 MG capsule, Take 1 capsule (15 mg total) by mouth at bedtime as needed for sleep., Disp: 15 capsule, Rfl: 0 .  traZODone (DESYREL) 50 MG tablet, Take 1-2 tablets (50-100 mg total) by mouth at bedtime as needed for sleep., Disp: 60 tablet, Rfl: 1 .  acyclovir ointment (ZOVIRAX) 5 %, Apply 1 application topically every 3 (three) hours. (Patient not taking: Reported on 04/24/2019), Disp: 15 g, Rfl: 0 .  ibuprofen (ADVIL) 800 MG tablet, Take 1 tablet (800 mg total) by mouth every 8 (eight) hours as needed for moderate pain. (Patient not taking: Reported on 10/07/2019), Disp: 30 tablet, Rfl: 0 .  naproxen (NAPROSYN) 375 MG tablet, Take 1 tablet (375 mg total) by mouth 2 (two) times daily as needed. (Patient not taking: Reported on 10/07/2019), Disp: 20 tablet, Rfl: 0 .  ondansetron (ZOFRAN) 4 MG tablet, TAKE 1 TABLET(4 MG) BY MOUTH EVERY 8 HOURS AS NEEDED FOR NAUSEA OR VOMITING (Patient not taking: Reported on 10/07/2019), Disp: 20 tablet, Rfl: 0 .  psyllium (METAMUCIL SMOOTH TEXTURE) 28 % packet, Take 1 packet by mouth daily. (Patient not taking: Reported  on 06/04/2019), Disp: 30 tablet, Rfl: 1 .  traMADol (ULTRAM) 50 MG tablet, Take 1 tablet (50 mg total) by mouth every 8 (eight) hours as needed. (Patient not taking: Reported on 10/07/2019), Disp: 30 tablet, Rfl: 0 Medication Side Effects: none  Family Medical/ Social History: Changes? Yes Her aunt died recently. Also a 'cube-mate' at work died from covid last week.   MENTAL  HEALTH EXAM:  There were no vitals taken for this visit.There is no height or weight on file to calculate BMI.  General Appearance: Casual, Neat and Well Groomed  Eye Contact:  Good  Speech:  Clear and Coherent  Volume:  Normal  Mood:  Euthymic  Affect:  Appropriate  Thought Process:  Goal Directed  Orientation:  Full (Time, Place, and Person)  Thought Content: Logical   Suicidal Thoughts:  No  Homicidal Thoughts:  No  Memory:  WNL  Judgement:  Good  Insight:  Good  Psychomotor Activity:  Normal  Concentration:  Concentration: Good and Attention Span: Good  Recall:  Good  Fund of Knowledge: Good  Language: Good  Assets:  Desire for Improvement  ADL's:  Intact  Cognition: WNL  Prognosis:  Good    DIAGNOSES:    ICD-10-CM   1. Bipolar disorder with severe depression ([place])  F31.4   2. Generalized anxiety disorder  F41.1   3. Insomnia due to other mental disorder  F51.05    F99   4. AIDS ([place])  B20 temazepam (RESTORIL) 15 MG capsule    Receiving Psychotherapy: No    RECOMMENDATIONS:  Overall, she is doing well.  We discussed a few ways to help fight the stress of the quarantine as well as the sadness with the loss of her aunt and a Mudlogger. PDMP was reviewed. Continue Xanax 0.5 mg, 1 3 times daily as needed. Continue Risperdal 2 mg nightly. Continue Restoril 15 mg, 1 nightly as needed. Continue trazodone 50 mg, 1-2 nightly as needed sleep.  She should try this first prior to using the Restoril. Sleep hygiene was discussed. Return in 3 months.  Donnal Moat, PA-C

## 2019-12-14 ENCOUNTER — Ambulatory Visit: Payer: Self-pay

## 2019-12-21 ENCOUNTER — Ambulatory Visit: Payer: Self-pay

## 2019-12-23 ENCOUNTER — Ambulatory Visit: Payer: Self-pay

## 2020-01-07 ENCOUNTER — Ambulatory Visit (INDEPENDENT_AMBULATORY_CARE_PROVIDER_SITE_OTHER): Payer: BC Managed Care – PPO | Admitting: Physician Assistant

## 2020-01-07 ENCOUNTER — Encounter: Payer: Self-pay | Admitting: Physician Assistant

## 2020-01-07 DIAGNOSIS — F99 Mental disorder, not otherwise specified: Secondary | ICD-10-CM

## 2020-01-07 DIAGNOSIS — F314 Bipolar disorder, current episode depressed, severe, without psychotic features: Secondary | ICD-10-CM | POA: Diagnosis not present

## 2020-01-07 DIAGNOSIS — F5105 Insomnia due to other mental disorder: Secondary | ICD-10-CM

## 2020-01-07 DIAGNOSIS — F411 Generalized anxiety disorder: Secondary | ICD-10-CM

## 2020-01-07 MED ORDER — ALPRAZOLAM 0.5 MG PO TABS: 0.5000 mg | ORAL_TABLET | Freq: Four times a day (QID) | ORAL | 1 refills | Status: DC | PRN

## 2020-01-07 MED ORDER — RISPERIDONE 3 MG PO TABS: 3.0000 mg | ORAL_TABLET | Freq: Every day | ORAL | 1 refills | Status: DC

## 2020-01-07 NOTE — Progress Notes (Signed)
[facility]  Patient ID: Laura Rogers,  MRN: [id number]  PCP: Campbell Riches, MD  Date of Evaluation: 01/07/2020 Time spent:30 minutes  Chief Complaint:  Chief Complaint    Anxiety; Insomnia; Depression     Virtual Visit via Telephone Note  I connected with patient by a video enabled telemedicine application or telephone, with their informed consent, and verified patient privacy and that I am speaking with the correct person using two identifiers.  I am private, in my office and the patient is home.   I discussed the limitations, risks, security and privacy concerns of performing an evaluation and management service by telephone and the availability of in person appointments. She is unable to get on the computer.  I also discussed with the patient that there may be a patient responsible charge related to this service. The patient expressed understanding and agreed to proceed.   I discussed the assessment and treatment plan with the patient. The patient was provided an opportunity to ask questions and all were answered. The patient agreed with the plan and demonstrated an understanding of the instructions.   The patient was advised to call back or seek an in-person evaluation if the symptoms worsen or if the condition fails to improve as anticipated.  I provided 30 minutes of non-face-to-face time during this encounter.  HISTORY/CURRENT STATUS: HPI For routine med check.  A lot of stress.  Has lost several family members.  A cousin died from covid.  Another cousin was shot and killed, and then a close friend died from falling on a train track and was killed.  "it's been a lot."  Feels like the Risperdal isn't working as well as it did. "I think I'm immune to it." Is overwhelmed. More anxious. Feels 'down' and energy is lower.  She does sleep pretty good using the Xanax to help her sleep.  She has been taking the Xanax up to 4 times a day recently because she has been  more anxious and for sleep.  Denies suicidal or homicidal thoughts.  She sees Dr.Hatcher, following positive HIV status.  Things are going okay and he recommends she get the COVID vaccine.  She has rescheduled 4 times because she is really nervous to have it done.  She has decided to get her blood counts soon and if they are okay then she will strongly consider getting the vaccine.  She scared to get it because she does not know what will happen in her body since she does have HIV.  Patient denies increased energy with decreased need for sleep, no increased talkativeness, no racing thoughts, no impulsivity or risky behaviors, no increased spending, no increased libido, no grandiosity, no increased irritability or anger, and no hallucinations.  Denies dizziness, syncope, seizures, numbness, tingling, tremor, tics, unsteady gait, slurred speech, confusion. Denies muscle or joint pain, stiffness, or dystonia. Denies unexplained weight loss, frequent infections, or sores that heal slowly.  No polyphagia, polydipsia, or polyuria. Denies visual changes or paresthesias.   Individual Medical History/ Review of Systems: Changes? :No    Past medications for mental health diagnoses include: Risperdal, Zoloft, Restoril, trazodone, Xanax  Allergies: Levofloxacin, Valacyclovir hcl, Sulfamethoxazole-trimethoprim, Levofloxacin, Penicillins, and Bactrim  Current Medications:  Current Outpatient Medications:  .  acyclovir ointment (ZOVIRAX) 5 %, Apply 1 application topically every 3 (three) hours., Disp: 15 g, Rfl: 0 .  ALPRAZolam (XANAX) 0.5 MG tablet, Take 1 tablet (0.5 mg total) by mouth 4 (four) times daily as needed for  anxiety., Disp: 120 tablet, Rfl: 1 .  emtricitabine-rilpivir-tenofovir AF (ODEFSEY) 200-25-25 MG TABS tablet, Take 1 tablet by mouth daily with breakfast. Try to take at the same time each day with a full meal, Disp: 30 tablet, Rfl: 5 .  naproxen (NAPROSYN) 375 MG tablet, Take 1 tablet (375 mg  total) by mouth 2 (two) times daily as needed., Disp: 20 tablet, Rfl: 0 .  ondansetron (ZOFRAN) 4 MG tablet, TAKE 1 TABLET(4 MG) BY MOUTH EVERY 8 HOURS AS NEEDED FOR NAUSEA OR VOMITING, Disp: 20 tablet, Rfl: 0 .  ondansetron (ZOFRAN) 4 MG tablet, Take 1 tablet (4 mg total) by mouth every 8 (eight) hours as needed for nausea or vomiting., Disp: 20 tablet, Rfl: 0 .  traZODone (DESYREL) 50 MG tablet, Take 1-2 tablets (50-100 mg total) by mouth at bedtime as needed for sleep., Disp: 60 tablet, Rfl: 1 .  ibuprofen (ADVIL) 800 MG tablet, Take 1 tablet (800 mg total) by mouth every 8 (eight) hours as needed for moderate pain. (Patient not taking: Reported on 01/07/2020), Disp: 30 tablet, Rfl: 0 .  psyllium (METAMUCIL SMOOTH TEXTURE) 28 % packet, Take 1 packet by mouth daily. (Patient not taking: Reported on 01/07/2020), Disp: 30 tablet, Rfl: 1 .  risperiDONE (RISPERDAL) 3 MG tablet, Take 1 tablet (3 mg total) by mouth at bedtime., Disp: 30 tablet, Rfl: 1 .  traMADol (ULTRAM) 50 MG tablet, Take 1 tablet (50 mg total) by mouth every 8 (eight) hours as needed. (Patient not taking: Reported on 10/07/2019), Disp: 30 tablet, Rfl: 0 Medication Side Effects: none  Family Medical/ Social History: Changes? No  MENTAL HEALTH EXAM:  There were no vitals taken for this visit.There is no height or weight on file to calculate BMI.  General Appearance: Unable to assess  Eye Contact:  Unable to assess  Speech:  Clear and Coherent and Normal Rate  Volume:  Normal  Mood:  Euthymic  Affect:  Unable to assess  Thought Process:  Goal Directed and Descriptions of Associations: Intact  Orientation:  Full (Time, Place, and Person)  Thought Content: Logical   Suicidal Thoughts:  No  Homicidal Thoughts:  No  Memory:  WNL  Judgement:  Good  Insight:  Good  Psychomotor Activity:  Unable to assess  Concentration:  Concentration: Good  Recall:  Good  Fund of Knowledge: Good  Language: Good  Assets:  Desire for Improvement   ADL's:  Intact  Cognition: WNL  Prognosis:  Good    DIAGNOSES:    ICD-10-CM   1. Bipolar disorder with severe depression ([place])  F31.4   2. Generalized anxiety disorder  F41.1   3. Insomnia due to other mental disorder  F51.05    F99     Receiving Psychotherapy: No    RECOMMENDATIONS:  PDMP was reviewed. I spent 30 minutes with her. We discussed different options to help with the anxiety.  Also for the change in mood, more depression.  It is partly situational but I think the Risperdal needs to be increased.  She agrees. Increase Risperdal to 3 mg, 1 p.o. nightly. Continue Xanax 0.5 mg, increased to 1 p.o. every 6 hours as needed (was 3 times daily as needed) Continue trazodone 50 mg, 1-2 nightly as needed sleep. Consider therapy. Return in 4 weeks.  Donnal Moat, PA-C

## 2020-01-30 ENCOUNTER — Ambulatory Visit: Payer: Self-pay

## 2020-02-01 ENCOUNTER — Ambulatory Visit: Payer: Self-pay

## 2020-03-31 IMAGING — CT CT NECK W/ CM
3 of 4 series · 12 of 33 positions shown, 14 images · IV contrast (omnipaque)
Comparison: None available.

CLINICAL DATA: Initial evaluation for acute right neck swelling,
difficulty swallowing. Fever.

EXAM:
CT NECK WITH CONTRAST
TECHNIQUE: Multidetector CT imaging of the neck was performed using the
standard protocol following the bolus administration of intravenous
contrast.
CONTRAST:  75mL OMNIPAQUE IOHEXOL 300 MG/ML  SOLN

[Series 2: axial neck · axial · 0.46mm/px · z∈[+740,+860]mm · 4 of 92 slices shown, 5 images]
[im 16/92  soft-tissue]
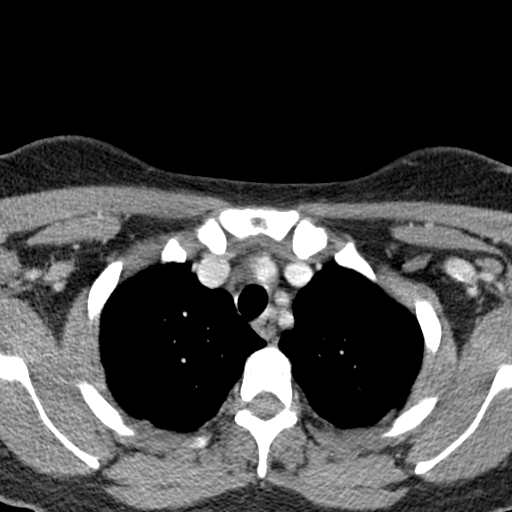
[im 16/92  bone]
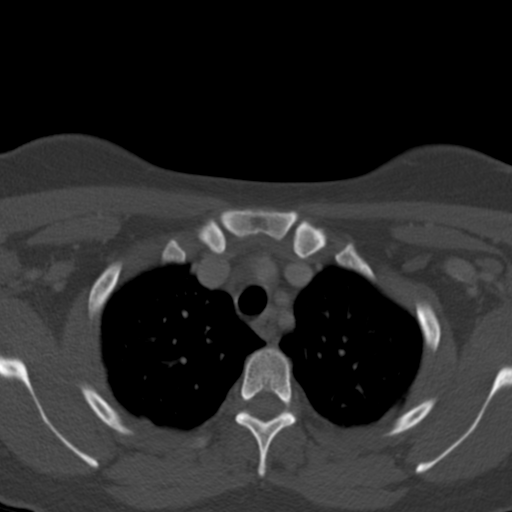
[im 31/92  bone]
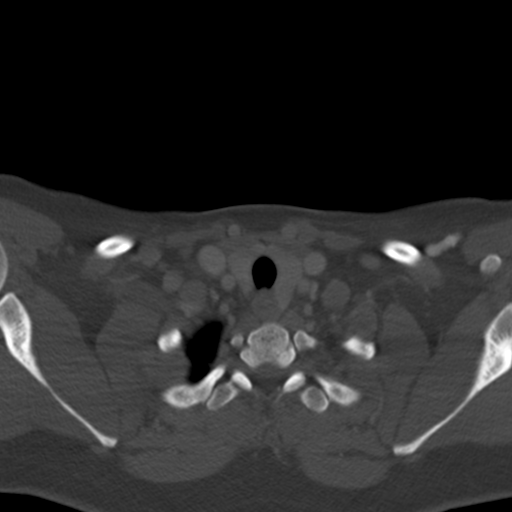
[im 61/92  bone]
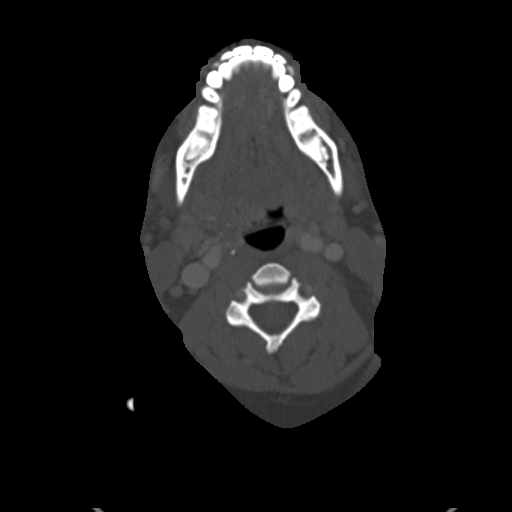
[im 76/92  bone]
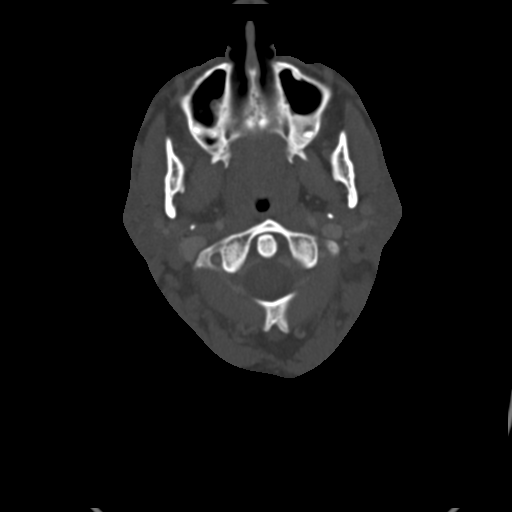

[Series 5: coronal · coronal · 0.39mm/px · 3 of 118 slices shown]
[im 24/118  bone]
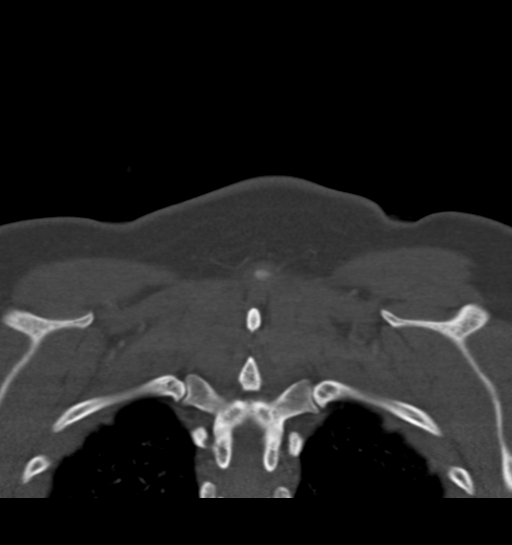
[im 47/118  bone]
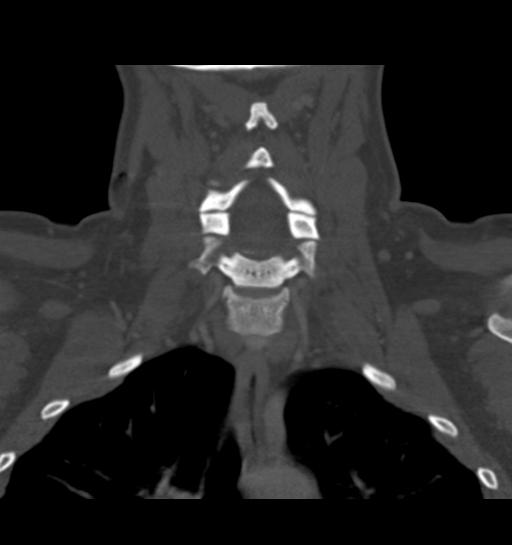
[im 71/118  bone]
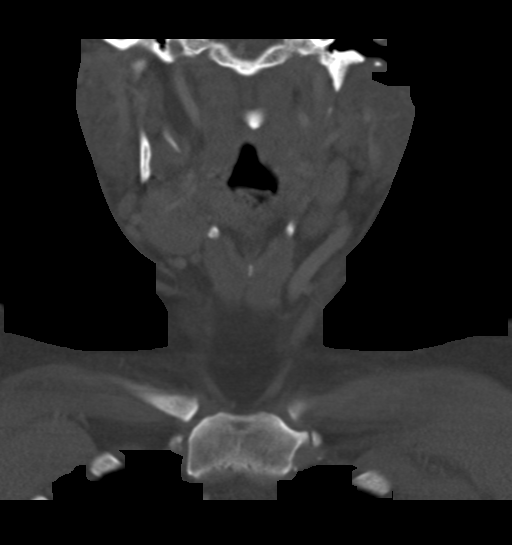

[Series 6: sagittal · sagittal · 0.41mm/px · 5 of 78 slices shown, 6 images]
[im 26/78  bone]
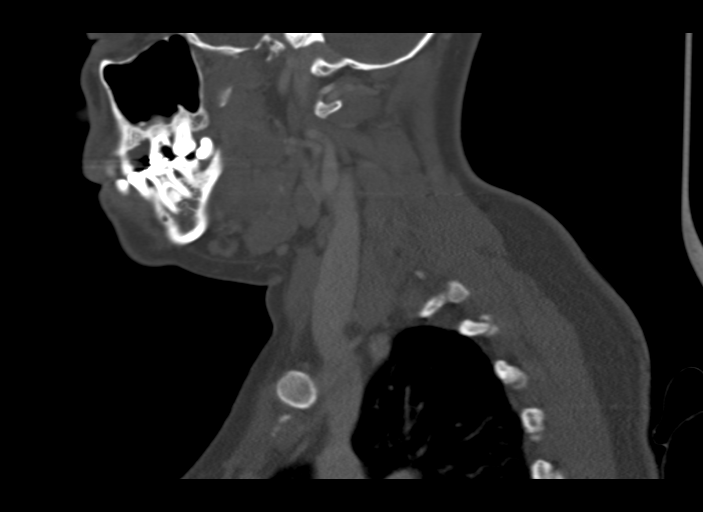
[im 33/78  bone]
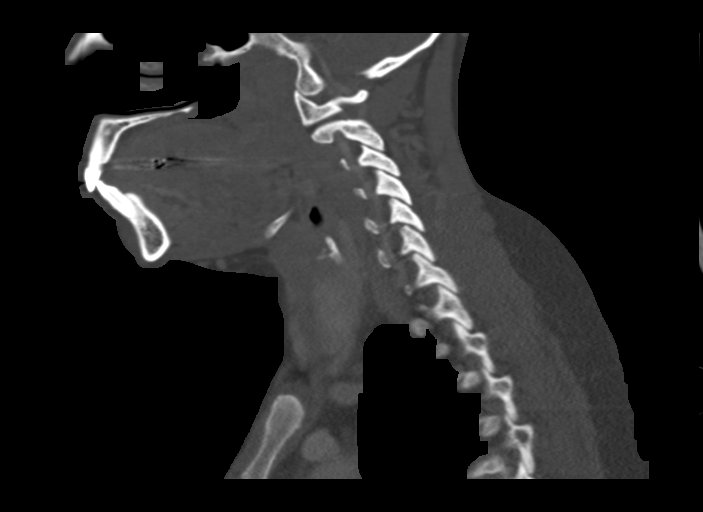
[im 39/78  soft-tissue]
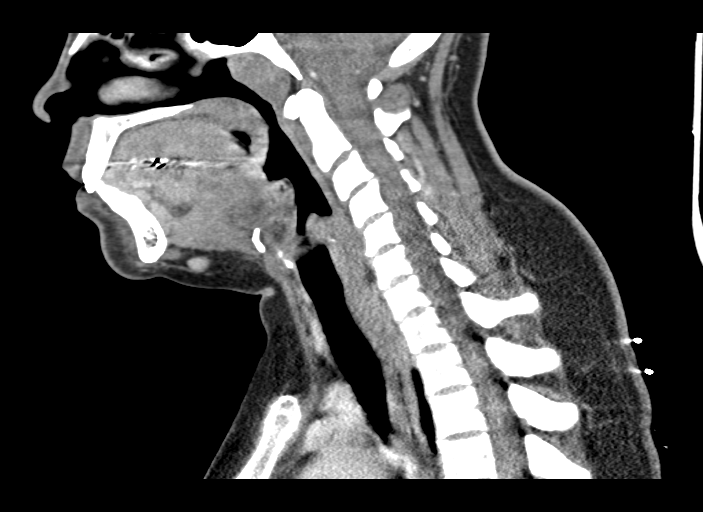
[im 39/78  bone]
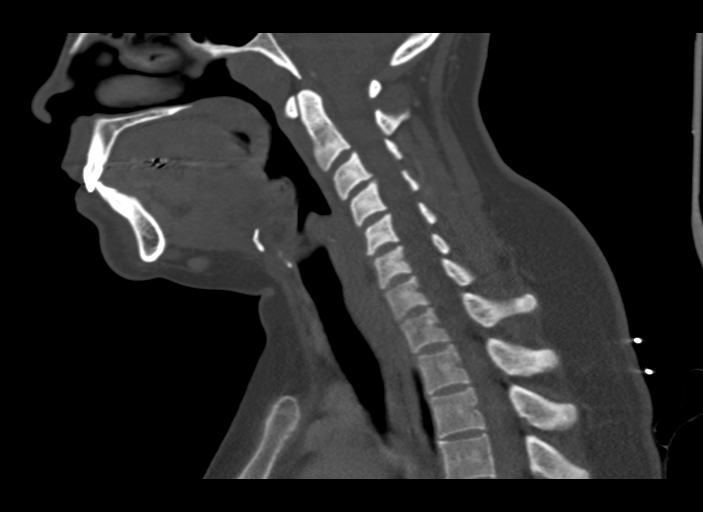
[im 45/78  bone]
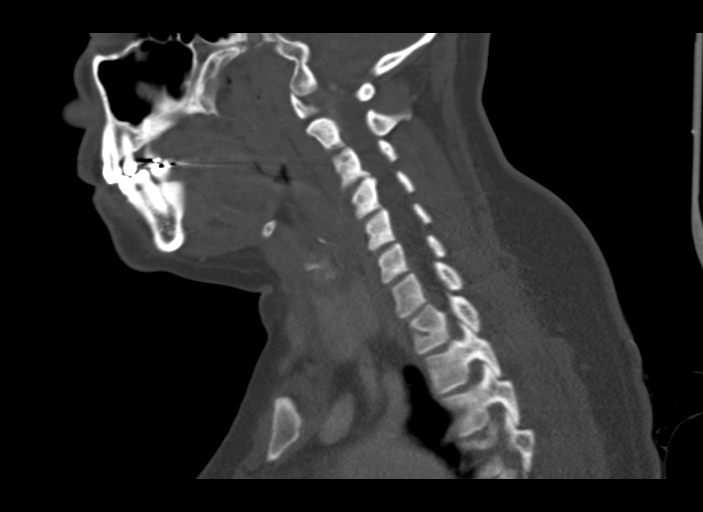
[im 52/78  bone]
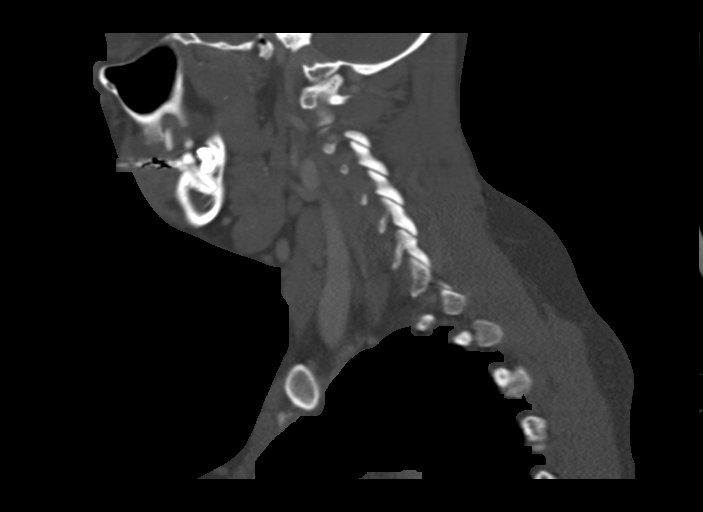

[12 of 33 positions shown; findings below may reference images not displayed]

FINDINGS: Pharynx and larynx: Oral cavity within normal limits without
discrete mass or loculated fluid collection. No acute inflammatory
changes seen about the dentition. Palatine tonsils fairly symmetric
without acute inflammatory changes. There is subtle asymmetric hazy
inflammatory stranding within the adjacent right parapharyngeal fat,
suspicious for possible infection given the provided history.
Similarly, subtle hazy stranding seen surrounding the right
mandibular body (series 5, image 39). There are a few dental caries
noted within the adjacent right mandibular molars, which could
reflect the source of infection. No obvious pharyngeal or mucosal
edema to suggest acute pharyngitis. No discrete abscess or drainable
fluid collection. No retropharyngeal collection. Epiglottis within
normal limits. Vallecula largely effaced by the lingual tonsils.
Remainder of the oropharynx and nasopharynx within normal limits.
Remainder of the hypopharynx and supraglottic larynx within normal
limits. True cords symmetric and normal. Subglottic airway clear.

Salivary glands: Salivary glands including the parotid and
submandibular glands within normal limits.

Thyroid: Normal.

Lymph nodes: Mildly prominent right level IB nodes measure up to 1
cm in short axis. Mildly enlarged right level II node measures up to
13 mm. Findings are nonspecific, but could be reactive. No other
pathologically enlarged lymph nodes identified within the neck.

Vascular: Normal intravascular enhancement seen throughout the neck.

Limited intracranial: Unremarkable.

Visualized orbits: Unremarkable.

Mastoids and visualized paranasal sinuses: Visualized paranasal
sinuses are clear. Visualize mastoids and middle ear cavities clear
and free of fluid.

Skeleton: No acute osseous finding. No discrete lytic or blastic
osseous lesions.

Upper chest: Visualized upper chest demonstrates no acute finding.
No made of a mildly prominent left subpectoral node measuring up to
the upper limits of normal at 1 cm in short axis.

Other: None.
IMPRESSION: 1. Mild asymmetric hazy inflammatory stranding about the right
mandibular body and adjacent right parapharyngeal space, concerning
for acute infection/cellulitis given provided history. There are a
few scattered dental caries within the adjacent right mandibular
molars, which could reflect the source of infection. No discrete
abscess or drainable fluid collection.
2. Mildly prominent right-sided cervical adenopathy as above,
nonspecific, but could be reactive.
3. No other acute abnormality identified within the neck.

## 2020-05-07 ENCOUNTER — Other Ambulatory Visit: Payer: Self-pay

## 2020-05-07 ENCOUNTER — Encounter: Payer: Self-pay | Admitting: Physician Assistant

## 2020-05-07 ENCOUNTER — Ambulatory Visit (INDEPENDENT_AMBULATORY_CARE_PROVIDER_SITE_OTHER): Payer: BC Managed Care – PPO | Admitting: Physician Assistant

## 2020-05-07 DIAGNOSIS — F411 Generalized anxiety disorder: Secondary | ICD-10-CM

## 2020-05-07 DIAGNOSIS — F4329 Adjustment disorder with other symptoms: Secondary | ICD-10-CM | POA: Diagnosis not present

## 2020-05-07 DIAGNOSIS — F5105 Insomnia due to other mental disorder: Secondary | ICD-10-CM | POA: Diagnosis not present

## 2020-05-07 DIAGNOSIS — F99 Mental disorder, not otherwise specified: Secondary | ICD-10-CM

## 2020-05-07 DIAGNOSIS — F4381 Prolonged grief disorder: Secondary | ICD-10-CM

## 2020-05-07 DIAGNOSIS — F314 Bipolar disorder, current episode depressed, severe, without psychotic features: Secondary | ICD-10-CM

## 2020-05-07 DIAGNOSIS — F1090 Alcohol use, unspecified, uncomplicated: Secondary | ICD-10-CM

## 2020-05-07 DIAGNOSIS — Z789 Other specified health status: Secondary | ICD-10-CM

## 2020-05-07 DIAGNOSIS — B20 Human immunodeficiency virus [HIV] disease: Secondary | ICD-10-CM

## 2020-05-07 DIAGNOSIS — Z7289 Other problems related to lifestyle: Secondary | ICD-10-CM

## 2020-05-07 DIAGNOSIS — F4321 Adjustment disorder with depressed mood: Secondary | ICD-10-CM

## 2020-05-07 MED ORDER — ALPRAZOLAM 0.5 MG PO TABS: ORAL_TABLET | ORAL | 0 refills | Status: DC

## 2020-05-07 MED ORDER — TRAZODONE HCL 100 MG PO TABS: 100.0000 mg | ORAL_TABLET | Freq: Every evening | ORAL | 0 refills | Status: DC | PRN

## 2020-05-07 MED ORDER — OXCARBAZEPINE 600 MG PO TABS: 600.0000 mg | ORAL_TABLET | Freq: Two times a day (BID) | ORAL | 1 refills | Status: DC

## 2020-05-07 MED ORDER — RISPERIDONE 3 MG PO TABS: 3.0000 mg | ORAL_TABLET | Freq: Every day | ORAL | 1 refills | Status: DC

## 2020-05-07 NOTE — Progress Notes (Signed)
[facility]  Patient ID: Laura Rogers,  MRN: [id number]  PCP: Campbell Riches, MD  Date of Evaluation: 05/07/2020 Time spent:45 minutes  Chief Complaint:  Chief Complaint    Anxiety; Depression; Insomnia      HISTORY/CURRENT STATUS: HPI For routine med check.  Lost her step mom, cousin, best friend, sister-in-law, another cousin, her cube-mate, and aunt all have died from covid in the past 1 1/2 years. She's from [place] and several of them died in the 1st wave.  And another cousin died from [place]. And then an auncle died by suicide.  'I'm losing all my family.  Everybody is dying,' she says in tears.  Has been drinking a lot more. "I drink tequila everday. I can't tell you how much I'm drinking." States she wants to quit drinking, or at least go back to one every few weeks, 'but I can't right now.  I've got another funeral to go to tomorrow and I can't handle it.'  Feels like the Risperdal has been working well until all this. She has been taking the Xanax up to 3 times a day recently because she has been more anxious and for sleep. Is working from home still and is able to do her job, but with all the grief, it's hard.  Doesn't drink until after work in the evening. Doesn't have any energy to do anything else except work.  Denies suicidal or homicidal thoughts.  Not sleeping well.  "That's why I drink and take the xanax but it's not helping. The Trazodone doesn't work anymore." Hard time going to sleep and staying asleep.  Is not in counseling right now.  Would like to be. Has seen counselors in the past that were helpful.   She is isolating but mostly b/c of covid and is HIV +. "I'm scared to death of getting covid.  But I'm scared to take the vaccine too.  I don't know what to do." Continues to see Dr. Johnnye Sima, ID, who recommends the shot.   Patient denies increased energy with decreased need for sleep, no increased talkativeness, no racing thoughts, no impulsivity or risky  behaviors, no increased spending, no increased libido, no grandiosity, no increased irritability or anger, no paranoia, and no hallucinations.  Denies dizziness, syncope, seizures, numbness, tingling, tremor, tics, unsteady gait, slurred speech, confusion. Denies muscle or joint pain, stiffness, or dystonia. Denies unexplained weight loss, frequent infections, or sores that heal slowly.  No polyphagia, polydipsia, or polyuria. Denies visual changes or paresthesias.   Individual Medical History/ Review of Systems: Changes? :No    Past medications for mental health diagnoses include: Risperdal, Zoloft, Restoril, trazodone, Xanax  Allergies: Levofloxacin, Valacyclovir hcl, Sulfamethoxazole-trimethoprim, Levofloxacin, Penicillins, and Bactrim  Current Medications:  Current Outpatient Medications:    acyclovir ointment (ZOVIRAX) 5 %, Apply 1 application topically every 3 (three) hours., Disp: 15 g, Rfl: 0   ALPRAZolam (XANAX) 0.5 MG tablet, 1 po bid and 1/2 po q afternoon., Disp: 120 tablet, Rfl: 0   emtricitabine-rilpivir-tenofovir AF (ODEFSEY) 200-25-25 MG TABS tablet, Take 1 tablet by mouth daily with breakfast. Try to take at the same time each day with a full meal, Disp: 30 tablet, Rfl: 5   risperiDONE (RISPERDAL) 3 MG tablet, Take 1 tablet (3 mg total) by mouth at bedtime., Disp: 30 tablet, Rfl: 1   ibuprofen (ADVIL) 800 MG tablet, Take 1 tablet (800 mg total) by mouth every 8 (eight) hours as needed for moderate pain. (Patient not taking: Reported on  01/07/2020), Disp: 30 tablet, Rfl: 0   naproxen (NAPROSYN) 375 MG tablet, Take 1 tablet (375 mg total) by mouth 2 (two) times daily as needed. (Patient not taking: Reported on 05/07/2020), Disp: 20 tablet, Rfl: 0   ondansetron (ZOFRAN) 4 MG tablet, TAKE 1 TABLET(4 MG) BY MOUTH EVERY 8 HOURS AS NEEDED FOR NAUSEA OR VOMITING (Patient not taking: Reported on 05/07/2020), Disp: 20 tablet, Rfl: 0   ondansetron (ZOFRAN) 4 MG tablet, Take 1 tablet (4  mg total) by mouth every 8 (eight) hours as needed for nausea or vomiting. (Patient not taking: Reported on 05/07/2020), Disp: 20 tablet, Rfl: 0   oxcarbazepine (TRILEPTAL) 600 MG tablet, Take 1 tablet (600 mg total) by mouth 2 (two) times daily., Disp: 60 tablet, Rfl: 1   psyllium (METAMUCIL SMOOTH TEXTURE) 28 % packet, Take 1 packet by mouth daily. (Patient not taking: Reported on 01/07/2020), Disp: 30 tablet, Rfl: 1   traMADol (ULTRAM) 50 MG tablet, Take 1 tablet (50 mg total) by mouth every 8 (eight) hours as needed. (Patient not taking: Reported on 10/07/2019), Disp: 30 tablet, Rfl: 0   traZODone (DESYREL) 100 MG tablet, Take 1-2 tablets (100-200 mg total) by mouth at bedtime as needed for sleep., Disp: 60 tablet, Rfl: 0 Medication Side Effects: none  Family Medical/ Social History: Changes? No  MENTAL HEALTH EXAM:  There were no vitals taken for this visit.There is no height or weight on file to calculate BMI.  General Appearance: Casual, Neat and Well Groomed  Eye Contact:  Good  Speech:  Clear and Coherent and Normal Rate  Volume:  Normal  Mood:  Anxious and Depressed  Affect:  Depressed, Tearful and Anxious  Thought Process:  Goal Directed and Descriptions of Associations: Intact  Orientation:  Full (Time, Place, and Person)  Thought Content: Logical   Suicidal Thoughts:  No  Homicidal Thoughts:  No  Memory:  WNL  Judgement:  Good  Insight:  Good  Psychomotor Activity:  Normal  Concentration:  Concentration: Fair due to stressors  Recall:  Good  Fund of Knowledge: Good  Language: Good  Assets:  Desire for Improvement  ADL's:  Intact  Cognition: WNL  Prognosis:  Good    DIAGNOSES:    ICD-10-CM   1. 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 Bipolar disorder with severe depression ([place])  F31.4   3. Generalized anxiety disorder  F41.1   4. Insomnia due to other mental disorder  F51.05    F99   5. Alcohol use  Z72.89   6. HIV disease ([place])  B20     Receiving  Psychotherapy: No    RECOMMENDATIONS:  PDMP was reviewed. I provided 45 minutes of face to face time during this encounter.  I discussed case with Dr. Clovis Pu.  Options were discussed w/ her: Do Librium taper, 25 mg, 1-2 po qid and reduce gradually over 7 days. Since she's not ready to quit drinking, she doesn't want to do this yet. And partly b/c she's not sure if she'll be able to get off work, which she will need to do. Decrease Xanax by 0.28m/d per week until gone. Increase Risperdal. Add Trileptal. After discussing pros and cons of these options, we decided to add Trileptal to help with anxiety and can prevent seizures, when we do start the Librium detox, and Xanax wean.  Start Trileptal 600 mg bid. If it makes her sleepy, only take qhs for the first few days. Continue Risperdal 364mmg 1 po qhs. Increase  Trazodone to 100 mg, 1-2 po qhs prn. Wean Xanax 0.5 mg (she has enough, she says) to 1 po q am, 1/2 mid-day, and 1 qhs for 1 week.  Xanax Rx RF was cancelled at pt's pharmacy by Perley Jain, [place]. Needs intermittent FMLA updated, out  2 times per month at 2 days each (16 hours)x 2.  And 3 hours twice per month for appointments. She may need continuous leave but not determined yet.  Refer to counselor in office if possible and as soon as possible. But if unable to d/t insurance, I'll recommend counselors in the community. Return in 1 week.  Donnal Moat, PA-C

## 2020-05-14 ENCOUNTER — Ambulatory Visit (INDEPENDENT_AMBULATORY_CARE_PROVIDER_SITE_OTHER): Payer: BC Managed Care – PPO | Admitting: Physician Assistant

## 2020-05-14 ENCOUNTER — Encounter: Payer: Self-pay | Admitting: Physician Assistant

## 2020-05-14 ENCOUNTER — Other Ambulatory Visit: Payer: Self-pay

## 2020-05-14 DIAGNOSIS — F411 Generalized anxiety disorder: Secondary | ICD-10-CM

## 2020-05-14 DIAGNOSIS — Z7289 Other problems related to lifestyle: Secondary | ICD-10-CM | POA: Diagnosis not present

## 2020-05-14 DIAGNOSIS — F99 Mental disorder, not otherwise specified: Secondary | ICD-10-CM

## 2020-05-14 DIAGNOSIS — F4321 Adjustment disorder with depressed mood: Secondary | ICD-10-CM

## 2020-05-14 DIAGNOSIS — F4329 Adjustment disorder with other symptoms: Secondary | ICD-10-CM

## 2020-05-14 DIAGNOSIS — F314 Bipolar disorder, current episode depressed, severe, without psychotic features: Secondary | ICD-10-CM

## 2020-05-14 DIAGNOSIS — Z789 Other specified health status: Secondary | ICD-10-CM

## 2020-05-14 DIAGNOSIS — B20 Human immunodeficiency virus [HIV] disease: Secondary | ICD-10-CM

## 2020-05-14 DIAGNOSIS — F5105 Insomnia due to other mental disorder: Secondary | ICD-10-CM
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
## 2020-05-21 ENCOUNTER — Telehealth: Payer: Self-pay | Admitting: Physician Assistant

## 2020-05-21 NOTE — Telephone Encounter (Signed)
FMLA forms have been faxed to number provided by [employer].

## 2020-05-22 NOTE — Telephone Encounter (Signed)
Noted thank you

## 2020-05-25 ENCOUNTER — Ambulatory Visit (INDEPENDENT_AMBULATORY_CARE_PROVIDER_SITE_OTHER): Payer: BC Managed Care – PPO | Admitting: Physician Assistant

## 2020-05-25 ENCOUNTER — Other Ambulatory Visit: Payer: Self-pay

## 2020-05-25 ENCOUNTER — Encounter: Payer: Self-pay | Admitting: Physician Assistant

## 2020-05-25 DIAGNOSIS — F314 Bipolar disorder, current episode depressed, severe, without psychotic features: Secondary | ICD-10-CM

## 2020-05-25 DIAGNOSIS — F411 Generalized anxiety disorder: Secondary | ICD-10-CM | POA: Diagnosis not present

## 2020-05-25 DIAGNOSIS — B20 Human immunodeficiency virus [HIV] disease: Secondary | ICD-10-CM

## 2020-05-25 DIAGNOSIS — F4321 Adjustment disorder with depressed mood: Secondary | ICD-10-CM

## 2020-05-25 DIAGNOSIS — F5105 Insomnia due to other mental disorder: Secondary | ICD-10-CM

## 2020-05-25 DIAGNOSIS — F4329 Adjustment disorder with other symptoms: Secondary | ICD-10-CM

## 2020-05-25 DIAGNOSIS — F1020 Alcohol dependence, uncomplicated: Secondary | ICD-10-CM | POA: Diagnosis not present

## 2020-05-25 DIAGNOSIS — F99 Mental disorder, not otherwise specified: Secondary | ICD-10-CM

## 2020-05-25 MED ORDER — THIAMINE HCL 100 MG PO TABS: 100.0000 mg | ORAL_TABLET | Freq: Every day | ORAL | 11 refills | Status: DC

## 2020-05-25 MED ORDER — CHLORDIAZEPOXIDE HCL 25 MG PO CAPS: ORAL_CAPSULE | ORAL | 0 refills | Status: DC

## 2020-05-25 MED ORDER — BUSPIRONE HCL 15 MG PO TABS: ORAL_TABLET | ORAL | 1 refills | Status: DC

## 2020-05-25 NOTE — Progress Notes (Signed)
[facility]  Patient ID: Laura Rogers,  MRN: [id number]  PCP: Campbell Riches, MD  Date of Evaluation: 05/25/2020 Time spent:45 minutes  Chief Complaint:  Chief Complaint    Anxiety; Depression      HISTORY/CURRENT STATUS:  Laura Rogers is even more overwhelmed than she had been at the last visit.  States she is ready to quit drinking.  Does not want alcohol to take over her life.  She is still drinking about the same, 3-5 drinks every night, wine or mixed drinks.  Would like help getting off of the alcohol.  She and her boyfriend are having problems.  She plans to stay with a cousin for several weeks, just to think things through and "try to get back on track."  She is depressed, has little energy or motivation although she has been working from home still.  It is very difficult to stay focused and do her job well.  That is something that she takes a lot of pride in so is disturbed that she cannot do better right now no matter how hard she tries.  States she wants to stay by herself and nobody bother her.  Cries easily.  Sleeping only a little bit better.  Hard time getting her mind to stop racing.  No suicidal or homicidal thoughts.  Patient denies increased energy with decreased need for sleep, no increased talkativeness, no racing thoughts, no impulsivity or risky behaviors, no increased spending, no increased libido, no grandiosity, no increased irritability or anger, no paranoia, and no hallucinations.  Review of Systems  Constitutional: Positive for malaise/fatigue.  HENT: Negative.   Eyes: Negative.   Respiratory: Negative.   Cardiovascular: Negative.   Gastrointestinal: Negative.   Genitourinary: Negative.   Musculoskeletal: Negative.   Skin: Negative.   Neurological: Negative.   Endo/Heme/Allergies: Negative.   Psychiatric/Behavioral: Positive for depression and substance abuse. The patient is nervous/anxious.     Individual Medical History/ Review of  Systems: Changes? :No    Past medications for mental health diagnoses include: Risperdal, Zoloft, Restoril, trazodone, Xanax  Allergies: Levofloxacin, Valacyclovir hcl, Sulfamethoxazole-trimethoprim, Levofloxacin, Penicillins, and Bactrim  Current Medications:  Current Outpatient Medications:    acyclovir ointment (ZOVIRAX) 5 %, Apply 1 application topically every 3 (three) hours., Disp: 15 g, Rfl: 0   ALPRAZolam (XANAX) 0.5 MG tablet, 1 po bid and 1/2 po q afternoon., Disp: 120 tablet, Rfl: 0   emtricitabine-rilpivir-tenofovir AF (ODEFSEY) 200-25-25 MG TABS tablet, Take 1 tablet by mouth daily with breakfast. Try to take at the same time each day with a full meal, Disp: 30 tablet, Rfl: 5   oxcarbazepine (TRILEPTAL) 600 MG tablet, Take 1 tablet (600 mg total) by mouth 2 (two) times daily., Disp: 60 tablet, Rfl: 1   risperiDONE (RISPERDAL) 3 MG tablet, Take 1 tablet (3 mg total) by mouth at bedtime., Disp: 30 tablet, Rfl: 1   traZODone (DESYREL) 100 MG tablet, Take 1-2 tablets (100-200 mg total) by mouth at bedtime as needed for sleep., Disp: 60 tablet, Rfl: 0   busPIRone (BUSPAR) 15 MG tablet, 1/3 po bid for 1 week, then 2/3 po bid, then 1 po bid., Disp: 60 tablet, Rfl: 1   chlordiazePOXIDE (LIBRIUM) 25 MG capsule, 2 p.o. 4 times daily for 2 days, 2 p.o. 3 times daily for 2 days, 1 p.o. 4 times daily for 2 days, 1 p.o. 3 times daily for 2 days, 1 p.o. twice daily for 2 days then stop., Disp: 46 capsule, Rfl: 0  ibuprofen (ADVIL) 800 MG tablet, Take 1 tablet (800 mg total) by mouth every 8 (eight) hours as needed for moderate pain. (Patient not taking: Reported on 01/07/2020), Disp: 30 tablet, Rfl: 0   naproxen (NAPROSYN) 375 MG tablet, Take 1 tablet (375 mg total) by mouth 2 (two) times daily as needed. (Patient not taking: Reported on 05/07/2020), Disp: 20 tablet, Rfl: 0   ondansetron (ZOFRAN) 4 MG tablet, TAKE 1 TABLET(4 MG) BY MOUTH EVERY 8 HOURS AS NEEDED FOR NAUSEA OR VOMITING  (Patient not taking: Reported on 05/07/2020), Disp: 20 tablet, Rfl: 0   ondansetron (ZOFRAN) 4 MG tablet, Take 1 tablet (4 mg total) by mouth every 8 (eight) hours as needed for nausea or vomiting. (Patient not taking: Reported on 05/07/2020), Disp: 20 tablet, Rfl: 0   psyllium (METAMUCIL SMOOTH TEXTURE) 28 % packet, Take 1 packet by mouth daily. (Patient not taking: Reported on 01/07/2020), Disp: 30 tablet, Rfl: 1   thiamine 100 MG tablet, Take 1 tablet (100 mg total) by mouth daily., Disp: 30 tablet, Rfl: 11   traMADol (ULTRAM) 50 MG tablet, Take 1 tablet (50 mg total) by mouth every 8 (eight) hours as needed. (Patient not taking: Reported on 10/07/2019), Disp: 30 tablet, Rfl: 0 Medication Side Effects: none  Family Medical/ Social History: Changes? No  MENTAL HEALTH EXAM:  There were no vitals taken for this visit.There is no height or weight on file to calculate BMI.  General Appearance: Casual, Neat and Well Groomed  Eye Contact:  Good  Speech:  Clear and Coherent and Normal Rate  Volume:  Normal  Mood:  Depressed  Affect:  Depressed  Thought Process:  Goal Directed and Descriptions of Associations: Intact  Orientation:  Full (Time, Place, and Person)  Thought Content: Logical   Suicidal Thoughts:  No  Homicidal Thoughts:  No  Memory:  WNL  Judgement:  Good  Insight:  Good  Psychomotor Activity:  Normal  Concentration:  Concentration: Fair due to stressors  Recall:  Good  Fund of Knowledge: Good  Language: Good  Assets:  Desire for Improvement  ADL's:  Intact  Cognition: WNL  Prognosis:  Good    DIAGNOSES:    ICD-10-CM   1. Uncomplicated alcohol dependence ([place])  F10.20   2. Bipolar disorder with severe depression ([place])  F31.4   3. Complicated bereavement  F43.29   4. Generalized anxiety disorder  F41.1   5. Insomnia due to other mental disorder  F51.05    F99   6. HIV disease ([place])  B20     Receiving Psychotherapy: No    RECOMMENDATIONS:  PDMP was  reviewed. I provided 45 minutes of face to face time during this encounter.  We discussed the different options for detox of alcohol.  Inpatient care is an option but she prefers not to go that route if at all possible.  We can have her go inpatient at any time but as long as she is compliant with Librium dosing, stopping Xanax, and taking the Trileptal, the risk of seizure is greatly decreased.  She understands and would like to do this at home.  She will have to stay out of work in order for this to be successful. Stop alcohol. Stop Xanax. Start Librium 25 mg, 2 p.o. 4 times daily for 2 days then 2 p.o. 3 times daily for 2 days, 1 p.o. 4 times daily for 2 days, then 1 p.o. 3 times daily for 2 days, then 1 p.o. twice daily for 2  days and then stop. Start BuSpar 15 mg, one third p.o. twice daily for 1 week, two thirds p.o. twice daily for 1 week, and then 1 p.o. twice daily. Continue Trileptal 600 mg, 1 p.o. twice daily. Continue Risperdal 3 mg, 1 p.o. nightly. Continue trazodone 100 mg, 1-2 nightly as needed sleep. Start thiamine OTC. Continuous leave starting today until 06/22/2020. Go to behavioral health urgent care if her condition worsens before the next appointment. Return in 2 weeks.  Laura Moat, Laura Rogers

## 2020-06-04 ENCOUNTER — Telehealth: Payer: Self-pay | Admitting: Physician Assistant

## 2020-06-04 NOTE — Telephone Encounter (Signed)
Please fax form to her at

## 2020-06-04 NOTE — Telephone Encounter (Signed)
Maly Lemarr called, July 18, 1976 called to see if the paperwork had been sent to her employer yet. They said they didn't have anything. [phone].

## 2020-06-04 NOTE — Telephone Encounter (Signed)
Laura Rogers is working on her paperwork

## 2020-06-04 NOTE — Telephone Encounter (Signed)
Paper work has been completed. It is awaiting Teresa's signature.

## 2020-06-08 NOTE — Telephone Encounter (Signed)
This has been completed. Patient has picked up

## 2020-06-10 ENCOUNTER — Ambulatory Visit (INDEPENDENT_AMBULATORY_CARE_PROVIDER_SITE_OTHER): Payer: BC Managed Care – PPO | Admitting: Physician Assistant

## 2020-06-10 ENCOUNTER — Other Ambulatory Visit: Payer: Self-pay

## 2020-06-10 ENCOUNTER — Encounter: Payer: Self-pay | Admitting: Physician Assistant

## 2020-06-10 DIAGNOSIS — F4329 Adjustment disorder with other symptoms: Secondary | ICD-10-CM | POA: Diagnosis not present

## 2020-06-10 DIAGNOSIS — F5105 Insomnia due to other mental disorder: Secondary | ICD-10-CM

## 2020-06-10 DIAGNOSIS — Z789 Other specified health status: Secondary | ICD-10-CM

## 2020-06-10 DIAGNOSIS — F411 Generalized anxiety disorder: Secondary | ICD-10-CM | POA: Diagnosis not present

## 2020-06-10 DIAGNOSIS — F4321 Adjustment disorder with depressed mood: Secondary | ICD-10-CM

## 2020-06-10 DIAGNOSIS — F314 Bipolar disorder, current episode depressed, severe, without psychotic features: Secondary | ICD-10-CM

## 2020-06-10 DIAGNOSIS — F99 Mental disorder, not otherwise specified: Secondary | ICD-10-CM

## 2020-06-10 DIAGNOSIS — Z7289 Other problems related to lifestyle: Secondary | ICD-10-CM

## 2020-06-10 MED ORDER — TRAZODONE HCL 100 MG PO TABS: 200.0000 mg | ORAL_TABLET | Freq: Every day | ORAL | 0 refills | Status: DC

## 2020-06-10 MED ORDER — BUSPIRONE HCL 15 MG PO TABS: 15.0000 mg | ORAL_TABLET | Freq: Two times a day (BID) | ORAL | 1 refills | Status: DC

## 2020-06-10 MED ORDER — ALPRAZOLAM 0.5 MG PO TABS: 0.5000 mg | ORAL_TABLET | Freq: Two times a day (BID) | ORAL | 0 refills | Status: DC | PRN

## 2020-06-10 NOTE — Progress Notes (Signed)
[facility]  Patient ID: Laura Rogers,  MRN: [id number]  PCP: Campbell Riches, MD  Date of Evaluation: 06/10/2020 Time spent:40 minutes  Chief Complaint:  Chief Complaint    Depression; Anxiety      HISTORY/CURRENT STATUS:  Mariany has quit drinking completely.  No ETOH since 05/25/2020. She took Librium as directed. Somehow she still has left over Xanax and has been using those for the past couple of days since she's off the Librium.   Is still depressed and very sad with all the deaths in her family in the past 1.5 years. Feels hopeless at times but is a little better than when she was last here. She and her boyfriend are still having problems.  She is staying with her cousin in [place] and that's helping right now.  Energy and motivation are still low and she's needing to sleep more.  Cries easily but not as bad. Still has racing thoughts. Not working right now as ordered. Unable to focus and do things to the best of her ability. No suicidal or homicidal thoughts.  Anxiety is still a problem. Buspar isn't doing anything. She's only been taking one/day and is supposed to be on bid. Trileptal isn't helping anxiety either. Not sleeping well b/c mind won't calm down at bedtime.   Patient denies increased energy with decreased need for sleep, no increased talkativeness, no racing thoughts, no impulsivity or risky behaviors, no increased spending, no increased libido, no grandiosity, no increased irritability or anger, no paranoia, and no hallucinations.  Review of Systems  Constitutional: Positive for malaise/fatigue.  HENT: Negative.   Eyes: Negative.   Respiratory: Negative.   Cardiovascular: Negative.   Gastrointestinal: Negative.   Genitourinary: Negative.   Musculoskeletal: Negative.   Skin: Negative.   Neurological: Negative.   Endo/Heme/Allergies: Negative.   Psychiatric/Behavioral: Positive for depression and substance abuse. The patient is nervous/anxious.      Individual Medical History/ Review of Systems: Changes? :No    Past medications for mental health diagnoses include: Risperdal, Zoloft, Restoril, trazodone, Xanax  Allergies: Levofloxacin, Valacyclovir hcl, Sulfamethoxazole-trimethoprim, Levofloxacin, Penicillins, and Bactrim  Current Medications:  Current Outpatient Medications:  .  acyclovir ointment (ZOVIRAX) 5 %, Apply 1 application topically every 3 (three) hours., Disp: 15 g, Rfl: 0 .  ALPRAZolam (XANAX) 0.5 MG tablet, Take 1 tablet (0.5 mg total) by mouth 2 (two) times daily as needed for anxiety. Take very sparingly, Disp: 120 tablet, Rfl: 0 .  busPIRone (BUSPAR) 15 MG tablet, Take 1 tablet (15 mg total) by mouth 2 (two) times daily., Disp: 60 tablet, Rfl: 1 .  emtricitabine-rilpivir-tenofovir AF (ODEFSEY) 200-25-25 MG TABS tablet, Take 1 tablet by mouth daily with breakfast. Try to take at the same time each day with a full meal, Disp: 30 tablet, Rfl: 5 .  oxcarbazepine (TRILEPTAL) 600 MG tablet, Take 1 tablet (600 mg total) by mouth 2 (two) times daily., Disp: 60 tablet, Rfl: 1 .  risperiDONE (RISPERDAL) 3 MG tablet, Take 1 tablet (3 mg total) by mouth at bedtime., Disp: 30 tablet, Rfl: 1 .  thiamine 100 MG tablet, Take 1 tablet (100 mg total) by mouth daily., Disp: 30 tablet, Rfl: 11 .  traZODone (DESYREL) 100 MG tablet, Take 2 tablets (200 mg total) by mouth at bedtime., Disp: 60 tablet, Rfl: 0 .  ibuprofen (ADVIL) 800 MG tablet, Take 1 tablet (800 mg total) by mouth every 8 (eight) hours as needed for moderate pain. (Patient not taking: Reported on 01/07/2020), Disp:  30 tablet, Rfl: 0 .  naproxen (NAPROSYN) 375 MG tablet, Take 1 tablet (375 mg total) by mouth 2 (two) times daily as needed. (Patient not taking: Reported on 05/07/2020), Disp: 20 tablet, Rfl: 0 .  ondansetron (ZOFRAN) 4 MG tablet, TAKE 1 TABLET(4 MG) BY MOUTH EVERY 8 HOURS AS NEEDED FOR NAUSEA OR VOMITING (Patient not taking: Reported on 05/07/2020), Disp: 20 tablet,  Rfl: 0 .  ondansetron (ZOFRAN) 4 MG tablet, Take 1 tablet (4 mg total) by mouth every 8 (eight) hours as needed for nausea or vomiting. (Patient not taking: Reported on 05/07/2020), Disp: 20 tablet, Rfl: 0 .  psyllium (METAMUCIL SMOOTH TEXTURE) 28 % packet, Take 1 packet by mouth daily. (Patient not taking: Reported on 01/07/2020), Disp: 30 tablet, Rfl: 1 .  traMADol (ULTRAM) 50 MG tablet, Take 1 tablet (50 mg total) by mouth every 8 (eight) hours as needed. (Patient not taking: Reported on 10/07/2019), Disp: 30 tablet, Rfl: 0 Medication Side Effects: none  Family Medical/ Social History: Changes? No  MENTAL HEALTH EXAM:  There were no vitals taken for this visit.There is no height or weight on file to calculate BMI.  General Appearance: Casual, Neat and Well Groomed  Eye Contact:  Good  Speech:  Clear and Coherent and Normal Rate  Volume:  Normal  Mood:  Depressed  Affect:  Depressed  Thought Process:  Goal Directed and Descriptions of Associations: Intact  Orientation:  Full (Time, Place, and Person)  Thought Content: Logical   Suicidal Thoughts:  No  Homicidal Thoughts:  No  Memory:  WNL  Judgement:  Good  Insight:  Good  Psychomotor Activity:  Normal  Concentration:  Concentration: Fair due to stressors  Recall:  Good  Fund of Knowledge: Good  Language: Good  Assets:  Desire for Improvement  ADL's:  Intact  Cognition: WNL  Prognosis:  Good    DIAGNOSES:    ICD-10-CM   1. 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 Insomnia due to other mental disorder  F51.05    F99   3. Bipolar disorder with severe depression ([place])  F31.4   4. Generalized anxiety disorder  F41.1   5. Alcohol use  Z72.89     Receiving Psychotherapy: No    RECOMMENDATIONS:  PDMP was reviewed. I provided 40 minutes of face to face time during this encounter.   States she only has around 20 Xanax left. Reminded her I won't Rx more, so she needs to take it very sparingly. I don't want to have to wean her off  that too.  Discussed sleep hygiene. Can increase dose of Traz. Increase Buspar 15 mg to 1 po bid. Continue Trileptal 600 mg, 1 p.o. twice daily. Continue Risperdal 3 mg, 1 p.o. nightly. Increase Trazodone 100 mg to 2 nightly as needed sleep. Take routinely for a week or so and then can try it prn. Continue thiamine OTC. Continuous until 06/22/2020. Go to behavioral health urgent care if her condition worsens before the next appointment. Return in 4 weeks.  Donnal Moat, PA-C

## 2020-06-11 ENCOUNTER — Other Ambulatory Visit: Payer: Self-pay | Admitting: Physician Assistant

## 2020-06-11 ENCOUNTER — Telehealth: Payer: Self-pay | Admitting: Physician Assistant

## 2020-06-11 NOTE — Telephone Encounter (Signed)
Form given to [facility]

## 2020-06-11 NOTE — Telephone Encounter (Signed)
Please review

## 2020-06-11 NOTE — Telephone Encounter (Signed)
Laura Rogers called and said that because her LOA paperwork was late getting in, they put her on unpaid leave.  She has added STD insurance through [employer] to will covering her getting pd.  Those forms are received and given to [place].  She won't get paid until these forms are submitted.

## 2020-06-12 ENCOUNTER — Telehealth: Payer: Self-pay | Admitting: Physician Assistant

## 2020-06-12 DIAGNOSIS — Z0289 Encounter for other administrative examinations: Secondary | ICD-10-CM

## 2020-06-12 NOTE — Telephone Encounter (Signed)
STD forms for the [facility] have been complete, signed and faxed to the [facility] with records

## 2020-06-18 ENCOUNTER — Ambulatory Visit (INDEPENDENT_AMBULATORY_CARE_PROVIDER_SITE_OTHER): Payer: BC Managed Care – PPO | Admitting: Physician Assistant

## 2020-06-18 ENCOUNTER — Other Ambulatory Visit: Payer: Self-pay

## 2020-06-18 ENCOUNTER — Encounter: Payer: Self-pay | Admitting: Physician Assistant

## 2020-06-18 DIAGNOSIS — F4321 Adjustment disorder with depressed mood: Secondary | ICD-10-CM | POA: Diagnosis not present

## 2020-06-18 DIAGNOSIS — F314 Bipolar disorder, current episode depressed, severe, without psychotic features: Secondary | ICD-10-CM

## 2020-06-18 DIAGNOSIS — F99 Mental disorder, not otherwise specified: Secondary | ICD-10-CM

## 2020-06-18 DIAGNOSIS — F5105 Insomnia due to other mental disorder: Secondary | ICD-10-CM

## 2020-06-18 DIAGNOSIS — Z789 Other specified health status: Secondary | ICD-10-CM

## 2020-06-18 DIAGNOSIS — F411 Generalized anxiety disorder: Secondary | ICD-10-CM

## 2020-06-18 DIAGNOSIS — Z7289 Other problems related to lifestyle: Secondary | ICD-10-CM

## 2020-06-18 MED ORDER — ACAMPROSATE CALCIUM 333 MG PO TBEC: 666.0000 mg | DELAYED_RELEASE_TABLET | Freq: Three times a day (TID) | ORAL | 0 refills | Status: DC

## 2020-06-18 MED ORDER — HYDROXYZINE HCL 25 MG PO TABS: 12.5000 mg | ORAL_TABLET | Freq: Three times a day (TID) | ORAL | 1 refills | Status: DC | PRN
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
## 2020-06-18 NOTE — Telephone Encounter (Signed)
reviewed

## 2020-06-19 ENCOUNTER — Telehealth: Payer: Self-pay | Admitting: Physician Assistant

## 2020-06-19 NOTE — Telephone Encounter (Signed)
[facility] faxed an Attending 31 Statement for [facility].

## 2020-06-19 NOTE — Telephone Encounter (Signed)
Error

## 2020-06-26 DIAGNOSIS — Z0289 Encounter for other administrative examinations: Secondary | ICD-10-CM

## 2020-07-10 ENCOUNTER — Encounter: Payer: Self-pay | Admitting: Physician Assistant

## 2020-07-10 ENCOUNTER — Other Ambulatory Visit: Payer: Self-pay

## 2020-07-10 ENCOUNTER — Ambulatory Visit (INDEPENDENT_AMBULATORY_CARE_PROVIDER_SITE_OTHER): Payer: BC Managed Care – PPO | Admitting: Physician Assistant

## 2020-07-10 DIAGNOSIS — F314 Bipolar disorder, current episode depressed, severe, without psychotic features: Secondary | ICD-10-CM

## 2020-07-10 DIAGNOSIS — F1011 Alcohol abuse, in remission: Secondary | ICD-10-CM

## 2020-07-10 DIAGNOSIS — F411 Generalized anxiety disorder: Secondary | ICD-10-CM | POA: Diagnosis not present

## 2020-07-10 MED ORDER — HYDROXYZINE HCL 25 MG PO TABS: 12.5000 mg | ORAL_TABLET | Freq: Three times a day (TID) | ORAL | 1 refills | Status: DC | PRN

## 2020-07-10 MED ORDER — ACAMPROSATE CALCIUM 333 MG PO TBEC: 666.0000 mg | DELAYED_RELEASE_TABLET | Freq: Three times a day (TID) | ORAL | 1 refills | Status: DC

## 2020-07-10 MED ORDER — OXCARBAZEPINE 600 MG PO TABS: 600.0000 mg | ORAL_TABLET | Freq: Two times a day (BID) | ORAL | 1 refills | Status: DC

## 2020-07-10 MED ORDER — RISPERIDONE 3 MG PO TABS: 3.0000 mg | ORAL_TABLET | Freq: Every day | ORAL | 1 refills | Status: DC

## 2020-07-10 MED ORDER — TRAZODONE HCL 100 MG PO TABS: 200.0000 mg | ORAL_TABLET | Freq: Every day | ORAL | 1 refills | Status: DC

## 2020-07-10 MED ORDER — BUSPIRONE HCL 15 MG PO TABS: 15.0000 mg | ORAL_TABLET | Freq: Two times a day (BID) | ORAL | 1 refills | Status: DC

## 2020-07-10 NOTE — Progress Notes (Signed)
[facility]  Patient ID: Laura Rogers,  MRN: [id number]  PCP: Campbell Riches, MD  Date of Evaluation: 07/10/2020 Time spent:20 minutes  Chief Complaint:  Chief Complaint    Anxiety; Depression      HISTORY/CURRENT STATUS: HPI For routine f/u.  Is going back to work next week.  "I think I'm better.  I want to try. I'm not as depressed and anxious like I was. I haven't had any alcohol since our last appointment. So I'm good."  She and her BF are trying to work things out. But didn't move back in together. Is more able to enjoy things. Energy and motivation have improved. Not crying all the time. Appetite is nl. No SI/HI.  Patient denies increased energy with decreased need for sleep, no increased talkativeness, no racing thoughts, no impulsivity or risky behaviors, no increased spending, no increased libido, no grandiosity, no increased irritability or anger, and no hallucinations.  Denies dizziness, syncope, seizures, numbness, tingling, tremor, tics, unsteady gait, slurred speech, confusion. Denies muscle or joint pain, stiffness, or dystonia.  Individual Medical History/ Review of Systems: Changes? :No    Past medications for mental health diagnoses include: Risperdal, Zoloft, Restoril, trazodone, Xanax  Allergies: Levofloxacin, Valacyclovir hcl, Sulfamethoxazole-trimethoprim, Levofloxacin, Penicillins, and Bactrim  Current Medications:  Current Outpatient Medications:  .  acamprosate (CAMPRAL) 333 MG tablet, Take 2 tablets (666 mg total) by mouth 3 (three) times daily with meals., Disp: 180 tablet, Rfl: 1 .  acyclovir ointment (ZOVIRAX) 5 %, Apply 1 application topically every 3 (three) hours., Disp: 15 g, Rfl: 0 .  busPIRone (BUSPAR) 15 MG tablet, Take 1 tablet (15 mg total) by mouth 2 (two) times daily., Disp: 60 tablet, Rfl: 1 .  emtricitabine-rilpivir-tenofovir AF (ODEFSEY) 200-25-25 MG TABS tablet, Take 1 tablet by mouth daily with breakfast. Try to  take at the same time each day with a full meal, Disp: 30 tablet, Rfl: 5 .  hydrOXYzine (ATARAX/VISTARIL) 25 MG tablet, Take 0.5-1 tablets (12.5-25 mg total) by mouth 3 (three) times daily as needed for anxiety., Disp: 30 tablet, Rfl: 1 .  oxcarbazepine (TRILEPTAL) 600 MG tablet, Take 1 tablet (600 mg total) by mouth 2 (two) times daily., Disp: 60 tablet, Rfl: 1 .  risperiDONE (RISPERDAL) 3 MG tablet, Take 1 tablet (3 mg total) by mouth at bedtime., Disp: 30 tablet, Rfl: 1 .  thiamine 100 MG tablet, Take 1 tablet (100 mg total) by mouth daily., Disp: 30 tablet, Rfl: 11 .  traZODone (DESYREL) 100 MG tablet, Take 2 tablets (200 mg total) by mouth at bedtime., Disp: 60 tablet, Rfl: 1 .  ibuprofen (ADVIL) 800 MG tablet, Take 1 tablet (800 mg total) by mouth every 8 (eight) hours as needed for moderate pain. (Patient not taking: Reported on 01/07/2020), Disp: 30 tablet, Rfl: 0 .  naproxen (NAPROSYN) 375 MG tablet, Take 1 tablet (375 mg total) by mouth 2 (two) times daily as needed. (Patient not taking: Reported on 05/07/2020), Disp: 20 tablet, Rfl: 0 .  ondansetron (ZOFRAN) 4 MG tablet, TAKE 1 TABLET(4 MG) BY MOUTH EVERY 8 HOURS AS NEEDED FOR NAUSEA OR VOMITING (Patient not taking: Reported on 05/07/2020), Disp: 20 tablet, Rfl: 0 .  ondansetron (ZOFRAN) 4 MG tablet, Take 1 tablet (4 mg total) by mouth every 8 (eight) hours as needed for nausea or vomiting. (Patient not taking: Reported on 05/07/2020), Disp: 20 tablet, Rfl: 0 .  psyllium (METAMUCIL SMOOTH TEXTURE) 28 % packet, Take 1 packet by mouth daily. (Patient  not taking: Reported on 01/07/2020), Disp: 30 tablet, Rfl: 1 .  traMADol (ULTRAM) 50 MG tablet, Take 1 tablet (50 mg total) by mouth every 8 (eight) hours as needed. (Patient not taking: Reported on 10/07/2019), Disp: 30 tablet, Rfl: 0 Medication Side Effects: none  Family Medical/ Social History: Changes? Yes see HPI.  MENTAL HEALTH EXAM:  There were no vitals taken for this visit.There is no  height or weight on file to calculate BMI.  General Appearance: Casual, Neat and Well Groomed  Eye Contact:  Good  Speech:  Clear and Coherent and Normal Rate  Volume:  Normal  Mood:  Euthymic  Affect:  Appropriate  Thought Process:  Goal Directed and Descriptions of Associations: Intact  Orientation:  Full (Time, Place, and Person)  Thought Content: Logical   Suicidal Thoughts:  No  Homicidal Thoughts:  No  Memory:  WNL  Judgement:  Good  Insight:  Good  Psychomotor Activity:  Normal  Concentration:  Concentration: Good and Attention Span: Good  Recall:  Good  Fund of Knowledge: Good  Language: Good  Assets:  Desire for Improvement  ADL's:  Intact  Cognition: WNL  Prognosis:  Good    DIAGNOSES:    ICD-10-CM   1. Bipolar disorder with severe depression ([place])  F31.4   2. Generalized anxiety disorder  F41.1   3. History of alcohol abuse  F10.11     Receiving Psychotherapy: No     RECOMMENDATIONS:  PDMP was reviewed. I provided 20 minutes of face-to-face time during this encounter. I'm glad to see her doing so much better.  Continue Campral 333 mg, 2 p.o. 3 times daily. Continue BuSpar 15 mg, 1 p.o. twice daily. Continue hydroxyzine 25 mg, 1/2-1 p.o. 3 times daily as needed anxiety. Continue Trileptal 600 mg, 1 p.o. twice daily. Continue Risperdal 3 mg, 1 p.o. nightly. Continue thiamine 100 mg, 1 p.o. daily. Continue trazodone 100 mg, 1-2 p.o. nightly as needed sleep. Recommend counseling. Back to work on 07/15/2020.  Return in 6 weeks.   Donnal Moat, PA-C

## 2020-07-13 ENCOUNTER — Telehealth: Payer: Self-pay | Admitting: Physician Assistant

## 2020-07-13 NOTE — Telephone Encounter (Signed)
Received fax from Nat Christen and she included a fax from [employer] needing a RTW form completed.

## 2020-07-14 ENCOUNTER — Other Ambulatory Visit: Payer: Self-pay | Admitting: Physician Assistant

## 2020-07-15 ENCOUNTER — Telehealth: Payer: Self-pay | Admitting: Physician Assistant

## 2020-07-15 NOTE — Telephone Encounter (Signed)
Her work note that Laura Rogers signed on 11/2 was faxed from the office to her employer HR dept on 11/2. Laura Rogers called 11/3 to say that HR dept did not receive the fax. She came by today to have [place] refax it and I copied the form for her chart. Fax did go thru successfully today. Pt noted she may need a work note for today to cover her since they would not let her return to work today.

## 2020-07-15 NOTE — Telephone Encounter (Signed)
Noted  

## 2020-07-16 NOTE — Telephone Encounter (Signed)
Form completed given to Helene Kelp to sign

## 2020-07-17 ENCOUNTER — Other Ambulatory Visit: Payer: Self-pay | Admitting: Physician Assistant

## 2020-07-21 ENCOUNTER — Other Ambulatory Visit: Payer: Self-pay | Admitting: Physician Assistant

## 2020-09-14 ENCOUNTER — Ambulatory Visit: Payer: Medicare Other | Admitting: Physician Assistant

## 2020-09-16 ENCOUNTER — Other Ambulatory Visit: Payer: Self-pay

## 2020-09-16 DIAGNOSIS — B20 Human immunodeficiency virus [HIV] disease: Secondary | ICD-10-CM

## 2020-09-16 DIAGNOSIS — Z79899 Other long term (current) drug therapy: Secondary | ICD-10-CM

## 2020-09-16 DIAGNOSIS — Z113 Encounter for screening for infections with a predominantly sexual mode of transmission: Secondary | ICD-10-CM

## 2020-09-17 ENCOUNTER — Telehealth: Payer: Self-pay | Admitting: *Deleted

## 2020-09-17 DIAGNOSIS — Z03818 Encounter for observation for suspected exposure to other biological agents ruled out: Secondary | ICD-10-CM | POA: Diagnosis not present

## 2020-09-17 NOTE — Telephone Encounter (Signed)
Let's see what urgent care says.  I would continue over the counter rx (robitussin, nyquil) for now.  thanks

## 2020-09-17 NOTE — Telephone Encounter (Signed)
Patient called for advice about symptoms that started 12/31, are still persistent. She states her head hurts, she has a tight cough, chest and head congestion, stomach pain, no taste, and now her ribs hurt from coughing. She reports a fever Tues/Wed 99.1. She previously took dayquil, nyquil without much relief; mucinex extra strength made her throw up. Now taking tylenol, robitussin DM and feels her cough is better, more productive. She is worried about ongestion in nose, head and lungs - cannot cough up anything. She still wakes at night, "feeling like something is holding on to her lungs."  She is trying to stay hydrated with gatorade, OJ.  She has difficulty eating due to lack of taste and no appetite. She has been off odefsey for 5 months, restarted 12/31 when she started feeling bad. She has an urgent care appointment 1/7, is going for drive through covid testing today. She has not received the covid 19 vaccine.   Please advise if there is anything else she can do or take to help her symptoms. She is scared. Landis Gandy, RN

## 2020-09-17 NOTE — Telephone Encounter (Signed)
Relayed to patient, she was tested at [facility] this afternoon. Landis Gandy, RN

## 2020-09-18 DIAGNOSIS — R059 Cough, unspecified: Secondary | ICD-10-CM | POA: Diagnosis not present

## 2020-09-18 DIAGNOSIS — U071 COVID-19: Secondary | ICD-10-CM | POA: Diagnosis not present

## 2020-09-18 DIAGNOSIS — Z20822 Contact with and (suspected) exposure to covid-19: Secondary | ICD-10-CM | POA: Diagnosis not present

## 2020-09-22 ENCOUNTER — Other Ambulatory Visit: Payer: Self-pay | Admitting: Infectious Diseases

## 2020-09-22 NOTE — Telephone Encounter (Signed)
Can she go to infusion clinic? thanks

## 2020-09-22 NOTE — Telephone Encounter (Signed)
Called pt- she has some mild congestion. Has been taking azithro, benzonate.  I offered to refer her mAb clinic but she felt like she was too well.  I asked her to let us know if she does not feel better.  I have a f/u with her on 10-08-20

## 2020-09-24 ENCOUNTER — Telehealth: Payer: Self-pay

## 2020-09-24 ENCOUNTER — Other Ambulatory Visit: Payer: Self-pay | Admitting: Infectious Diseases

## 2020-09-24 DIAGNOSIS — U071 COVID-19: Secondary | ICD-10-CM

## 2020-09-24 MED ORDER — BENZONATATE 100 MG PO CAPS: 100.0000 mg | ORAL_CAPSULE | Freq: Three times a day (TID) | ORAL | 0 refills | Status: DC | PRN

## 2020-09-24 NOTE — Telephone Encounter (Signed)
error 

## 2020-09-24 NOTE — Telephone Encounter (Signed)
RN spoke to patient to let her know that Dr. Johnnye Sima will send in more benzonatate. Patient verbalized understanding and has no further questions.   Beryle Flock, RN

## 2020-09-24 NOTE — Telephone Encounter (Signed)
Received call from patient, she states she tested positive for COVID on 09/18/20, but that her symptoms started on 09/15/20. RN informed her that she is outside of the 7 day window for infusion treatment. She states that she can't get rid of her cough and has some shortness of breath. She says the benzonatate seems to help, she has some of this left, but is requesting additional azithromycin. She is having some pain in her chest and headache from all of the coughing. RN advised her that if she develops severe shortness of breath or difficulty breathing to go to the emergency department, patient verbalized understanding. She is wondering if anything else can be done for her symptoms. Will route to provider.   Beryle Flock, RN

## 2020-09-24 NOTE — Telephone Encounter (Signed)
Ok to refill benzonate thanks

## 2020-09-28 ENCOUNTER — Other Ambulatory Visit: Payer: Self-pay

## 2020-09-29 ENCOUNTER — Other Ambulatory Visit: Payer: Self-pay

## 2020-09-29 ENCOUNTER — Other Ambulatory Visit: Payer: BC Managed Care – PPO

## 2020-09-29 ENCOUNTER — Other Ambulatory Visit (HOSPITAL_COMMUNITY)
Admission: RE | Admit: 2020-09-29 | Discharge: 2020-09-29 | Disposition: A | Discharge status: Home / Self Care | Payer: BC Managed Care – PPO | Source: Ambulatory Visit | Attending: Infectious Diseases | Admitting: Infectious Diseases

## 2020-09-29 DIAGNOSIS — B20 Human immunodeficiency virus [HIV] disease: Secondary | ICD-10-CM

## 2020-09-29 DIAGNOSIS — Z113 Encounter for screening for infections with a predominantly sexual mode of transmission: Secondary | ICD-10-CM

## 2020-09-29 DIAGNOSIS — Z79899 Other long term (current) drug therapy: Secondary | ICD-10-CM | POA: Diagnosis not present

## 2020-09-30 LAB — RPR: RPR Ser Ql: NONREACTIVE

## 2020-09-30 LAB — CBC WITH DIFFERENTIAL/PLATELET
Absolute Monocytes: 429 cells/uL (ref 200–950)
Basophils Absolute: 33 cells/uL (ref 0–200)
Basophils Relative: 0.5 %
Eosinophils Absolute: 79 cells/uL (ref 15–500)
Eosinophils Relative: 1.2 %
HCT: 37.6 % (ref 35.0–45.0)
Hemoglobin: 13.1 g/dL (ref 11.7–15.5)
Lymphs Abs: 2528 cells/uL (ref 850–3900)
MCH: 31.1 pg (ref 27.0–33.0)
MCHC: 34.8 g/dL (ref 32.0–36.0)
MCV: 89.3 fL (ref 80.0–100.0)
MPV: 12.6 fL — ABNORMAL HIGH (ref 7.5–12.5)
Monocytes Relative: 6.5 %
Neutro Abs: 3531 cells/uL (ref 1500–7800)
Neutrophils Relative %: 53.5 %
Platelets: 218 10*3/uL (ref 140–400)
RBC: 4.21 10*6/uL (ref 3.80–5.10)
RDW: 12.8 % (ref 11.0–15.0)
Total Lymphocyte: 38.3 %
WBC: 6.6 10*3/uL (ref 3.8–10.8)

## 2020-09-30 LAB — LIPID PANEL
Cholesterol: 180 mg/dL (ref <200)
HDL: 34 mg/dL — ABNORMAL LOW (ref >=50)
LDL Cholesterol (Calc): 103 mg/dL (calc) — ABNORMAL HIGH
Non-HDL Cholesterol (Calc): 146 mg/dL (calc) — ABNORMAL HIGH (ref <130)
Total CHOL/HDL Ratio: 5.3 (calc) — ABNORMAL HIGH (ref <5.0)
Triglycerides: 319 mg/dL — ABNORMAL HIGH (ref <150)

## 2020-09-30 LAB — COMPLETE METABOLIC PANEL WITH GFR
AG Ratio: 1.6 (calc) (ref 1.0–2.5)
ALT: 18 U/L (ref 6–29)
AST: 13 U/L (ref 10–30)
Albumin: 4.1 g/dL (ref 3.6–5.1)
Alkaline phosphatase (APISO): 51 U/L (ref 31–125)
BUN: 10 mg/dL (ref 7–25)
CO2: 25 mmol/L (ref 20–32)
Calcium: 9.1 mg/dL (ref 8.6–10.2)
Chloride: 104 mmol/L (ref 98–110)
Creat: 0.78 mg/dL (ref 0.50–1.10)
GFR, Est African American: 108 mL/min/{1.73_m2} (ref >=60)
GFR, Est Non African American: 93 mL/min/{1.73_m2} (ref >=60)
Globulin: 2.6 g/dL (calc) (ref 1.9–3.7)
Glucose, Bld: 86 mg/dL (ref 65–99)
Potassium: 3.9 mmol/L (ref 3.5–5.3)
Sodium: 138 mmol/L (ref 135–146)
Total Bilirubin: 0.2 mg/dL (ref 0.2–1.2)
Total Protein: 6.7 g/dL (ref 6.1–8.1)

## 2020-09-30 LAB — T-HELPER CELL (CD4) - (RCID CLINIC ONLY)
CD4 % Helper T Cell: 17 % — ABNORMAL LOW (ref 33–65)
CD4 T Cell Abs: 421 /uL (ref 400–1790)

## 2020-10-01 LAB — URINE CYTOLOGY ANCILLARY ONLY
Chlamydia: NEGATIVE
Comment: NEGATIVE
Comment: NORMAL
Neisseria Gonorrhea: NEGATIVE

## 2020-10-01 LAB — HIV-1 RNA QUANT-NO REFLEX-BLD
HIV 1 RNA Quant: <20 Copies/mL — ABNORMAL HIGH
HIV-1 RNA Quant, Log: <1.3 Log cps/mL — ABNORMAL HIGH

## 2020-10-06 ENCOUNTER — Encounter: Payer: Self-pay | Admitting: Infectious Diseases

## 2020-10-06 ENCOUNTER — Ambulatory Visit (INDEPENDENT_AMBULATORY_CARE_PROVIDER_SITE_OTHER): Payer: BC Managed Care – PPO | Admitting: Infectious Diseases

## 2020-10-06 ENCOUNTER — Other Ambulatory Visit: Payer: Self-pay

## 2020-10-06 VITALS — Ht 61.0 in | Wt 132.0 lb

## 2020-10-06 DIAGNOSIS — F319 Bipolar disorder, unspecified: Secondary | ICD-10-CM

## 2020-10-06 DIAGNOSIS — Z113 Encounter for screening for infections with a predominantly sexual mode of transmission: Secondary | ICD-10-CM

## 2020-10-06 DIAGNOSIS — N870 Mild cervical dysplasia: Secondary | ICD-10-CM

## 2020-10-06 DIAGNOSIS — Z79899 Other long term (current) drug therapy: Secondary | ICD-10-CM

## 2020-10-06 DIAGNOSIS — B20 Human immunodeficiency virus [HIV] disease: Secondary | ICD-10-CM | POA: Diagnosis not present

## 2020-10-06 MED ORDER — ONDANSETRON HCL 4 MG PO TABS: 4.0000 mg | ORAL_TABLET | Freq: Three times a day (TID) | ORAL | 3 refills | Status: DC | PRN

## 2020-10-06 MED ORDER — EMTRICITAB-RILPIVIR-TENOFOV AF 200-25-25 MG PO TABS
1.0000 | ORAL_TABLET | Freq: Every day | ORAL | 3 refills | Status: DC
Start: 2020-10-06 — End: 2021-10-28

## 2020-10-06 NOTE — Assessment & Plan Note (Signed)
Will refer to PAP clinic

## 2020-10-06 NOTE — Assessment & Plan Note (Signed)
Will get her back in with psych.

## 2020-10-06 NOTE — Assessment & Plan Note (Addendum)
She is doing well Offered/refused condoms.  She asks where she can get COVID vax She consents to [place]. Offer menveo (refuses) Will refill ART, odefsey rtc in 9 months Will send info to pharm for possible [place] when insurance covers.

## 2020-10-06 NOTE — Progress Notes (Signed)
   Subjective:    Patient ID: Laura Rogers, female  DOB: 1976/03/12, 45 y.o.        MRN: [id number]   HPI 45yo F with HIV+, depression. Also with hx of cluster headaches.  Genvoya --> odefsy -->[place]/descovy --> off--> biktravy. At her visit (12-2018) she was changed back to [facility]. Each of them made her nauseous.   Also hx ofCIN1, had colpo 04-2015. Negative.  Had PAP negative 11-2015.Needs apt for repeat.  Has been seeing psych- for biploar, taking riplan and xanax  She continues to have nausea. Takes her ART in the afternoon and she attributes her nausea to her ART.  She had COVID at last visit.  Unvax.  still haveing nausea, headaches. Smoking THC which is not helping. Had been off ART, which she attributes worsening of nausea to.    HIV 1 RNA Quant  Date Value  09/29/2020 <20 Copies/mL (H)  02/20/2019 <20 DETECTED copies/mL (A)  11/29/2018 19,800 copies/mL (H)   CD4 T Cell Abs (/uL)  Date Value  09/29/2020 421  02/20/2019 325 (L)  11/29/2018 290 (L)     Health Maintenance  Topic Date Due  . COVID-19 Vaccine (1) Never done  . TETANUS/TDAP  Never done  . PAP SMEAR-Modifier  12/11/2018  . INFLUENZA VACCINE  04/12/2020  . Hepatitis C Screening  Completed  . HIV Screening  Completed      Review of Systems  Constitutional: Positive for weight loss. Negative for chills and fever.  Respiratory: Negative for cough and shortness of breath.   Gastrointestinal: Negative for abdominal pain and diarrhea.  Genitourinary: Negative for dysuria.   Please see HPI. All other systems reviewed and negative.     Objective:  Physical Exam Constitutional:      Appearance: She is normal weight.  HENT:     Mouth/Throat:     Mouth: Mucous membranes are moist.     Pharynx: Oropharynx is clear.  Eyes:     Extraocular Movements: Extraocular movements intact.     Pupils: Pupils are equal, round, and reactive to light.  Cardiovascular:     Rate and Rhythm: Normal  rate and regular rhythm.  Pulmonary:     Effort: Pulmonary effort is normal.     Breath sounds: Normal breath sounds.  Abdominal:     General: Bowel sounds are normal. There is no distension.     Palpations: Abdomen is soft.     Tenderness: There is no abdominal tenderness.  Musculoskeletal:        General: Normal range of motion.     Cervical back: Normal range of motion and neck supple.     Right lower leg: No edema.     Left lower leg: No edema.  Neurological:     General: No focal deficit present.     Mental Status: She is alert.  Psychiatric:        Mood and Affect: Mood normal.            Assessment & Plan:

## 2020-10-27 ENCOUNTER — Ambulatory Visit: Payer: BC Managed Care – PPO | Admitting: Infectious Diseases

## 2020-10-29 ENCOUNTER — Ambulatory Visit (INDEPENDENT_AMBULATORY_CARE_PROVIDER_SITE_OTHER): Payer: BC Managed Care – PPO | Admitting: Physician Assistant

## 2020-10-29 ENCOUNTER — Other Ambulatory Visit: Payer: Self-pay

## 2020-10-29 ENCOUNTER — Encounter: Payer: Self-pay | Admitting: Physician Assistant

## 2020-10-29 DIAGNOSIS — F1011 Alcohol abuse, in remission: Secondary | ICD-10-CM | POA: Diagnosis not present

## 2020-10-29 DIAGNOSIS — F5105 Insomnia due to other mental disorder: Secondary | ICD-10-CM

## 2020-10-29 DIAGNOSIS — F319 Bipolar disorder, unspecified: Secondary | ICD-10-CM | POA: Diagnosis not present

## 2020-10-29 DIAGNOSIS — F411 Generalized anxiety disorder: Secondary | ICD-10-CM

## 2020-10-29 DIAGNOSIS — F99 Mental disorder, not otherwise specified: Secondary | ICD-10-CM

## 2020-10-29 MED ORDER — RISPERIDONE 4 MG PO TABS: 4.0000 mg | ORAL_TABLET | Freq: Every day | ORAL | 1 refills | Status: DC

## 2020-10-29 MED ORDER — TRAZODONE HCL 100 MG PO TABS: 100.0000 mg | ORAL_TABLET | Freq: Every day | ORAL | 1 refills | Status: DC

## 2020-10-29 MED ORDER — BUSPIRONE HCL 15 MG PO TABS: 15.0000 mg | ORAL_TABLET | Freq: Two times a day (BID) | ORAL | 1 refills | Status: DC

## 2020-10-29 MED ORDER — ALPRAZOLAM 0.5 MG PO TABS: 0.5000 mg | ORAL_TABLET | Freq: Two times a day (BID) | ORAL | 1 refills | Status: DC | PRN

## 2020-10-29 NOTE — Progress Notes (Signed)
[facility]  Patient ID: Laura Rogers,  MRN: [id number]  PCP: No primary care provider on file.  Date of Evaluation: 10/29/2020 Time spent:30 minutes  Chief Complaint:  Chief Complaint    Anxiety; Insomnia; Depression      HISTORY/CURRENT STATUS: HPI For routine f/u.  Under a lot of stress, brother in law had a hemorrhagic stroke 2 days ago.  Hydroxyzine not effective to help with the anxiety she is feeling.  She feels shaky inside, not sleeping well, difficulty falling asleep due to racing thoughts.  She has been panicky several times in the past few days, unable to catch her breath, feels like her chest is being squeezed and she cannot get a good deep breath, she has been crying more.  Up until this happened she was doing better.  Able to enjoy things.  Energy and motivation have been good.  She is back at work and although it is stressful, it is going okay.  Not isolating.  She and her boyfriend are working things out so not having as much tension in their relationship.  No alcohol intake at all.  Denies suicidal or homicidal thoughts.  Patient denies increased energy with decreased need for sleep, no increased talkativeness, no racing thoughts, no impulsivity or risky behaviors, no increased spending, no increased libido, no grandiosity, no increased irritability or anger, no paranoia, and no hallucinations.  Denies dizziness, syncope, seizures, numbness, tingling, tremor, tics, unsteady gait, slurred speech, confusion. Denies muscle or joint pain, stiffness, or dystonia.  Individual Medical History/ Review of Systems: Changes? :Yes   had COVID last month.  Past medications for mental health diagnoses include: Risperdal, Zoloft, Restoril, trazodone, Xanax  Allergies: Levofloxacin, Valacyclovir hcl, Sulfamethoxazole-trimethoprim, Levofloxacin, Penicillins, and Bactrim  Current Medications:  Current Outpatient Medications:  .  acamprosate (CAMPRAL) 333 MG tablet,  Take 2 tablets (666 mg total) by mouth 3 (three) times daily with meals., Disp: 180 tablet, Rfl: 1 .  acyclovir ointment (ZOVIRAX) 5 %, Apply 1 application topically every 3 (three) hours., Disp: 15 g, Rfl: 0 .  ALPRAZolam (XANAX) 0.5 MG tablet, Take 1 tablet (0.5 mg total) by mouth 2 (two) times daily as needed for anxiety., Disp: 60 tablet, Rfl: 1 .  benzonatate (TESSALON PERLES) 100 MG capsule, Take 1 capsule (100 mg total) by mouth 3 (three) times daily as needed for cough., Disp: 20 capsule, Rfl: 0 .  emtricitabine-rilpivir-tenofovir AF (ODEFSEY) 200-25-25 MG TABS tablet, Take 1 tablet by mouth daily with breakfast. Try to take at the same time each day with a full meal, Disp: 90 tablet, Rfl: 3 .  ondansetron (ZOFRAN) 4 MG tablet, Take 1 tablet (4 mg total) by mouth every 8 (eight) hours as needed for nausea or vomiting., Disp: 30 tablet, Rfl: 3 .  risperidone (RISPERDAL) 4 MG tablet, Take 1 tablet (4 mg total) by mouth daily., Disp: 30 tablet, Rfl: 1 .  thiamine 100 MG tablet, Take 1 tablet (100 mg total) by mouth daily., Disp: 30 tablet, Rfl: 11 .  busPIRone (BUSPAR) 15 MG tablet, Take 1 tablet (15 mg total) by mouth 2 (two) times daily., Disp: 180 tablet, Rfl: 1 .  naproxen (NAPROSYN) 375 MG tablet, Take 1 tablet (375 mg total) by mouth 2 (two) times daily as needed. (Patient not taking: No sig reported), Disp: 20 tablet, Rfl: 0 .  traZODone (DESYREL) 100 MG tablet, Take 1-2 tablets (100-200 mg total) by mouth at bedtime., Disp: 180 tablet, Rfl: 1 Medication Side Effects: none  Family Medical/ Social History: Changes? Yes. Brother-in-law 27 years old had massive stroke a few days ago.    MENTAL HEALTH EXAM:  There were no vitals taken for this visit.There is no height or weight on file to calculate BMI.  General Appearance: Casual, Neat and Well Groomed  Eye Contact:  Good  Speech:  Clear and Coherent and Normal Rate  Volume:  Normal  Mood:  Anxious and Depressed  Affect:  Depressed  and Anxious  Thought Process:  Goal Directed and Descriptions of Associations: Intact  Orientation:  Full (Time, Place, and Person)  Thought Content: Logical   Suicidal Thoughts:  No  Homicidal Thoughts:  No  Memory:  WNL  Judgement:  Good  Insight:  Good  Psychomotor Activity:  Normal  Concentration:  Concentration: Good and Attention Span: Good  Recall:  Good  Fund of Knowledge: Good  Language: Good  Assets:  Desire for Improvement  ADL's:  Intact  Cognition: WNL  Prognosis:  Good    DIAGNOSES:    ICD-10-CM   1. Generalized anxiety disorder  F41.1   2. Bipolar I disorder ([place])  F31.9   3. Insomnia due to other mental disorder  F51.05    F99   4. History of alcohol abuse  F10.11     Receiving Psychotherapy: No     RECOMMENDATIONS:  PDMP was reviewed. I provided 30 minutes of face-to-face time during this encounter, reviewing previous progress notes and discussing the anxiety and treatment options with her.  She has not been drinking alcohol for the past 6 months or so.  She is taking the acamprosate as prescribed and I believe her to be honest.  I will give her a few Xanax to help with this acute anxiety.  She understands it is not going to be a long-term solution though.  Recommend increasing the Risperdal to help with anxiety as well as mood disorder long-term. Continue Campral 333 mg, 2 p.o. 3 times daily. Restart Xanax 0.5 mg, 1 p.o. twice daily, use sparingly. Continue BuSpar 15 mg, 1 p.o. twice daily. Continue hydroxyzine 25 mg, 1/2-1 p.o. 3 times daily as needed anxiety. Increase Risperdal to 4 mg, 1 p.o. nightly. Continue thiamine 100 mg, 1 p.o. daily. Continue trazodone 100 mg, 1-2 p.o. nightly as needed sleep. Recommend counseling. Return in 6 weeks.   Donnal Moat, PA-C

## 2020-12-15 ENCOUNTER — Other Ambulatory Visit: Payer: Self-pay | Admitting: Physician Assistant

## 2020-12-23 ENCOUNTER — Ambulatory Visit: Payer: Medicare Other | Admitting: Physician Assistant

## 2020-12-24 ENCOUNTER — Ambulatory Visit (INDEPENDENT_AMBULATORY_CARE_PROVIDER_SITE_OTHER): Payer: BC Managed Care – PPO | Admitting: Physician Assistant

## 2020-12-24 ENCOUNTER — Encounter: Payer: Self-pay | Admitting: Physician Assistant

## 2020-12-24 ENCOUNTER — Other Ambulatory Visit: Payer: Self-pay

## 2020-12-24 VITALS — BP 125/87 | HR 77

## 2020-12-24 DIAGNOSIS — F1011 Alcohol abuse, in remission: Secondary | ICD-10-CM

## 2020-12-24 DIAGNOSIS — F99 Mental disorder, not otherwise specified: Secondary | ICD-10-CM

## 2020-12-24 DIAGNOSIS — F319 Bipolar disorder, unspecified: Secondary | ICD-10-CM | POA: Diagnosis not present

## 2020-12-24 DIAGNOSIS — F411 Generalized anxiety disorder: Secondary | ICD-10-CM

## 2020-12-24 DIAGNOSIS — B20 Human immunodeficiency virus [HIV] disease: Secondary | ICD-10-CM | POA: Diagnosis not present

## 2020-12-24 DIAGNOSIS — F5105 Insomnia due to other mental disorder: Secondary | ICD-10-CM

## 2020-12-24 MED ORDER — DULOXETINE HCL 60 MG PO CPEP: 60.0000 mg | ORAL_CAPSULE | Freq: Every day | ORAL | 1 refills | Status: DC

## 2020-12-24 MED ORDER — BUSPIRONE HCL 15 MG PO TABS: 15.0000 mg | ORAL_TABLET | Freq: Three times a day (TID) | ORAL | 1 refills | Status: DC

## 2020-12-24 MED ORDER — DULOXETINE HCL 30 MG PO CPEP: 30.0000 mg | ORAL_CAPSULE | Freq: Every day | ORAL | 0 refills | Status: DC

## 2020-12-24 MED ORDER — PROPRANOLOL HCL 10 MG PO TABS: 10.0000 mg | ORAL_TABLET | Freq: Three times a day (TID) | ORAL | 1 refills | Status: DC | PRN

## 2020-12-24 NOTE — Progress Notes (Signed)
[facility]  Patient ID: Laura Rogers,  MRN: [id number]  PCP: Campbell Riches, MD  Date of Evaluation: 12/24/2020 Time spent:40 minutes  Chief Complaint:  Chief Complaint    Anxiety; Depression; Insomnia      HISTORY/CURRENT STATUS: HPI Not doing well.  Crying all the time. Under a lot of stress. Isolating for several reasons, covid, has HIV and does't want to be around anybody else. She had covid in Jan. Work is very stressful, she's looking for a new job. Denies any alcohol intake for months, but smokes weed. "It calms me." Anxious all the time, panic attacks happen daily. Gets sweaty, has palpitations, gets irritable when she doesn't take the Xanax. Her Dad and boyfriend have told her about the increased irritability.  She feels that it is more related to anxiety than anything else.  Trouble falling asleep still.  Racing thoughts.  Feels sad a lot, ADLs are normal.  Denies suicidal or homicidal thoughts.  Patient denies increased energy with decreased need for sleep, no increased talkativeness, no impulsivity or risky behaviors, no increased spending, no increased libido, no grandiosity, and no hallucinations.  Denies dizziness, syncope, seizures, numbness, tingling, tremor, tics, unsteady gait, slurred speech, confusion. Denies muscle or joint pain, stiffness, or dystonia. Denies unexplained weight loss, frequent infections, or sores that heal slowly.  No polyphagia, polydipsia, or polyuria. Denies visual changes or paresthesias.   Individual Medical History/ Review of Systems: Changes? :No   Past medications for mental health diagnoses include: Risperdal, Zoloft, Restoril, trazodone, Xanax  Allergies: Levofloxacin, Valacyclovir hcl, Sulfamethoxazole-trimethoprim, Levofloxacin, Penicillins, and Bactrim  Current Medications:  Current Outpatient Medications:  .  acyclovir ointment (ZOVIRAX) 5 %, Apply 1 application topically every 3 (three) hours., Disp: 15 g,  Rfl: 0 .  ALPRAZolam (XANAX) 0.5 MG tablet, Take 1 tablet (0.5 mg total) by mouth 2 (two) times daily as needed for anxiety., Disp: 60 tablet, Rfl: 1 .  benzonatate (TESSALON PERLES) 100 MG capsule, Take 1 capsule (100 mg total) by mouth 3 (three) times daily as needed for cough., Disp: 20 capsule, Rfl: 0 .  DULoxetine (CYMBALTA) 30 MG capsule, Take 1 capsule (30 mg total) by mouth daily., Disp: 14 capsule, Rfl: 0 .  DULoxetine (CYMBALTA) 60 MG capsule, Take 1 capsule (60 mg total) by mouth daily. Begin this after taking the Cymbalta 30 mg for 2 weeks., Disp: 30 capsule, Rfl: 1 .  emtricitabine-rilpivir-tenofovir AF (ODEFSEY) 200-25-25 MG TABS tablet, Take 1 tablet by mouth daily with breakfast. Try to take at the same time each day with a full meal, Disp: 90 tablet, Rfl: 3 .  ondansetron (ZOFRAN) 4 MG tablet, Take 1 tablet (4 mg total) by mouth every 8 (eight) hours as needed for nausea or vomiting., Disp: 30 tablet, Rfl: 3 .  propranolol (INDERAL) 10 MG tablet, Take 1-2 tablets (10-20 mg total) by mouth 3 (three) times daily as needed., Disp: 60 tablet, Rfl: 1 .  risperidone (RISPERDAL) 4 MG tablet, TAKE 1 TABLET(4 MG) BY MOUTH DAILY, Disp: 30 tablet, Rfl: 0 .  traZODone (DESYREL) 100 MG tablet, Take 1-2 tablets (100-200 mg total) by mouth at bedtime., Disp: 180 tablet, Rfl: 1 .  busPIRone (BUSPAR) 15 MG tablet, Take 1 tablet (15 mg total) by mouth 3 (three) times daily., Disp: 270 tablet, Rfl: 1 .  naproxen (NAPROSYN) 375 MG tablet, Take 1 tablet (375 mg total) by mouth 2 (two) times daily as needed. (Patient not taking: No sig reported), Disp: 20 tablet, Rfl:  0 Medication Side Effects: none  Family Medical/ Social History: Changes? No  MENTAL HEALTH EXAM:  Blood pressure 125/87, pulse 77.There is no height or weight on file to calculate BMI.  General Appearance: Casual, Guarded and Well Groomed  Eye Contact:  Good  Speech:  Clear and Coherent and Normal Rate  Volume:  Normal  Mood:   Anxious and Depressed  Affect:  Anxious  Thought Process:  Goal Directed and Descriptions of Associations: Intact  Orientation:  Full (Time, Place, and Person)  Thought Content: Logical   Suicidal Thoughts:  No  Homicidal Thoughts:  No  Memory:  WNL  Judgement:  Good  Insight:  Good  Psychomotor Activity:  Normal  Concentration:  Concentration: Good  Recall:  Good  Fund of Knowledge: Good  Language: Good  Assets:  Desire for Improvement  ADL's:  Intact  Cognition: WNL  Prognosis:  Good    DIAGNOSES:    ICD-10-CM   1. Bipolar I disorder ([place])  F31.9   2. Generalized anxiety disorder  F41.1   3. History of alcohol abuse  F10.11   4. HIV disease ([place])  B20   5. Insomnia due to other mental disorder  F51.05    F99     Receiving Psychotherapy: No    RECOMMENDATIONS:  PDMP was reviewed. I provided 40 minutes of face-to-face time during this encounter, including time spent before and after the visit and records review and charting. Recommend adding propranolol for as needed use for the anxiety.  Again our goal is for her to not need the Xanax at all or at least not as much.  We discussed the benefits and side effects and she accepts.  She knows to contact our office if she has any dizziness or feels faint. Recommend adding Cymbalta to help with the anxiety and depression.  Benefits, risks and side effects were discussed. Continue Xanax 0.5 mg, 1 p.o. twice daily. Start Propranolol 10 mg, 1-2 tid prn. Increase Buspar 15 mg to tid.  Start Cymbalta 30 mg daily for 2 weeks.  Then increase to Cymbalta 60 mg p.o. daily. Continue Risperdal 4 mg p.o. nightly. Continue trazodone 100 mg, 1-2 p.o. nightly as needed sleep. Recommend counseling. Return in 6 to 8 weeks.  Donnal Moat, PA-C

## 2021-01-02 DIAGNOSIS — F411 Generalized anxiety disorder: Secondary | ICD-10-CM | POA: Insufficient documentation

## 2021-01-04 ENCOUNTER — Other Ambulatory Visit: Payer: Self-pay | Admitting: Physician Assistant

## 2021-01-20 ENCOUNTER — Other Ambulatory Visit: Payer: Self-pay | Admitting: Physician Assistant

## 2021-01-22 ENCOUNTER — Other Ambulatory Visit: Payer: Self-pay | Admitting: Physician Assistant

## 2021-01-22 NOTE — Telephone Encounter (Signed)
Last refill 4/04 Sees [facility] 6/21

## 2021-02-04 ENCOUNTER — Ambulatory Visit (INDEPENDENT_AMBULATORY_CARE_PROVIDER_SITE_OTHER): Payer: BC Managed Care – PPO | Admitting: Infectious Diseases

## 2021-02-04 ENCOUNTER — Other Ambulatory Visit: Payer: Self-pay

## 2021-02-04 ENCOUNTER — Encounter: Payer: Self-pay | Admitting: Infectious Diseases

## 2021-02-04 DIAGNOSIS — K5900 Constipation, unspecified: Secondary | ICD-10-CM | POA: Diagnosis not present

## 2021-02-04 MED ORDER — FLEET ENEMA 7-19 GM/118ML RE ENEM: 1.0000 | ENEMA | Freq: Once | RECTAL | 0 refills | Status: AC

## 2021-02-04 MED ORDER — MAGNESIUM CITRATE PO SOLN: 296.0000 mL | Freq: Once | ORAL | 0 refills | Status: AC

## 2021-02-04 NOTE — Assessment & Plan Note (Signed)
Will give her OTC mag sulfate and fleets enema. I described how to take each of these respectively.  I offered her to be eval in ED for plain films and enema but she wishes to try OTC at home first.  I have asked her to call us tomorrow if not feeling better.

## 2021-02-04 NOTE — Progress Notes (Signed)
   Subjective:    Patient ID: Laura Rogers, female  DOB: 15-Mar-1976, 44 y.o.        MRN: [id number]   HPI 45yo F with HIV+, depression. Also with hx of cluster headaches.  Genvoya --> odefsy -->Tivicay/descovy --> off--> biktravy.At her visit (12-2018) she was changed back to [facility].Each of them made her nauseous.   Also hx ofCIN1, had colpo 04-2015. Negative.  Had PAP negative 11-2015.Needs apt for repeat.  Has been seeing psych- for biploar, taking riplan and xanax  Today she states "that nothing is coming out". Has not had BM for 4 days. Has tried equate [employer] stool softerner and laxative. Having stomach pain as well. Scared to eat. Has emesis with liquids (although was able to drink a ginger ale o nher way here and it has stayed down).  Had vaginal sex and this also gave her severe abd pain. She took 2 gas pills which did not help (green ?) Not passing gas.  Had 1 small "pebble" bm just prior to arrival which caused emesis as well . Normal urination.   HIV 1 RNA Quant  Date Value  09/29/2020 <20 Copies/mL (H)  02/20/2019 <20 DETECTED copies/mL (A)  11/29/2018 19,800 copies/mL (H)   CD4 T Cell Abs (/uL)  Date Value  09/29/2020 421  02/20/2019 325 (L)  11/29/2018 290 (L)     Health Maintenance  Topic Date Due  . COVID-19 Vaccine (1) Never done  . TETANUS/TDAP  Never done  . PAP SMEAR-Modifier  12/11/2018  . INFLUENZA VACCINE  04/12/2021  . Zoster Vaccines- Shingrix (1 of 2) 12/08/2026  . Hepatitis C Screening  Completed  . HIV Screening  Completed  . HPV VACCINES  Aged Out    Review of Systems  Constitutional: Positive for diaphoresis. Negative for chills and fever.  Respiratory: Negative for cough and shortness of breath.   Gastrointestinal: Positive for constipation, nausea and vomiting. Negative for diarrhea.  Genitourinary: Negative for dysuria.    Please see HPI. All other systems reviewed and negative.     Objective:  Physical Exam Vitals  reviewed.  Constitutional:      General: She is not in acute distress.    Appearance: She is well-developed. She is not ill-appearing.  Eyes:     Extraocular Movements: Extraocular movements intact.     Pupils: Pupils are equal, round, and reactive to light.  Cardiovascular:     Rate and Rhythm: Normal rate and regular rhythm.     Heart sounds: Normal heart sounds.  Pulmonary:     Effort: Pulmonary effort is normal.     Breath sounds: Normal breath sounds.  Abdominal:     General: Abdomen is flat. There is no distension.     Palpations: Abdomen is soft.     Tenderness: There is abdominal tenderness (she has mild- mod supra-pubic tenderness with mild refelx to grab my hands on exam. she has miniaml if any distension. she refuses rectal exam. ) in the suprapubic area. There is no guarding. Negative signs include psoas sign.  Neurological:     Mental Status: She is alert.            Assessment & Plan:

## 2021-03-02 ENCOUNTER — Ambulatory Visit: Payer: Medicare Other | Admitting: Physician Assistant

## 2021-03-03 DIAGNOSIS — Z20828 Contact with and (suspected) exposure to other viral communicable diseases: Secondary | ICD-10-CM | POA: Diagnosis not present

## 2021-03-05 ENCOUNTER — Telehealth: Payer: Self-pay

## 2021-03-05 ENCOUNTER — Other Ambulatory Visit: Payer: Self-pay | Admitting: Family

## 2021-03-05 MED ORDER — MOLNUPIRAVIR EUA 200MG CAPSULE: 4.0000 | ORAL_CAPSULE | Freq: Two times a day (BID) | ORAL | 0 refills | Status: AC

## 2021-03-05 NOTE — Telephone Encounter (Signed)
Patient returned phone call regarding her COVID symptoms. Patient states her symptoms began 03/01/21 and she received positive results on 03/04/21. Patient complains of headaches, sore throat and congestion. Patient stated no fevers as of today. Patient has been taking tessalon pearls for the cough and was taking leftover azithromycin, which she was advised to stop taking. Patient would like an antiviral medication if she is a candidate for this. She has been advised that one of our providers will need to review her chart first before prescribing. Patient verbalized understanding. [name]

## 2021-03-05 NOTE — Telephone Encounter (Signed)
Patient informed and will pick rx up from the pharmacy. Laura Rogers T Brooks Sailors

## 2021-03-05 NOTE — Telephone Encounter (Signed)
Received message via scheduling request from patient stating she tested positive for COVID and is feeling very weak and has a fever. Called patient, no answer. Left HIPAA compliant voicemail requesting callback.   Beryle Flock, RN

## 2021-03-05 NOTE — Telephone Encounter (Signed)
[facility] [address] which is [facility] closest to her home.

## 2021-03-11 ENCOUNTER — Telehealth: Payer: Self-pay

## 2021-03-11 NOTE — Telephone Encounter (Signed)
Patient called to report continued symptoms of COVID after completing her molnupiravir on Tuesday. She reports continued headaches and fevers (Tmax 100.4). Is taking tylenol with some relief. RN instructed her to continue to remaiin hydrated and increase fluids due to temperature fluctuations and possible dehydration. Patient does have some congestion and coughing.   Forwarding to provider for any additional feedback.  Dracen Reigle Lorita Officer, RN

## 2021-04-23 ENCOUNTER — Telehealth: Payer: Self-pay | Admitting: Physician Assistant

## 2021-04-23 ENCOUNTER — Other Ambulatory Visit: Payer: Self-pay

## 2021-04-23 ENCOUNTER — Encounter: Payer: Self-pay | Admitting: Physician Assistant

## 2021-04-23 ENCOUNTER — Ambulatory Visit (INDEPENDENT_AMBULATORY_CARE_PROVIDER_SITE_OTHER): Payer: BC Managed Care – PPO | Admitting: Physician Assistant

## 2021-04-23 DIAGNOSIS — F5105 Insomnia due to other mental disorder: Secondary | ICD-10-CM | POA: Diagnosis not present

## 2021-04-23 DIAGNOSIS — F411 Generalized anxiety disorder: Secondary | ICD-10-CM | POA: Diagnosis not present

## 2021-04-23 DIAGNOSIS — F99 Mental disorder, not otherwise specified: Secondary | ICD-10-CM | POA: Diagnosis not present

## 2021-04-23 DIAGNOSIS — F319 Bipolar disorder, unspecified: Secondary | ICD-10-CM

## 2021-04-23 MED ORDER — ALPRAZOLAM 1 MG PO TABS: 1.0000 mg | ORAL_TABLET | Freq: Two times a day (BID) | ORAL | 2 refills | Status: DC | PRN

## 2021-04-23 MED ORDER — TRAZODONE HCL 100 MG PO TABS: 100.0000 mg | ORAL_TABLET | Freq: Every day | ORAL | 1 refills | Status: DC

## 2021-04-23 MED ORDER — RISPERIDONE 4 MG PO TABS: ORAL_TABLET | ORAL | 1 refills | Status: DC

## 2021-04-23 NOTE — Progress Notes (Signed)
[facility]  Patient ID: Laura Rogers,  MRN: [id number]  PCP: Campbell Riches, MD  Date of Evaluation: 04/23/2021  time spent:45 minutes  Chief Complaint:  Chief Complaint   Follow-up; Anxiety; Depression     HISTORY/CURRENT STATUS: HPI For routine med check, very stressed.  Her brother that had a stroke tried to commit suicide.  He is not eating well at all and she is worried to death about him.  Works overtime all the time. 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 Boyfriend not working now and she's having to pay for everything. Rent went up $400. She's getting more fever blisters.  She is having more generalized anxiety than panic attacks, and the Xanax does help.  Had Covid last month and is still tired and having some brain fog from it.  States that lab values are a little worse as far as the HIV goes.  No change in treatment though.  She has a hard time enjoying things.  Energy and motivation are low, especially since she had COVID.  Trouble focusing, again since [place].  Stress also makes it worse.  Not getting restful sleep.  No suicidal or homicidal thoughts.  Denies dizziness, syncope, seizures, numbness, tingling, tremor, tics, unsteady gait, slurred speech, confusion. Denies muscle or joint pain, stiffness, or dystonia. Denies unexplained weight loss, frequent infections, or sores that heal slowly.  No polyphagia, polydipsia, or polyuria. Denies visual changes or paresthesias.   Individual Medical History/ Review of Systems: Changes? :Yes   see HPI  Past medications for mental health diagnoses include: Risperdal, Zoloft, Restoril, trazodone, Xanax, BuSpar was not effective, Cymbalta ineffective, propranolol ineffective.  Allergies: Levofloxacin, Valacyclovir hcl, Sulfamethoxazole-trimethoprim, Levofloxacin, Penicillins, and Bactrim  Current Medications:  Current Outpatient Medications:     acyclovir ointment (ZOVIRAX) 5 %, Apply 1 application topically every 3 (three) hours., Disp: 15 g, Rfl: 0   ALPRAZolam (XANAX) 1 MG tablet, Take 1 tablet (1 mg total) by mouth 2 (two) times daily as needed for anxiety., Disp: 60 tablet, Rfl: 2   emtricitabine-rilpivir-tenofovir AF (ODEFSEY) 200-25-25 MG TABS tablet, Take 1 tablet by mouth daily with breakfast. Try to take at the same time each day with a full meal, Disp: 90 tablet, Rfl: 3   ondansetron (ZOFRAN) 4 MG tablet, Take 1 tablet (4 mg total) by mouth every 8 (eight) hours as needed for nausea or vomiting., Disp: 30 tablet, Rfl: 3   benzonatate (TESSALON PERLES) 100 MG capsule, Take 1 capsule (100 mg total) by mouth 3 (three) times daily as needed for cough. (Patient not taking: Reported on 04/23/2021), Disp: 20 capsule, Rfl: 0   propranolol (INDERAL) 10 MG tablet, Take 1-2 tablets (10-20 mg total) by mouth 3 (three) times daily as needed. (Patient not taking: Reported on 04/23/2021), Disp: 60 tablet, Rfl: 1   risperidone (RISPERDAL) 4 MG tablet, TAKE 1 TABLET(4 MG) BY MOUTH DAILY, Disp: 90 tablet, Rfl: 1   traZODone (DESYREL) 100 MG tablet, Take 1-2 tablets (100-200 mg total) by mouth at bedtime., Disp: 180 tablet, Rfl: 1 Medication Side Effects: none  Family Medical/ Social History: Changes?  See HPI.   MENTAL HEALTH EXAM:  There were no vitals taken for this visit.There is no height or weight on file to calculate BMI.  General Appearance: Casual, Guarded and Well Groomed  Eye Contact:  Good  Speech:  Clear and Coherent and Normal Rate  Volume:  Normal  Mood:  Anxious and Depressed  Affect:  Anxious  Thought Process:  Goal Directed and Descriptions of Associations: Circumstantial  Orientation:  Full (Time, Place, and Person)  Thought Content: Logical   Suicidal Thoughts:  No  Homicidal Thoughts:  No  Memory:  WNL  Judgement:  Good  Insight:  Good  Psychomotor Activity:  Normal  Concentration:  Concentration: Good  Recall:   Good  Fund of Knowledge: Good  Language: Good  Assets:  Desire for Improvement  ADL's:  Intact  Cognition: WNL  Prognosis:  Good    DIAGNOSES:    ICD-10-CM   1. Bipolar I disorder ([place])  F31.9     2. Generalized anxiety disorder  F41.1     3. Insomnia due to other mental disorder  F51.05    F99        Receiving Psychotherapy: No    RECOMMENDATIONS:  PDMP was reviewed.  Last Xanax filled 01/22/2021. I provided 45 minutes of face to face time during this encounter, including time spent before and after the visit in records review, medical decision making, and charting.  We discussed the situation.  Anxiety is not controlled.  She did not feel that the BuSpar helped at all.  Neither did Cymbalta so she stopped it.  She is not drinking any alcohol at all now so I think increasing the Xanax would be helpful. Increase Xanax to 1 mg, one half half to 1 p.o. twice daily as needed.   Continue Risperdal 4 mg p.o. nightly. Continue trazodone 100 mg, 1-2 p.o. nightly as needed sleep. When intermittent FMLA needs to be filled out once again, 3 days per month Intermittent FMLA. 8 hours each time. And 2 appts at 4 hours each per month. Recommend counseling. Return in 6 to 8 weeks.  Donnal Moat, PA-C

## 2021-04-23 NOTE — Telephone Encounter (Signed)
Received fax from [employer] for completion of FMLA form. Placed on Traci's desk.

## 2021-05-03 DIAGNOSIS — Z0289 Encounter for other administrative examinations: Secondary | ICD-10-CM

## 2021-05-14 ENCOUNTER — Telehealth: Payer: Self-pay | Admitting: Physician Assistant

## 2021-05-14 ENCOUNTER — Other Ambulatory Visit: Payer: Self-pay | Admitting: Physician Assistant

## 2021-05-14 NOTE — Telephone Encounter (Signed)
I have added the pharmacy to her list for you.She will be there for another week until 9/9

## 2021-05-14 NOTE — Telephone Encounter (Signed)
Please review

## 2021-05-14 NOTE — Telephone Encounter (Signed)
In the future, please call the patient for more info, how long is she going to be in [place]? Also confirm the pharmacy, I tried several different ways to find the pharmacy and I was not able to pull anything up. Let her know I am unable to send the Xanax.  I can send enough Risperdal to get her through until she gets home. Thanks

## 2021-05-14 NOTE — Telephone Encounter (Signed)
Patient called in stating that she has been staying in [place] for the past five days and has been without her medicine. She cant concentrate and really needs her medicine. She would like prescription for Risperidone 29m [facility] [address] [phone]. Appt 9/30.

## 2021-05-15 ENCOUNTER — Other Ambulatory Visit: Payer: Self-pay | Admitting: Physician Assistant

## 2021-05-15 MED ORDER — RISPERIDONE 4 MG PO TABS: 4.0000 mg | ORAL_TABLET | Freq: Every day | ORAL | 0 refills | Status: DC

## 2021-05-15 NOTE — Telephone Encounter (Signed)
Thanks, I couldn't find it in [place] as the message said.

## 2021-05-19 ENCOUNTER — Telehealth: Payer: Self-pay

## 2021-05-19 NOTE — Telephone Encounter (Signed)
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 Asked that she fax required forms to our office for Dr. Johnnye Sima to review.   Beryle Flock, RN

## 2021-05-25 ENCOUNTER — Other Ambulatory Visit: Payer: Self-pay

## 2021-05-25 ENCOUNTER — Encounter: Payer: Self-pay | Admitting: Physician Assistant

## 2021-05-25 ENCOUNTER — Ambulatory Visit (INDEPENDENT_AMBULATORY_CARE_PROVIDER_SITE_OTHER): Payer: BC Managed Care – PPO | Admitting: Physician Assistant

## 2021-05-25 DIAGNOSIS — F5105 Insomnia due to other mental disorder: Secondary | ICD-10-CM

## 2021-05-25 DIAGNOSIS — F319 Bipolar disorder, unspecified: Secondary | ICD-10-CM | POA: Diagnosis not present

## 2021-05-25 DIAGNOSIS — B20 Human immunodeficiency virus [HIV] disease: Secondary | ICD-10-CM

## 2021-05-25 DIAGNOSIS — F99 Mental disorder, not otherwise specified: Secondary | ICD-10-CM

## 2021-05-25 DIAGNOSIS — F411 Generalized anxiety disorder: Secondary | ICD-10-CM | POA: Diagnosis not present

## 2021-05-25 NOTE — Progress Notes (Signed)
[facility]  Patient ID: Laura Rogers,  MRN: [id number]  PCP: Campbell Riches, MD  Date of Evaluation: 05/25/2021 time spent:30 minutes  Chief Complaint:  Chief Complaint   Anxiety; Depression; Insomnia; Follow-up      HISTORY/CURRENT STATUS: HPI For routine med check, very stressed.  Had to go to [place] because of a death in the family. Had been without her meds for a few days. Then when she got back, HR was giving her a really hard time saying the FMLA wasn't correct, or wasn't approved, she kept getting the run around, she got very frustrated about it all, and as she was hanging up the phone, she muttered under her breath that  "if I'd plan to kill myself they would be all over it" or something like that.  Because of that statement her boyfriend was contacted to make sure she was okay.  She had to go to [facility] for a couple of sessions.  States she has no intention of hurting herself in any way.  Has been under a lot of stress with work and a death in her family.  Also financial stress because her rent has gone up several $100 a month, groceries are more expensive, 'and I didn't get a raise.'  Energy and motivation are low but doesn't keep her out of work. Still working from home but [employer] is mandating she go back into the office, but will have to wear and N95 all day. She has HIV and doesn't need to be exposed to other people's germs day in and day out. Plus she can't breathe well with regular surgical masks, much less an N 95. Is very anxious about it all.  Xanax helps.  Personal hygiene is normal. ADLs are normal. States that attention is usually good, but has been distracted more lately because of the worry of going back into the office.  Able to focus on things and finish tasks to completion.Sleeps fairly well.  No suicidal or homicidal thoughts.  Patient denies increased energy with decreased need for sleep, no increased talkativeness, no racing thoughts, no impulsivity  or risky behaviors, no increased spending, no increased libido, no grandiosity, no increased irritability or anger, and no hallucinations.  Denies dizziness, syncope, seizures, numbness, tingling, tremor, tics, unsteady gait, slurred speech, confusion. Denies muscle or joint pain, stiffness, or dystonia. Denies unexplained weight loss, frequent infections, or sores that heal slowly.  No polyphagia, polydipsia, or polyuria. Denies visual changes or paresthesias.   Individual Medical History/ Review of Systems: Changes? :Yes   see HPI  Past medications for mental health diagnoses include: Risperdal, Zoloft, Restoril, trazodone, Xanax, BuSpar was not effective, Cymbalta ineffective, propranolol ineffective.  Allergies: Levofloxacin, Valacyclovir hcl, Sulfamethoxazole-trimethoprim, Levofloxacin, Penicillins, and Bactrim  Current Medications:  Current Outpatient Medications:    ALPRAZolam (XANAX) 1 MG tablet, Take 1 tablet (1 mg total) by mouth 2 (two) times daily as needed for anxiety., Disp: 60 tablet, Rfl: 2   emtricitabine-rilpivir-tenofovir AF (ODEFSEY) 200-25-25 MG TABS tablet, Take 1 tablet by mouth daily with breakfast. Try to take at the same time each day with a full meal, Disp: 90 tablet, Rfl: 3   ondansetron (ZOFRAN) 4 MG tablet, Take 1 tablet (4 mg total) by mouth every 8 (eight) hours as needed for nausea or vomiting., Disp: 30 tablet, Rfl: 3   risperidone (RISPERDAL) 4 MG tablet, TAKE 1 TABLET(4 MG) BY MOUTH DAILY, Disp: 90 tablet, Rfl: 1   risperidone (RISPERDAL) 4 MG tablet, Take 1  tablet (4 mg total) by mouth daily., Disp: 10 tablet, Rfl: 0   traZODone (DESYREL) 100 MG tablet, Take 1-2 tablets (100-200 mg total) by mouth at bedtime., Disp: 180 tablet, Rfl: 1   acyclovir ointment (ZOVIRAX) 5 %, Apply 1 application topically every 3 (three) hours. (Patient not taking: Reported on 05/25/2021), Disp: 15 g, Rfl: 0   benzonatate (TESSALON PERLES) 100 MG capsule, Take 1 capsule (100 mg  total) by mouth 3 (three) times daily as needed for cough. (Patient not taking: No sig reported), Disp: 20 capsule, Rfl: 0   propranolol (INDERAL) 10 MG tablet, Take 1-2 tablets (10-20 mg total) by mouth 3 (three) times daily as needed. (Patient not taking: No sig reported), Disp: 60 tablet, Rfl: 1 Medication Side Effects: none  Family Medical/ Social History: Changes?  See HPI.   MENTAL HEALTH EXAM:  There were no vitals taken for this visit.There is no height or weight on file to calculate BMI.  General Appearance: Casual, Guarded and Well Groomed  Eye Contact:  Good  Speech:  Clear and Coherent and Normal Rate  Volume:  Normal  Mood:  Anxious and Depressed  Affect:  Anxious  Thought Process:  Goal Directed and Descriptions of Associations: Circumstantial  Orientation:  Full (Time, Place, and Person)  Thought Content: Logical   Suicidal Thoughts:  No  Homicidal Thoughts:  No  Memory:  WNL  Judgement:  Good  Insight:  Good  Psychomotor Activity:  Normal  Concentration:  Concentration: Good  Recall:  Good  Fund of Knowledge: Good  Language: Good  Assets:  Desire for Improvement  ADL's:  Intact  Cognition: WNL  Prognosis:  Good    DIAGNOSES:    ICD-10-CM   1. Generalized anxiety disorder  F41.1     2. Bipolar I disorder ([place])  F31.9     3. Insomnia due to other mental disorder  F51.05    F99     4. HIV disease ([place])  B20         Receiving Psychotherapy: No    RECOMMENDATIONS:  PDMP was reviewed.  Last Xanax filled 01/22/2021. I provided 30 minutes of face to face time during this encounter, including time spent before and after the visit in records review, medical decision making, and charting.   As far as her comment goes about suicide, I do not believe she was serious.  Knowing her like I do feel that it was a comment made flippantly out of frustration.  Contract for safety is in place though, she knows to contact our office, call [phone], or go to [facility] on [address]. For FMLA, revision that she is unable to work in the office, she is unable to wear a mask b/c she has HIV and wearing a mask is detrimental to her physical and mental health.  I wrote this out on a prescription pad.  If she needs these updates on a new FMLA form she will have that faxed to [place] and it will be filled out, no charge.  May be the note on a prescription pad will be sufficient though. Cont Xanax 1 mg, 1/2-1 p.o. twice daily as needed.   Continue Risperdal 4 mg p.o. nightly. Continue trazodone 100 mg, 1-2 p.o. nightly as needed sleep. Cont intermittent FMLA needs to be filled out once again, 3 days per month Intermittent FMLA. 8 hours each time. And 2 appts at 4 hours each per month. Return in approx 2 months.'  Helene Kelp  Adelene Idler, PA-C

## 2021-06-04 ENCOUNTER — Other Ambulatory Visit: Payer: BC Managed Care – PPO

## 2021-06-11 ENCOUNTER — Ambulatory Visit: Payer: Medicare Other | Admitting: Physician Assistant

## 2021-06-18 ENCOUNTER — Ambulatory Visit: Payer: BC Managed Care – PPO | Admitting: Infectious Diseases

## 2021-06-25 ENCOUNTER — Other Ambulatory Visit: Payer: BC Managed Care – PPO

## 2021-07-09 ENCOUNTER — Encounter: Payer: BC Managed Care – PPO | Admitting: Infectious Diseases

## 2021-10-21 ENCOUNTER — Encounter (HOSPITAL_COMMUNITY): Payer: Self-pay | Admitting: *Deleted

## 2021-10-21 ENCOUNTER — Other Ambulatory Visit: Payer: Self-pay

## 2021-10-21 ENCOUNTER — Emergency Department (HOSPITAL_COMMUNITY)
Admission: EM | Admit: 2021-10-21 | Discharge: 2021-10-21 | Disposition: A | Discharge status: Home / Self Care | Payer: BC Managed Care – PPO | Attending: Emergency Medicine | Admitting: Emergency Medicine

## 2021-10-21 DIAGNOSIS — T7840XA Allergy, unspecified, initial encounter: Secondary | ICD-10-CM | POA: Diagnosis not present

## 2021-10-21 DIAGNOSIS — T782XXA Anaphylactic shock, unspecified, initial encounter: Secondary | ICD-10-CM | POA: Insufficient documentation

## 2021-10-21 DIAGNOSIS — Z7952 Long term (current) use of systemic steroids: Secondary | ICD-10-CM | POA: Diagnosis not present

## 2021-10-21 LAB — I-STAT BETA HCG BLOOD, ED (MC, WL, AP ONLY): I-stat hCG, quantitative: <5 m[IU]/mL (ref <5)

## 2021-10-21 MED ORDER — PREDNISONE 10 MG PO TABS: 40.0000 mg | ORAL_TABLET | Freq: Every day | ORAL | 0 refills | Status: DC

## 2021-10-21 MED ORDER — ONDANSETRON 4 MG PO TBDP
4.0000 mg | ORAL_TABLET | Freq: Once | ORAL | Status: AC
  Administered 2021-10-21: 4 mg via ORAL
  Filled 2021-10-21: qty 1

## 2021-10-21 MED ORDER — DEXAMETHASONE SODIUM PHOSPHATE 10 MG/ML IJ SOLN
10.0000 mg | Freq: Once | INTRAMUSCULAR | Status: AC
  Administered 2021-10-21: 10 mg via INTRAVENOUS
  Filled 2021-10-21: qty 1

## 2021-10-21 MED ORDER — EPINEPHRINE 0.3 MG/0.3ML IJ SOAJ
0.3000 mg | Freq: Once | INTRAMUSCULAR | Status: AC
  Administered 2021-10-21: 0.3 mg via INTRAMUSCULAR
  Filled 2021-10-21: qty 0.3

## 2021-10-21 MED ORDER — EPINEPHRINE 0.3 MG/0.3ML IJ SOAJ: 0.3000 mg | INTRAMUSCULAR | 0 refills | Status: AC | PRN

## 2021-10-21 MED ORDER — FAMOTIDINE 20 MG PO TABS
20.0000 mg | ORAL_TABLET | Freq: Once | ORAL | Status: AC
  Administered 2021-10-21: 20 mg via ORAL
  Filled 2021-10-21: qty 1

## 2021-10-21 MED ORDER — DIPHENHYDRAMINE HCL 25 MG PO CAPS
25.0000 mg | ORAL_CAPSULE | Freq: Once | ORAL | Status: AC
  Administered 2021-10-21: 25 mg via ORAL
  Filled 2021-10-21: qty 1

## 2021-10-21 MED ORDER — DIPHENHYDRAMINE HCL 25 MG PO TABS: 25.0000 mg | ORAL_TABLET | Freq: Four times a day (QID) | ORAL | 0 refills | Status: DC | PRN

## 2021-10-21 MED ORDER — FAMOTIDINE 40 MG PO TABS: 40.0000 mg | ORAL_TABLET | Freq: Every day | ORAL | 0 refills | Status: DC

## 2021-10-21 NOTE — ED Triage Notes (Signed)
Pt reports taking amoxicillin for dental pain. Has never taken it due to mother being allergic, now having rash, itching, lips tingling and feeling sob. Has not taken an benadryl pta.

## 2021-10-21 NOTE — ED Notes (Signed)
Assumed pt care at this time

## 2021-10-21 NOTE — ED Notes (Signed)
Serita Grammes fiance [phone] requesting an update

## 2021-10-21 NOTE — ED Notes (Signed)
Pt requesting ibuprofen for pain, notified EDP

## 2021-10-21 NOTE — ED Notes (Signed)
Reviewed discharge instructions with patient. Follow-up care and medications reviewed. Patient  verbalized understanding. Patient A&Ox4, VSS, and ambulatory with steady gait upon discharge.

## 2021-10-21 NOTE — ED Provider Notes (Signed)
[facility] EMERGENCY DEPARTMENT Provider Note   CSN: [id number] Arrival date & time: 10/21/21  7829     History  Chief Complaint  Patient presents with   Allergic Reaction    Laura Rogers is a 46 y.o. female.   Allergic Reaction Patient is a 46 year old female presented emergency room today with complaints of rash to her face that she first noticed last night she noticed the rash spread to her chest abdomen both arms and states that this morning she start developing some shortness of breath and wheezing she also felt that her voice was more hoarse.  She states that she has no history of taking penicillin in the past but was unaware of any allergies antibiotics.  Denies any nausea vomiting or diarrhea.  No lightheadedness or dizziness.  She received epinephrine in triage and feels improved now.     Home Medications Prior to Admission medications   Medication Sig Start Date End Date Taking? Authorizing Provider  diphenhydrAMINE (BENADRYL) 25 MG tablet Take 1 tablet (25 mg total) by mouth every 6 (six) hours as needed. 10/21/21  Yes Katja Blue S, PA  EPINEPHrine (EPIPEN 2-PAK) 0.3 mg/0.3 mL IJ SOAJ injection Inject 0.3 mg into the muscle as needed for anaphylaxis. 10/21/21  Yes Marika Mahaffy S, PA  famotidine (PEPCID) 40 MG tablet Take 1 tablet (40 mg total) by mouth daily. 10/21/21  Yes Earnesteen Birnie S, PA  predniSONE (DELTASONE) 10 MG tablet Take 4 tablets (40 mg total) by mouth daily. 10/21/21  Yes Devi Hopman S, PA  acyclovir ointment (ZOVIRAX) 5 % Apply 1 application topically every 3 (three) hours. Patient not taking: Reported on 05/25/2021 09/26/18   Campbell Riches, MD  ALPRAZolam Duanne Moron) 1 MG tablet Take 1 tablet (1 mg total) by mouth 2 (two) times daily as needed for anxiety. 04/23/21   Donnal Moat T, PA-C  benzonatate (TESSALON PERLES) 100 MG capsule Take 1 capsule (100 mg total) by mouth 3 (three) times daily as needed for cough. Patient not taking:  No sig reported 09/24/20   Campbell Riches, MD  emtricitabine-rilpivir-tenofovir AF (ODEFSEY) 200-25-25 MG TABS tablet Take 1 tablet by mouth daily with breakfast. Try to take at the same time each day with a full meal 10/06/20   Campbell Riches, MD  ondansetron (ZOFRAN) 4 MG tablet Take 1 tablet (4 mg total) by mouth every 8 (eight) hours as needed for nausea or vomiting. 10/06/20   Campbell Riches, MD  propranolol (INDERAL) 10 MG tablet Take 1-2 tablets (10-20 mg total) by mouth 3 (three) times daily as needed. Patient not taking: No sig reported 12/24/20   Donnal Moat T, PA-C  risperidone (RISPERDAL) 4 MG tablet TAKE 1 TABLET(4 MG) BY MOUTH DAILY 04/23/21   Donnal Moat T, PA-C  risperidone (RISPERDAL) 4 MG tablet Take 1 tablet (4 mg total) by mouth daily. 05/15/21   Addison Lank, PA-C  traZODone (DESYREL) 100 MG tablet Take 1-2 tablets (100-200 mg total) by mouth at bedtime. 04/23/21   Addison Lank, PA-C      Allergies    Levofloxacin, Valacyclovir hcl, Sulfamethoxazole-trimethoprim, Levofloxacin, Penicillins, and Bactrim    Review of Systems   Review of Systems  Physical Exam Updated Vital Signs BP 108/66    Pulse (!) 58    Temp 99.4 F (37.4 C) (Oral)    Resp 14    LMP 10/18/2021    SpO2 100%  Physical Exam Vitals and nursing note reviewed.  Constitutional:      General: She is not in acute distress. HENT:     Head: Normocephalic and atraumatic.     Nose: Nose normal.  Eyes:     General: No scleral icterus. Cardiovascular:     Rate and Rhythm: Normal rate and regular rhythm.     Pulses: Normal pulses.     Heart sounds: Normal heart sounds.  Pulmonary:     Effort: Pulmonary effort is normal. No respiratory distress.     Breath sounds: No wheezing.     Comments: No wheezing or stridor Abdominal:     Palpations: Abdomen is soft.     Tenderness: There is no abdominal tenderness.  Musculoskeletal:     Cervical back: Normal range of motion.     Right lower leg: No  edema.     Left lower leg: No edema.  Skin:    General: Skin is warm and dry.     Capillary Refill: Capillary refill takes less than 2 seconds.     Comments: Diffuse hives to face, chest, abdomen, arms   Neurological:     Mental Status: She is alert. Mental status is at baseline.  Psychiatric:        Mood and Affect: Mood normal.        Behavior: Behavior normal.    ED Results / Procedures / Treatments   Labs (all labs ordered are listed, but only abnormal results are displayed) Labs Reviewed  I-STAT BETA HCG BLOOD, ED ([facility], [facility], [facility] ONLY)    EKG None  Radiology No results found.  Procedures .Critical Care Performed by: Tedd Sias, PA Authorized by: Tedd Sias, PA   Critical care provider statement:    Critical care time (minutes):  35   Critical care time was exclusive of:  Separately billable procedures and treating other patients and teaching time   Critical care was necessary to treat or prevent imminent or life-threatening deterioration of the following conditions: Anaphylaxis.   Critical care was time spent personally by me on the following activities:  Development of treatment plan with patient or surrogate, review of old charts, re-evaluation of patient's condition, pulse oximetry, ordering and review of radiographic studies, ordering and review of laboratory studies, ordering and performing treatments and interventions, obtaining history from patient or surrogate, examination of patient and evaluation of patient's response to treatment   Care discussed with: admitting provider      Medications Ordered in ED Medications  diphenhydrAMINE (BENADRYL) capsule 25 mg (25 mg Oral Given 10/21/21 1006)  famotidine (PEPCID) tablet 20 mg (20 mg Oral Given 10/21/21 1006)  ondansetron (ZOFRAN-ODT) disintegrating tablet 4 mg (4 mg Oral Given 10/21/21 1007)  EPINEPHrine (EPI-PEN) injection 0.3 mg (0.3 mg Intramuscular Given 10/21/21 1007)  dexamethasone (DECADRON) injection 10  mg (10 mg Intravenous Given 10/21/21 1051)    ED Course/ Medical Decision Making/ A&P Clinical Course as of 10/21/21 1447  Thu Oct 21, 2021  1031 Patient with respiratory and dermatologic symptoms of anaphylaxis.  Epinephrine given at 1007.  Serial exams so far patient seems to be improving.  Wheezing has stopped. [WF]  1254 Discharge home at 2 PM after 4-hour observation [WF]    Clinical Course User Index [WF] Tedd Sias, [place]                           Medical Decision Making Amount and/or Complexity of Data Reviewed Labs: ordered.  Risk OTC drugs.  Prescription drug management.   This patient presents to the ED for concern of difficulty breathing, rash/hives, this involves a number of treatment options, and is a complaint that carries with it a high risk of complications and morbidity.  The differential diagnosis includes anaphylaxis, allergic reaction   Co morbidities: Discussed in HPI   Brief History:  Patient on amoxicillin and clindamycin has been on these medications since January 30 for dental infection.  Family history of severe anaphylactic reactions to penicillins  EMR reviewed including pt PMHx, past surgical history and past visits to ER.   See HPI for more details   Lab Tests:  I ordered and independently interpreted labs.  The pertinent results include:    Labs notable for negative for pregnancy   Imaging Studies:  No imaging studies ordered for this patient    Cardiac Monitoring:  The patient was maintained on a cardiac monitor.  I personally viewed and interpreted the cardiac monitored which showed an underlying rhythm of: NSR NA   Medicines ordered:  I ordered medication including epinephrine, Zofran, Benadryl, Pepcid, Decadron for anaphylaxis Reevaluation of the patient after these medicines showed that the patient resolved I have reviewed the patients home medicines and have made adjustments as needed   Critical  Interventions:  Epinephrine   Consults:      Reevaluation:  After the interventions noted above I re-evaluated patient and found that they have :resolved   Social Determinants of Health:  The patient's social determinants of health were a factor in the care of this patient    Problem List / ED Course:  Anaphylaxis Dental infection   Dispostion:  After consideration of the diagnostic results and the patients response to treatment, I feel that the patent would benefit from follow-up with allergist.  Patient was observed here in the ER for 5 hours after epinephrine administration.   We do lengthy to decision-making your physician about her antibiotics and dental infection.  I recommended she talk to her dentist about whether she there were alternative antibiotic regimens for her.  I recommended that she cease taking the amoxicillin completely.  Discharged home with prednisone, Benadryl, Pepcid and EpiPen  Final Clinical Impression(s) / ED Diagnoses Final diagnoses:  Anaphylaxis, initial encounter    Rx / DC Orders ED Discharge Orders          Ordered    predniSONE (DELTASONE) 10 MG tablet  Daily        10/21/21 1446    diphenhydrAMINE (BENADRYL) 25 MG tablet  Every 6 hours PRN        10/21/21 1446    famotidine (PEPCID) 40 MG tablet  Daily        10/21/21 1446    EPINEPHrine (EPIPEN 2-PAK) 0.3 mg/0.3 mL IJ SOAJ injection  As needed        10/21/21 1446              Tedd Sias, [place] 10/21/21 1501    Malvin Johns, MD 10/21/21 1526

## 2021-10-21 NOTE — Discharge Instructions (Addendum)
Please stop taking amoxicillin. You may continue taking clindamycin as most likely the amoxicillin is the cause of this reaction. However, if your symptoms occur you will need to be off all antibiotics until you are seen by an allergist/immunologist.  Please call your dentist to discuss what antibiotics to continue.  I have prescribed an epinephrine pen.  Please talk to the pharmacist about how to administer this however, as we discussed, if you have difficulty breathing or recurrence of symptoms he had earlier today you will need to administer this into your thigh on the outside of the thigh.  Recommend taking Benadryl 25 mg every 6 hours for 4 hours after this he can treat only as needed for any rash or hives  Also prescribed you a 4-day course of prednisone.  Please take this first dose tomorrow morning and take as directed until course is completed  If you do use the EpiPen please to be transferred immediately to emergency room after

## 2021-10-21 NOTE — ED Provider Triage Note (Signed)
Emergency Medicine Provider Triage Evaluation Note  Laura Rogers , a 46 y.o. female  was evaluated in triage.  Pt complains of logic reaction.  Patient states that she had dental procedure and was prescribed amoxicillin and ciprofloxacin.  She states that this was prescribed on 10/11/2021.  She states that she has been taking it up until now.  She states that about 2 days ago she began noticing blotching on her face but did not think much of it.  She states that she woke up this morning with diffuse rash to her trunk, extremities and worsening rash on her face.  She states additionally she started feeling like her voice sounded different.  She states that she is unsure if she is allergic to penicillin but her mother never gave it to her.  Does not believe she is ever taken penicillin previously.  She denies worsening shortness of breath or feeling like her lips or tongue are swollen however she does feel like her voice is different.  Additionally he is HIV positive, taking antiretrovirals.  Review of Systems  Positive: See above Negative:   Physical Exam  BP 138/85 (BP Location: Right Arm)    Pulse (!) 113    Temp 99.4 F (37.4 C) (Oral)    Resp 20    LMP 10/18/2021    SpO2 99%  Gen:   Awake, no distress   Resp:  Normal effort, lungs clear bilaterally MSK:   Moves extremities without difficulty  Other:  Oropharynx with no obvious tongue or lip swelling.  There is no intraoral rash.  Airway is patent.  No stridor.  Patient does not objectively appear short of breath.  If use rash to the trunk, arms and legs.  Medical Decision Making  Medically screening exam initiated at 9:39 AM.  Appropriate orders placed.  TUCKER STEEDLEY was informed that the remainder of the evaluation will be completed by another provider, this initial triage assessment does not replace that evaluation, and the importance of remaining in the ED until their evaluation is complete.  We will expedite room placement, orders placed  for epinephrine, Benadryl, Zofran, steroids.   Mickie Hillier, PA-C 10/21/21 [phone]

## 2021-10-24 ENCOUNTER — Encounter: Payer: Self-pay | Admitting: Infectious Diseases

## 2021-10-25 ENCOUNTER — Other Ambulatory Visit: Payer: Self-pay

## 2021-10-25 ENCOUNTER — Telehealth: Payer: Self-pay

## 2021-10-25 DIAGNOSIS — Z113 Encounter for screening for infections with a predominantly sexual mode of transmission: Secondary | ICD-10-CM

## 2021-10-25 DIAGNOSIS — B20 Human immunodeficiency virus [HIV] disease: Secondary | ICD-10-CM

## 2021-10-25 DIAGNOSIS — Z79899 Other long term (current) drug therapy: Secondary | ICD-10-CM

## 2021-10-25 NOTE — Telephone Encounter (Addendum)
Called patient regarding mychart message and reaction to penicillin. Patient states that she has been feeling tired since having her reaction. Is concerned that her VL might go up, denies missing any doses of medication. Informed patient that as long she is taking her medication as prescribed she should be okay.  Requested she follow up with Dentist regarding reaction for something to help with what is going on. Informed patient that office does not have open slots and should follow up with a PCP if she is feeling worse. Patient stated she would just wait until her appt with Dr. Johnnye Sima and ended call.  Leatrice Jewels, RMA

## 2021-10-26 ENCOUNTER — Ambulatory Visit (INDEPENDENT_AMBULATORY_CARE_PROVIDER_SITE_OTHER): Payer: BC Managed Care – PPO | Admitting: Physician Assistant

## 2021-10-26 ENCOUNTER — Encounter: Payer: Self-pay | Admitting: Physician Assistant

## 2021-10-26 ENCOUNTER — Encounter: Payer: Self-pay | Admitting: Infectious Diseases

## 2021-10-26 ENCOUNTER — Other Ambulatory Visit: Payer: Self-pay

## 2021-10-26 DIAGNOSIS — F319 Bipolar disorder, unspecified: Secondary | ICD-10-CM

## 2021-10-26 DIAGNOSIS — F4323 Adjustment disorder with mixed anxiety and depressed mood: Secondary | ICD-10-CM

## 2021-10-26 DIAGNOSIS — F5105 Insomnia due to other mental disorder: Secondary | ICD-10-CM

## 2021-10-26 DIAGNOSIS — F411 Generalized anxiety disorder: Secondary | ICD-10-CM

## 2021-10-26 DIAGNOSIS — F4310 Post-traumatic stress disorder, unspecified: Secondary | ICD-10-CM | POA: Diagnosis not present

## 2021-10-26 DIAGNOSIS — B20 Human immunodeficiency virus [HIV] disease: Secondary | ICD-10-CM

## 2021-10-26 DIAGNOSIS — F99 Mental disorder, not otherwise specified: Secondary | ICD-10-CM

## 2021-10-26 MED ORDER — QUETIAPINE FUMARATE 100 MG PO TABS: ORAL_TABLET | ORAL | 0 refills | Status: DC

## 2021-10-26 NOTE — Progress Notes (Signed)
[facility]  Patient ID: Laura Rogers,  MRN: [id number]  PCP: Pcp, No  Date of Evaluation: 10/26/2021 time spent:30 minutes  Chief Complaint:  Chief Complaint   Anxiety; Depression; Insomnia; Follow-up      HISTORY/CURRENT STATUS: HPI Not doing well.   Went to dentist for severe tooth pain. States the dentist only wanted the money, to pull it and put in implant. Couldn't afford it.  He gave her Amoxicillin, she has known allergy to PCN. Said dentist didn't even ask her if she had any allergies and it was on chart. She was hurting so bad she didn't look at the medicine bottle, she took the pills.  Is blaming herself for what happened, she should have looked at it. Had anaphylaxis, had hives, tongue swelling, facial swelling, dyspnea, wheezing, hoarseness. Drove herself to the ER, (see 10/21/2021 note.) Had to have epinephrine. Was there for 6 hours for tx and observation. She was told by the ER to let her dentist know that she had the allergic reaction. When she did, states the dentist grabbed the phone from the receptionist and said he didn't prescribe it. The [facility] has the Rx and has given her a copy of it, and she has the bottle of pills. 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 Has been hurting so bad with her tooth, she's still got rash and is having to take Benadryl to help that, worried sick that AIDS is back. She had bad rash when she was first dx w/ AIDS. Not eating much, not sleeping at night, isolating, nightmares, dreams about the dentist and being in the hosp again and it brought back a lot of horrible memories of when in hosp for AIDS,  very anxious w/ PA, and is exhausted.   Patient denies increased energy with decreased need for sleep, no increased talkativeness, no racing thoughts, no impulsivity or risky  behaviors, no increased spending, no increased libido, no grandiosity, no increased irritability or anger, and no hallucinations.  Denies dizziness, syncope, seizures, numbness, tingling, tremor, tics, unsteady gait, slurred speech, confusion. Denies muscle or joint pain, stiffness, or dystonia. Denies unexplained weight loss, frequent infections, or sores that heal slowly.  No polyphagia, polydipsia, or polyuria. Denies visual changes or paresthesias.   Individual Medical History/ Review of Systems: Changes? :Yes   see HPI  Past medications for mental health diagnoses include: Risperdal, Zoloft, Restoril, trazodone not effective, Xanax, BuSpar was not effective, Cymbalta ineffective, propranolol ineffective.  Allergies: Amoxicillin, Levofloxacin, Valacyclovir hcl, Sulfamethoxazole-trimethoprim, Levofloxacin, Penicillins, and Bactrim  Current Medications:  Current Outpatient Medications:    ALPRAZolam (XANAX) 1 MG tablet, Take 1 tablet (1 mg total) by mouth 2 (two) times daily as needed for anxiety., Disp: 60 tablet, Rfl: 2   diphenhydrAMINE (BENADRYL) 25 MG tablet, Take 1 tablet (25 mg total) by mouth every 6 (six) hours as needed., Disp: 30 tablet, Rfl: 0   EPINEPHrine (EPIPEN 2-PAK) 0.3 mg/0.3 mL IJ SOAJ injection, Inject 0.3 mg into the muscle as needed for anaphylaxis., Disp: 1 each, Rfl: 0   famotidine (PEPCID) 40 MG tablet, Take 1 tablet (40 mg total) by mouth daily., Disp: 7 tablet, Rfl: 0   ondansetron (ZOFRAN) 4 MG tablet, Take 1 tablet (4 mg total) by mouth every 8 (eight) hours as needed for nausea or vomiting., Disp: 30 tablet,  Rfl: 3   predniSONE (DELTASONE) 10 MG tablet, Take 4 tablets (40 mg total) by mouth daily., Disp: 15 tablet, Rfl: 0   QUEtiapine (SEROQUEL) 100 MG tablet, 1/2 po qhs for 1 night, then increase 1 po qhs., Disp: 30 tablet, Rfl: 0   risperidone (RISPERDAL) 4 MG tablet, TAKE 1 TABLET(4 MG) BY MOUTH DAILY, Disp: 90 tablet, Rfl: 1   risperidone (RISPERDAL) 4 MG  tablet, Take 1 tablet (4 mg total) by mouth daily., Disp: 10 tablet, Rfl: 0   traZODone (DESYREL) 100 MG tablet, Take 1-2 tablets (100-200 mg total) by mouth at bedtime., Disp: 180 tablet, Rfl: 1   acyclovir ointment (ZOVIRAX) 5 %, Apply 1 application topically every 3 (three) hours. (Patient not taking: Reported on 05/25/2021), Disp: 15 g, Rfl: 0   benzonatate (TESSALON PERLES) 100 MG capsule, Take 1 capsule (100 mg total) by mouth 3 (three) times daily as needed for cough. (Patient not taking: Reported on 04/23/2021), Disp: 20 capsule, Rfl: 0   ODEFSEY 200-25-25 MG TABS tablet, TAKE 1 TABLET BY MOUTH DAILY AT THE SAME TIME EVERY DAY WITH BREAKFAST OR A FULL MEAL, Disp: 30 tablet, Rfl: 0   propranolol (INDERAL) 10 MG tablet, Take 1-2 tablets (10-20 mg total) by mouth 3 (three) times daily as needed. (Patient not taking: Reported on 04/23/2021), Disp: 60 tablet, Rfl: 1 Medication Side Effects: none  Family Medical/ Social History: Changes?  See HPI.   MENTAL HEALTH EXAM:  Last menstrual period 10/18/2021.There is no height or weight on file to calculate BMI.  General Appearance: Casual, Guarded and Well Groomed  Eye Contact:  Good  Speech:  Clear and Coherent and Normal Rate  Volume:  Normal  Mood:  Anxious and Depressed  Affect:  Depressed, Tearful, and Anxious  Thought Process:  Goal Directed and Descriptions of Associations: Circumstantial  Orientation:  Full (Time, Place, and Person)  Thought Content: Logical   Suicidal Thoughts:  No  Homicidal Thoughts:  No  Memory:  WNL  Judgement:  Good  Insight:  Good  Psychomotor Activity:  Normal  Concentration:  Concentration: Good  Recall:  Good  Fund of Knowledge: Good  Language: Good  Assets:  Desire for Improvement  ADL's:  Intact  Cognition: WNL  Prognosis:  Good    DIAGNOSES:    ICD-10-CM   1. PTSD (post-traumatic stress disorder)  F43.10     2. Bipolar I disorder ([place])  F31.9     3. Situational mixed anxiety and depressive  disorder  F43.23     4. Generalized anxiety disorder  F41.1     5. Insomnia due to other mental disorder  F51.05    F99     6. HIV disease ([place])  B20        Receiving Psychotherapy: No    RECOMMENDATIONS:  PDMP was reviewed.  Last Xanax filled 10/11/2021. I provided 30 minutes of face to face time during this encounter, including time spent before and after the visit in records review, medical decision making, counseling pertinent to today's visit, and charting.  Discussed the situation, unable to work right now d/t situational anxiety/depression/insomnia.  Discussed PTSD from anaphylaxis, near death, plus reminding her of having full-blown AIDS. Recommend adding Seroquel to help all sx. Discussed potential metabolic side effects associated with atypical antipsychotics.  Labs will need to be drawn periodically to monitor metabolic function. Discussed potential risk for movement side effects. Patient understands and accepts these risks and has been advised to contact office if any  movement side effects occur.   Start  Seroquel 100 mg, 1/2 po qhs for 1 night, then 1 qhs. May need to increase further, she can call and will do over the phone. Cont Xanax 1 mg, 1/2-1 p.o. twice daily as needed.   Continue Risperdal 4 mg p.o. nightly. Out of work 10/22/21-11/22/2021  Cont intermittent FMLA needs to be filled out once again, 3 days per month Intermittent FMLA. 8 hours each time. And 2 appts at 4 hours each per month. Return in 2-3 weeks.  Donnal Moat, PA-C

## 2021-10-28 ENCOUNTER — Other Ambulatory Visit: Payer: BC Managed Care – PPO

## 2021-10-28 ENCOUNTER — Other Ambulatory Visit: Payer: Self-pay | Admitting: Infectious Diseases

## 2021-10-28 ENCOUNTER — Telehealth: Payer: Self-pay

## 2021-10-28 DIAGNOSIS — B20 Human immunodeficiency virus [HIV] disease: Secondary | ICD-10-CM

## 2021-10-28 NOTE — Telephone Encounter (Signed)
Received fax from [employer]. Completion needed for her FMLA form from [place] in Traci's box.

## 2021-10-29 ENCOUNTER — Other Ambulatory Visit: Payer: BC Managed Care – PPO

## 2021-10-29 ENCOUNTER — Other Ambulatory Visit: Payer: Self-pay

## 2021-10-29 DIAGNOSIS — Z79899 Other long term (current) drug therapy: Secondary | ICD-10-CM

## 2021-10-29 DIAGNOSIS — Z113 Encounter for screening for infections with a predominantly sexual mode of transmission: Secondary | ICD-10-CM | POA: Diagnosis not present

## 2021-10-29 DIAGNOSIS — B20 Human immunodeficiency virus [HIV] disease: Secondary | ICD-10-CM

## 2021-11-01 LAB — COMPLETE METABOLIC PANEL WITH GFR
AG Ratio: 1.4 (calc) (ref 1.0–2.5)
ALT: 21 U/L (ref 6–29)
AST: 15 U/L (ref 10–30)
Albumin: 4 g/dL (ref 3.6–5.1)
Alkaline phosphatase (APISO): 52 U/L (ref 31–125)
BUN: 9 mg/dL (ref 7–25)
CO2: 26 mmol/L (ref 20–32)
Calcium: 9.4 mg/dL (ref 8.6–10.2)
Chloride: 101 mmol/L (ref 98–110)
Creat: 0.66 mg/dL (ref 0.50–0.99)
Globulin: 2.9 g/dL (calc) (ref 1.9–3.7)
Glucose, Bld: 94 mg/dL (ref 65–99)
Potassium: 4.6 mmol/L (ref 3.5–5.3)
Sodium: 136 mmol/L (ref 135–146)
Total Bilirubin: 0.3 mg/dL (ref 0.2–1.2)
Total Protein: 6.9 g/dL (ref 6.1–8.1)
eGFR: 111 mL/min/{1.73_m2} (ref >=60)

## 2021-11-01 LAB — CBC WITH DIFFERENTIAL/PLATELET
Absolute Monocytes: 429 cells/uL (ref 200–950)
Basophils Absolute: 45 cells/uL (ref 0–200)
Basophils Relative: 0.4 %
Eosinophils Absolute: 34 cells/uL (ref 15–500)
Eosinophils Relative: 0.3 %
HCT: 38.6 % (ref 35.0–45.0)
Hemoglobin: 13.2 g/dL (ref 11.7–15.5)
Lymphs Abs: 1650 cells/uL (ref 850–3900)
MCH: 30.9 pg (ref 27.0–33.0)
MCHC: 34.2 g/dL (ref 32.0–36.0)
MCV: 90.4 fL (ref 80.0–100.0)
MPV: 12.4 fL (ref 7.5–12.5)
Monocytes Relative: 3.8 %
Neutro Abs: 9142 cells/uL — ABNORMAL HIGH (ref 1500–7800)
Neutrophils Relative %: 80.9 %
Platelets: 265 10*3/uL (ref 140–400)
RBC: 4.27 10*6/uL (ref 3.80–5.10)
RDW: 12.6 % (ref 11.0–15.0)
Total Lymphocyte: 14.6 %
WBC: 11.3 10*3/uL — ABNORMAL HIGH (ref 3.8–10.8)

## 2021-11-01 LAB — HIV-1 RNA QUANT-NO REFLEX-BLD
HIV 1 RNA Quant: 8820 Copies/mL — ABNORMAL HIGH
HIV-1 RNA Quant, Log: 3.95 Log cps/mL — ABNORMAL HIGH

## 2021-11-01 LAB — T-HELPER CELLS (CD4) COUNT (NOT AT ARMC)
Absolute CD4: 215 cells/uL — ABNORMAL LOW (ref 490–1740)
CD4 T Helper %: 13 % — ABNORMAL LOW (ref 30–61)
Total lymphocyte count: 1651 cells/uL (ref 850–3900)

## 2021-11-01 LAB — RPR: RPR Ser Ql: NONREACTIVE

## 2021-11-01 LAB — C. TRACHOMATIS/N. GONORRHOEAE RNA
C. trachomatis RNA, TMA: NOT DETECTED
N. gonorrhoeae RNA, TMA: NOT DETECTED

## 2021-11-11 ENCOUNTER — Ambulatory Visit (INDEPENDENT_AMBULATORY_CARE_PROVIDER_SITE_OTHER): Payer: BC Managed Care – PPO | Admitting: Infectious Diseases

## 2021-11-11 ENCOUNTER — Other Ambulatory Visit: Payer: Self-pay

## 2021-11-11 DIAGNOSIS — G43009 Migraine without aura, not intractable, without status migrainosus: Secondary | ICD-10-CM

## 2021-11-11 DIAGNOSIS — F319 Bipolar disorder, unspecified: Secondary | ICD-10-CM | POA: Diagnosis not present

## 2021-11-11 DIAGNOSIS — N870 Mild cervical dysplasia: Secondary | ICD-10-CM

## 2021-11-11 DIAGNOSIS — K0889 Other specified disorders of teeth and supporting structures: Secondary | ICD-10-CM

## 2021-11-11 DIAGNOSIS — K5900 Constipation, unspecified: Secondary | ICD-10-CM | POA: Diagnosis not present

## 2021-11-11 DIAGNOSIS — B20 Human immunodeficiency virus [HIV] disease: Secondary | ICD-10-CM | POA: Diagnosis not present

## 2021-11-11 DIAGNOSIS — K047 Periapical abscess without sinus: Secondary | ICD-10-CM

## 2021-11-11 MED ORDER — ONDANSETRON HCL 4 MG PO TABS: 4.0000 mg | ORAL_TABLET | Freq: Three times a day (TID) | ORAL | 3 refills | Status: DC | PRN

## 2021-11-11 MED ORDER — IBUPROFEN 800 MG PO TABS: 800.0000 mg | ORAL_TABLET | Freq: Three times a day (TID) | ORAL | 0 refills | Status: DC | PRN

## 2021-11-11 NOTE — Addendum Note (Signed)
Addended by: Theresia Majors A on: 11/11/2021 02:21 PM ? ? Modules accepted: Orders ? ?

## 2021-11-11 NOTE — Telephone Encounter (Signed)
Pt called and wanted to know the status of her fmla paperwork that was faxed to [place] on 10/28/21. Please call her and let her know the status ?

## 2021-11-11 NOTE — Assessment & Plan Note (Signed)
Will refill her ibuprofen.  ?

## 2021-11-11 NOTE — Progress Notes (Signed)
? ?  Subjective:  ? ? Patient ID: Laura Rogers, female  DOB: 1976-08-29, 46 y.o.        MRN: [id number] ? ? ?HPI ?46 yo F with HIV+, depression. Also with hx of cluster headaches.   ?Genvoya --> odefsy -->  Tivicay/descovy --> off --> biktravy. At her visit (12-2018) she was changed back to [facility]. Each of them made her nauseous.  ?  ?Also hx of CIN1, had colpo 04-2015. Negative.  ?Had PAP negative 11-2015. Needs apt for repeat- still pending. .  ?  ?Has been seeing psych- for biploar, taking riplan and xanax.  ? ?At her last visit (01-2021) she had severe constipation. She was given otcs.Is now moving bowels Qam.  ?She had adr to amox after seen by dentist. She was seen in ED. States she now has PTSD.  ?She is having oral surgery 11-18-21.  ?Her mental health has been worse since then. Can't sleep (thoughts of being in ER, dentist, financial stress). See's her therapist next week,  currently being held out of work.  ?Denies missed ART(odefsy). CD4 215.  ? ? ?HIV 1 RNA Quant  ?Date Value  ?10/29/2021 8,820 Copies/mL (H)  ?09/29/2020 <20 Copies/mL (H)  ?02/20/2019 <20 DETECTED copies/mL (A)  ? ?CD4 T Cell Abs (/uL)  ?Date Value  ?09/29/2020 421  ?02/20/2019 325 (L)  ?11/29/2018 290 (L)  ? ? ? ?Health Maintenance  ?Topic Date Due  ?? COVID-19 Vaccine (1) Never done  ?? TETANUS/TDAP  Never done  ?? PAP SMEAR-Modifier  12/11/2018  ?? INFLUENZA VACCINE  04/12/2021  ?? Hepatitis C Screening  Completed  ?? HIV Screening  Completed  ?? HPV VACCINES  Aged Out  ? ? ? ? ?Review of Systems  ?Constitutional:  Positive for malaise/fatigue. Negative for chills, fever and weight loss.  ?Respiratory:  Negative for cough and shortness of breath.   ?Cardiovascular:  Positive for palpitations. Negative for chest pain.  ?Gastrointestinal:  Negative for constipation and diarrhea.  ?Genitourinary:  Negative for dysuria.  ?Neurological:  Positive for headaches.  ?Psychiatric/Behavioral:  Positive for depression.   ? ?Please see HPI. All other  systems reviewed and negative. ? ?   ?Objective:  ?Physical Exam ?Vitals reviewed.  ?Constitutional:   ?   Appearance: Normal appearance.  ?HENT:  ?   Mouth/Throat:  ?   Mouth: Mucous membranes are moist.  ?   Pharynx: No oropharyngeal exudate.  ?Eyes:  ?   Extraocular Movements: Extraocular movements intact.  ?   Pupils: Pupils are equal, round, and reactive to light.  ?Cardiovascular:  ?   Rate and Rhythm: Tachycardia present.  ?Pulmonary:  ?   Breath sounds: Normal breath sounds.  ?Abdominal:  ?   General: Bowel sounds are normal. There is no distension.  ?   Palpations: Abdomen is soft.  ?   Tenderness: There is no abdominal tenderness.  ?Musculoskeletal:     ?   General: Normal range of motion.  ?   Cervical back: Normal range of motion and neck supple.  ?   Right lower leg: No edema.  ?   Left lower leg: No edema.  ?Neurological:  ?   General: No focal deficit present.  ?   Mental Status: She is alert.  ?Psychiatric:     ?   Attention and Perception: Attention normal.     ?   Mood and Affect: Mood is anxious.  ? ? ? ? ? ?   ?Assessment & Plan:  ? ?

## 2021-11-11 NOTE — Assessment & Plan Note (Signed)
States she is adherent,  ?She would like cabaneuva. Will ask pharm ?Will recheck her labs (tomorrow) for HIV RNA and geno ?Will see her back in 3-4 week.  ?Offered/refused condoms.  ?Refill zofran.  ?

## 2021-11-11 NOTE — Telephone Encounter (Signed)
Paper work completed and given to [place] to sign ?

## 2021-11-11 NOTE — Assessment & Plan Note (Signed)
resolved 

## 2021-11-11 NOTE — Assessment & Plan Note (Signed)
Will get her in with [place] ?

## 2021-11-11 NOTE — Assessment & Plan Note (Signed)
apprecaite her therapists f/u.  ?

## 2021-11-11 NOTE — Assessment & Plan Note (Signed)
Written zofran to help with nausea (? From clinda).  ?Dental surgery 3-9 ?

## 2021-11-12 ENCOUNTER — Other Ambulatory Visit: Payer: BC Managed Care – PPO

## 2021-11-12 ENCOUNTER — Telehealth: Payer: Self-pay | Admitting: Physician Assistant

## 2021-11-12 NOTE — Telephone Encounter (Signed)
Pt's FMLA paperwork completed and faxed 3/2. Notified Pt copy available at front desk to pick up ?

## 2021-11-16 ENCOUNTER — Other Ambulatory Visit: Payer: BC Managed Care – PPO

## 2021-11-17 ENCOUNTER — Other Ambulatory Visit: Payer: BC Managed Care – PPO

## 2021-11-17 ENCOUNTER — Encounter: Payer: Self-pay | Admitting: Physician Assistant

## 2021-11-17 ENCOUNTER — Other Ambulatory Visit: Payer: Self-pay

## 2021-11-17 ENCOUNTER — Ambulatory Visit (INDEPENDENT_AMBULATORY_CARE_PROVIDER_SITE_OTHER): Payer: BC Managed Care – PPO | Admitting: Physician Assistant

## 2021-11-17 DIAGNOSIS — F4310 Post-traumatic stress disorder, unspecified: Secondary | ICD-10-CM | POA: Diagnosis not present

## 2021-11-17 DIAGNOSIS — F4323 Adjustment disorder with mixed anxiety and depressed mood: Secondary | ICD-10-CM | POA: Diagnosis not present

## 2021-11-17 DIAGNOSIS — F514 Sleep terrors [night terrors]: Secondary | ICD-10-CM | POA: Diagnosis not present

## 2021-11-17 DIAGNOSIS — F319 Bipolar disorder, unspecified: Secondary | ICD-10-CM | POA: Diagnosis not present

## 2021-11-17 MED ORDER — RISPERIDONE 4 MG PO TABS: 4.0000 mg | ORAL_TABLET | Freq: Every day | ORAL | 0 refills | Status: DC

## 2021-11-17 MED ORDER — ALPRAZOLAM 1 MG PO TABS: 1.0000 mg | ORAL_TABLET | Freq: Two times a day (BID) | ORAL | 0 refills | Status: DC | PRN

## 2021-11-17 MED ORDER — QUETIAPINE FUMARATE 200 MG PO TABS: 200.0000 mg | ORAL_TABLET | Freq: Every day | ORAL | 0 refills | Status: DC

## 2021-11-17 NOTE — Progress Notes (Signed)
[facility] ? ?Patient ID: Laura Rogers,  ?MRN: [id number] ? ?PCP: Pcp, No ? ?Date of Evaluation: 11/17/2021 ?time spent:40 minutes ? ?Chief Complaint:  ?Chief Complaint   ?Anxiety; Depression; Follow-up ?  ? ? ?HISTORY/CURRENT STATUS: ?HPI For routine f/u. ? ?Still very anxious but more depressed than anxious right now. Isolating, sleeping a lot, still having nightmares. The Seroquel might have helped a little. The nightmares are still a problem though and she's terrified about having oral surgery tomorrow. "I'll finally get my tooth taken care of, that's what started all this in the first place." (See note 2021/11/12.) But is scared to death something else will go wrong. Not having PA but very uneasy all the time like something bad will happen. Personal hygiene has decreased. Doesn't want to go out of the house. Only does what she has to. Energy and motivation are low. Never feels rested. Appetite is unchanged. Having a hard time staying on task, gets distracted too easily. Is out of work b/c of these sx. No SI/HI. ? ?Saw Dr. Johnnye Sima, ID, since our last visit. They discussed the allergic rxn she had to the Amoxil, she'll be seeing him again the end of the month. Is still very nervous about her labs  ? ?Patient denies increased energy with decreased need for sleep, no increased talkativeness, no racing thoughts, no impulsivity or risky behaviors, no increased spending, no increased libido, no grandiosity, no increased irritability or anger, no paranoia, and no hallucinations. ? ?Review of Systems  ?Constitutional:  Positive for malaise/fatigue.  ?HENT: Negative.    ?Eyes: Negative.   ?Respiratory: Negative.    ?Cardiovascular: Negative.   ?Gastrointestinal: Negative.   ?Genitourinary: Negative.   ?Musculoskeletal: Negative.   ?Skin: Negative.   ?Neurological: Negative.   ?Endo/Heme/Allergies: Negative.   ?Psychiatric/Behavioral:    ?     See HPI  ? ?Individual Medical History/ Review of Systems:  Changes? :Yes   see HPI ? ?Past medications for mental health diagnoses include: ?Risperdal, Zoloft, Restoril, trazodone not effective, Xanax, BuSpar was not effective, Cymbalta ineffective, propranolol ineffective. ? ?Allergies: Amoxicillin, Levofloxacin, Valacyclovir hcl, Sulfamethoxazole-trimethoprim, Levofloxacin, Penicillins, and Bactrim ? ?Current Medications:  ?Current Outpatient Medications:  ?  clindamycin (CLEOCIN) 300 MG capsule, Take 300 mg by mouth every 8 (eight) hours., Disp: , Rfl:  ?  diphenhydrAMINE (BENADRYL) 25 MG tablet, Take 1 tablet (25 mg total) by mouth every 6 (six) hours as needed., Disp: 30 tablet, Rfl: 0 ?  EPINEPHrine (EPIPEN 2-PAK) 0.3 mg/0.3 mL IJ SOAJ injection, Inject 0.3 mg into the muscle as needed for anaphylaxis., Disp: 1 each, Rfl: 0 ?  ibuprofen (ADVIL) 800 MG tablet, Take 1 tablet (800 mg total) by mouth every 8 (eight) hours as needed for moderate pain., Disp: 30 tablet, Rfl: 0 ?  ODEFSEY 200-25-25 MG TABS tablet, TAKE 1 TABLET BY MOUTH DAILY AT THE SAME TIME EVERY DAY WITH BREAKFAST OR A FULL MEAL, Disp: 30 tablet, Rfl: 0 ?  ondansetron (ZOFRAN) 4 MG tablet, Take 1 tablet (4 mg total) by mouth every 8 (eight) hours as needed for nausea or vomiting., Disp: 30 tablet, Rfl: 3 ?  QUEtiapine (SEROQUEL) 200 MG tablet, Take 1 tablet (200 mg total) by mouth at bedtime., Disp: 30 tablet, Rfl: 0 ?  ALPRAZolam (XANAX) 1 MG tablet, Take 1 tablet (1 mg total) by mouth 2 (two) times daily as needed for anxiety., Disp: 60 tablet, Rfl: 0 ?  risperidone (RISPERDAL) 4 MG tablet, Take 1 tablet (4 mg total)  by mouth daily., Disp: 90 tablet, Rfl: 0 ?Medication Side Effects: none ? ?Family Medical/ Social History: Changes?  See HPI. ? ? ?MENTAL HEALTH EXAM: ? ?Last menstrual period 10/18/2021.There is no height or weight on file to calculate BMI.  ?General Appearance: Casual, Guarded and Well Groomed  ?Eye Contact:  Good  ?Speech:  Clear and Coherent and Normal Rate  ?Volume:  Normal  ?Mood:   Anxious and Depressed  ?Affect:  Depressed and Anxious  ?Thought Process:  Goal Directed and Descriptions of Associations: Circumstantial  ?Orientation:  Full (Time, Place, and Person)  ?Thought Content: Logical   ?Suicidal Thoughts:  No  ?Homicidal Thoughts:  No  ?Memory:  WNL  ?Judgement:  Good  ?Insight:  Good  ?Psychomotor Activity:  Normal  ?Concentration:  Concentration: Fair and Attention Span: Fair  ?Recall:  Good  ?Fund of Knowledge: Good  ?Language: Good  ?Assets:  Desire for Improvement ?Financial Resources/Insurance ?Housing ?Transportation ?Vocational/Educational  ?ADL's:  Intact  ?Cognition: WNL  ?Prognosis:  Good  ? ?10/29/2021 ?CBC- WBC 11.3, ANC 9.1 ?CMP-Glu 94, all nl ? ? ?DIAGNOSES:  ?  ICD-10-CM   ?1. PTSD (post-traumatic stress disorder)  F43.10   ?  ?2. Bipolar I disorder ([place])  F31.9   ?  ?3. Situational mixed anxiety and depressive disorder  F43.23   ?  ?4. Night terrors, adult  F51.4   ?  ? ? ? ? ?Receiving Psychotherapy: No  ? ? ?RECOMMENDATIONS:  ?PDMP was reviewed.  Last Xanax filled 10/11/2021. Triazolam filled 11/02/2021 (for upcoming dental work.) ?I provided 40 minutes of face to face time during this encounter, including time spent before and after the visit in records review, medical decision making, counseling pertinent to today's visit, and charting.  ?Disc PTSD and night terrors. Recommend increasing Seroquel, should help both. She understands she's on 2 different AAP which increases risk of movement d/o, however, goal is not to stay on both permanently. Will wean off one or the other after she's stable.  ?Will extend OOW, she is in no way able to perform her duties right now.  ? ?Cont Xanax 1 mg, 1/2-1 p.o. twice daily as needed.   ?Increase Seroquel 100 mg, to 2 po qhs.  ?Continue Risperdal 4 mg p.o. nightly. ? ?Out of work extended: 10/22/21-12/27/2021.  Note hand written on a prescription pad stating that she will be out of work beginning 11/22/2021 until 12/27/2021, given to her.   (Patient states she needs the date starting 11/22/2021 as that was the original back to work date.) ? ?Cont intermittent FMLA needs to be filled out once again, 3 days per month Intermittent FMLA. 8 hours each time. And 2 appts at 4 hours each per month. ?Return in 4 weeks.  ? ?Donnal Moat, PA-C  ?

## 2021-11-22 ENCOUNTER — Ambulatory Visit: Payer: BC Managed Care – PPO | Admitting: Infectious Diseases

## 2021-11-22 ENCOUNTER — Other Ambulatory Visit: Payer: BC Managed Care – PPO

## 2021-11-24 ENCOUNTER — Telehealth: Payer: Self-pay | Admitting: Physician Assistant

## 2021-11-24 NOTE — Telephone Encounter (Signed)
Received fax from [facility] regarding Laura Rogers. Completion needed for an FMLA form. Placed on Traci's desk for completion.  ?

## 2021-11-25 ENCOUNTER — Other Ambulatory Visit (HOSPITAL_COMMUNITY)
Admission: RE | Admit: 2021-11-25 | Discharge: 2021-11-25 | Disposition: A | Discharge status: Home / Self Care | Payer: BC Managed Care – PPO | Source: Ambulatory Visit | Attending: Infectious Diseases | Admitting: Infectious Diseases

## 2021-11-25 ENCOUNTER — Other Ambulatory Visit: Payer: Self-pay

## 2021-11-25 ENCOUNTER — Other Ambulatory Visit: Payer: BC Managed Care – PPO

## 2021-11-25 ENCOUNTER — Ambulatory Visit (INDEPENDENT_AMBULATORY_CARE_PROVIDER_SITE_OTHER): Payer: BC Managed Care – PPO | Admitting: Infectious Diseases

## 2021-11-25 ENCOUNTER — Other Ambulatory Visit: Payer: Self-pay | Admitting: Infectious Diseases

## 2021-11-25 ENCOUNTER — Encounter: Payer: Self-pay | Admitting: Infectious Diseases

## 2021-11-25 VITALS — BP 110/72 | HR 84 | Temp 98.4°F | Resp 16 | Ht 62.0 in | Wt 128.8 lb

## 2021-11-25 DIAGNOSIS — B20 Human immunodeficiency virus [HIV] disease: Secondary | ICD-10-CM | POA: Diagnosis not present

## 2021-11-25 DIAGNOSIS — Z124 Encounter for screening for malignant neoplasm of cervix: Secondary | ICD-10-CM

## 2021-11-25 NOTE — Progress Notes (Signed)
? ?  ? ?  Subjective :  ? ? Laura Rogers is a 46 y.o. female here for an annual pelvic exam and pap smear.  ? ? ?Review of Systems:  ?Current GYN complaints or concerns: none ? ?Patient denies any abdominal/pelvic pain, problems with bowel movements, urination, vaginal discharge or intercourse.  ? ? ?Past Medical History:  ?Diagnosis Date  ? HIV infection ([place])   ? ? ?Gynecologic History: ?G1P0010  ?No LMP recorded. ?Contraception: condoms ?Last Pap: 2017. Results were: normal ?Anal Intercourse:  ?Last Mammogram: none  ? ? ? ?Objective :   ?Physical Exam - chaperone present  ?Constitutional: Well developed, well nourished, no acute distress. She is alert and oriented x3.  ?Pelvic: External genitalia is normal in appearance. The vagina is normal in appearance. The cervix is bulbous and easily visualized. No CMT, normal expected cervical mucus present. Milky thin vaginal discharge mild/moderate amount. Breasts: symmetrical in contour, shape and texture. No palpable masses/nodules. No nipple discharge.  ?Psych: She has a normal mood and affect.   ? ? ? ?Assessment & Plan:   ? ?Patient Active Problem List  ? Diagnosis Date Noted  ? Screening for cervical cancer 11/25/2021  ? Dental infection 11/11/2021  ? Generalized anxiety disorder 01/02/2021  ? Nausea 03/05/2019  ? Insomnia 12/14/2018  ? Seasonal allergies 12/14/2018  ? Bipolar affective ([place]) 07/20/2017  ? Migraine without aura and without status migrainosus, not intractable 06/03/2015  ? CIN I (cervical intraepithelial neoplasia I) 05/04/2015  ? Migraine 04/06/2015  ? Herpes genitalia 04/09/2014  ? Other and unspecified hyperlipidemia 12/05/2012  ? Constipation 04/11/2011  ? HIV (human immunodeficiency virus infection) ([place]) 04/06/2011  ? ? ?Problem List Items Addressed This Visit   ? ?  ? Unprioritized  ? Screening for cervical cancer - Primary  ?  Normal pelvic exam - she has a little milky vaginal discharge present - no symptoms and recently transitioned off  period. Suspect BV. Will see what cytology shows.  ?Cervical brushing collected for cytology and HPV.  ?Discussed recommended screening interval for women living with HIV disease meant to be lifelong and at an interval of Q1-3 years pending results. Acceptable to space out Q3y with 3 consecutively normal exams. Further recommendations for BRYLEY CHRISMAN to follow today's results.  ?Results will be communicated to the patient via mychart ?  ?  ? Relevant Orders  ? Cytology - PAP( [place])  ?  ? ?Janene Madeira, MSN, NP-C ?[facility] ?[facility] ?Office: [phone] ?Pager: [phone] ? ?11/25/21 ?2:31 PM ?   ? ?

## 2021-11-25 NOTE — Assessment & Plan Note (Signed)
Normal pelvic exam - she has a little milky vaginal discharge present - no symptoms and recently transitioned off period. Suspect BV. Will see what cytology shows.  ?Cervical brushing collected for cytology and HPV.  ?Discussed recommended screening interval for women living with HIV disease meant to be lifelong and at an interval of Q1-3 years pending results. Acceptable to space out Q3y with 3 consecutively normal exams. Further recommendations for Laura Rogers to follow today's results.  ?Results will be communicated to the patient via mychart ?

## 2021-11-29 LAB — CYTOLOGY - PAP
Chlamydia: NEGATIVE
Comment: NEGATIVE
Comment: NEGATIVE
Comment: NORMAL
High risk HPV: NEGATIVE
Neisseria Gonorrhea: NEGATIVE

## 2021-11-30 NOTE — Progress Notes (Signed)
LSIL on pap smear with (-) HPV.  ?Will repeat cytology with HPV co-testing in 1 year with referral for colposcopy if persistent finding.  ?Patient notified via mychart

## 2021-12-01 DIAGNOSIS — Z0289 Encounter for other administrative examinations: Secondary | ICD-10-CM

## 2021-12-01 LAB — HIV-1 RNA ULTRAQUANT REFLEX TO GENTYP+
HIV 1 RNA Quant: 85 copies/mL — ABNORMAL HIGH
HIV-1 RNA Quant, Log: 1.93 Log copies/mL — ABNORMAL HIGH

## 2021-12-01 NOTE — Telephone Encounter (Signed)
Paper work completed and given to [place] for review and signature ?

## 2021-12-02 ENCOUNTER — Ambulatory Visit (INDEPENDENT_AMBULATORY_CARE_PROVIDER_SITE_OTHER): Payer: BC Managed Care – PPO | Admitting: Infectious Diseases

## 2021-12-02 ENCOUNTER — Encounter: Payer: Self-pay | Admitting: Infectious Diseases

## 2021-12-02 ENCOUNTER — Other Ambulatory Visit: Payer: Self-pay

## 2021-12-02 ENCOUNTER — Telehealth: Payer: Self-pay

## 2021-12-02 DIAGNOSIS — N870 Mild cervical dysplasia: Secondary | ICD-10-CM

## 2021-12-02 DIAGNOSIS — B20 Human immunodeficiency virus [HIV] disease: Secondary | ICD-10-CM | POA: Diagnosis not present

## 2021-12-02 MED ORDER — DOLUTEGRAVIR-LAMIVUDINE 50-300 MG PO TABS: 1.0000 | ORAL_TABLET | Freq: Every day | ORAL | 6 refills | Status: DC

## 2021-12-02 NOTE — Telephone Encounter (Signed)
[facility] Patient Advocate Encounter ?  ?Was successful in obtaining a Viiv copay card for Dovato.  This copay card will make the patients copay $0.00. ? ?I have spoken with the patient.   ? ? ? ? ? ? ?Ileene Patrick, CPhT ?Specialty Pharmacy Patient Advocate ?[facility] ?Phone: [phone] ?Fax:  [phone]  ?

## 2021-12-02 NOTE — Assessment & Plan Note (Signed)
We will switch her to Dovato ?Given 1 month free coupon.  ?Offered/refused condoms.  ?Will see her back in 6 weeks to recheck her HIV RNA and med tolerance.  ?

## 2021-12-02 NOTE — Progress Notes (Signed)
? ?  Subjective:  ? ? Patient ID: Laura Rogers, female  DOB: 01-Sep-1976, 46 y.o.        MRN: [id number] ? ? ?HPI ?46 yo F with HIV+, depression. Also with hx of cluster headaches.   ?Genvoya --> odefsy -->  Tivicay/descovy --> off --> biktravy. At her visit (12-2018) she was changed back to [facility]. Each of them made her nauseous.  ?  ?Also hx of CIN1, had colpo 04-2015. Negative.  ?Had PAP negative 11-2015. Needs apt for repeat- still pending. .  ?  ?Has been seeing psych- for biploar, taking riplan and xanax.  ?  ?Last CD4 215.  ? ?PAP earlier this month LGSIL, rtc in 1 year.  ?Tooth has been removed, mouth feels better.  ?Feels like odefsy is making her jittery. She has seen ads for dovato and is curious.  ?Last CD4 215.  ?Still being held out of work.  ? ?HIV 1 RNA Quant  ?Date Value  ?11/25/2021 85 copies/mL (H)  ?10/29/2021 8,820 Copies/mL (H)  ?09/29/2020 <20 Copies/mL (H)  ? ?CD4 T Cell Abs (/uL)  ?Date Value  ?09/29/2020 421  ?02/20/2019 325 (L)  ?11/29/2018 290 (L)  ? ? ? ?Health Maintenance  ?Topic Date Due  ?? COVID-19 Vaccine (1) Never done  ?? TETANUS/TDAP  Never done  ?? INFLUENZA VACCINE  04/12/2021  ?? PAP SMEAR-Modifier  11/26/2022  ?? Hepatitis C Screening  Completed  ?? HIV Screening  Completed  ?? HPV VACCINES  Aged Out  ? ? ? ? ?Review of Systems  ?Constitutional:  Negative for weight loss.  ?Respiratory:  Negative for cough and shortness of breath.   ?Gastrointestinal:  Negative for constipation and diarrhea.  ?Genitourinary:  Negative for dysuria.  ?Psychiatric/Behavioral:  The patient is nervous/anxious.   ? ?Please see HPI. All other systems reviewed and negative. ? ?   ?Objective:  ?Physical Exam ?Vitals reviewed.  ?Constitutional:   ?   Appearance: She is normal weight.  ?HENT:  ?   Mouth/Throat:  ?   Mouth: Mucous membranes are moist.  ?   Pharynx: No oropharyngeal exudate.  ?Eyes:  ?   Extraocular Movements: Extraocular movements intact.  ?   Pupils: Pupils are equal, round, and reactive to  light.  ?Cardiovascular:  ?   Rate and Rhythm: Normal rate and regular rhythm.  ?Pulmonary:  ?   Effort: Pulmonary effort is normal.  ?   Breath sounds: Normal breath sounds.  ?Abdominal:  ?   General: Bowel sounds are normal. There is no distension.  ?   Palpations: Abdomen is soft.  ?   Tenderness: There is no abdominal tenderness.  ?Musculoskeletal:     ?   General: Normal range of motion.  ?   Cervical back: Normal range of motion and neck supple.  ?   Right lower leg: No edema.  ?   Left lower leg: No edema.  ?Neurological:  ?   General: No focal deficit present.  ?   Mental Status: She is alert.  ?Psychiatric:     ?   Attention and Perception: Attention normal.     ?   Mood and Affect: Mood is anxious.  ? ? ? ? ? ?   ?Assessment & Plan:  ? ?

## 2021-12-02 NOTE — Assessment & Plan Note (Signed)
Appreciate Ms Dixon's outstanding care.  ?rtc in 1 yr.  ?

## 2021-12-03 ENCOUNTER — Telehealth: Payer: Self-pay | Admitting: Physician Assistant

## 2021-12-03 NOTE — Telephone Encounter (Signed)
Disability form and records were completed, signed and faxed to the [facility]. ?

## 2021-12-22 ENCOUNTER — Ambulatory Visit (INDEPENDENT_AMBULATORY_CARE_PROVIDER_SITE_OTHER): Payer: BC Managed Care – PPO | Admitting: Physician Assistant

## 2021-12-22 ENCOUNTER — Encounter: Payer: Self-pay | Admitting: Physician Assistant

## 2021-12-22 DIAGNOSIS — F514 Sleep terrors [night terrors]: Secondary | ICD-10-CM

## 2021-12-22 DIAGNOSIS — F319 Bipolar disorder, unspecified: Secondary | ICD-10-CM | POA: Diagnosis not present

## 2021-12-22 DIAGNOSIS — F4310 Post-traumatic stress disorder, unspecified: Secondary | ICD-10-CM | POA: Diagnosis not present

## 2021-12-22 DIAGNOSIS — F411 Generalized anxiety disorder: Secondary | ICD-10-CM | POA: Diagnosis not present

## 2021-12-22 MED ORDER — QUETIAPINE FUMARATE 200 MG PO TABS: 200.0000 mg | ORAL_TABLET | Freq: Every day | ORAL | 0 refills | Status: DC

## 2021-12-22 MED ORDER — ALPRAZOLAM 1 MG PO TABS: 1.0000 mg | ORAL_TABLET | Freq: Two times a day (BID) | ORAL | 2 refills | Status: DC | PRN

## 2021-12-22 NOTE — Progress Notes (Signed)
[facility] ? ?Patient ID: Laura Rogers,  ?MRN: [id number] ? ?PCP: Pcp, No ? ?Date of Evaluation: 12/22/2021 ?time spent:30 minutes ? ?Chief Complaint:  ?Chief Complaint   ?Anxiety; Depression; Insomnia; Follow-up ?  ? ? ?HISTORY/CURRENT STATUS: ?HPI For routine f/u. ? ?Is doing slightly better.  Still anxious about what happened with the allergic reaction to the antibiotic the dentist gave her.  Has been out of work because of the trauma, anxiety, depression associated with it.  Not having nightmares though and is sleeping pretty well.  She can get panicky when thinking about having to go to the hospital because of the allergic reaction.  Xanax does help when needed.  Not as depressed as she was.  She and her boyfriend are splitting up.  That does make her sad but feels like it is time to go their separate ways.  Still does not have a lot of energy but it is better than it was.  Not isolating, not crying easily.  ADLs and personal hygiene are normal.  No suicidal or homicidal thoughts. ? ?Patient denies increased energy with decreased need for sleep, no increased talkativeness, no racing thoughts, no impulsivity or risky behaviors, no increased spending, no increased libido, no grandiosity, no increased irritability or anger, no paranoia, and no hallucinations. ? ?She is very concerned about the security of her job though and has to go back.  States she received a letter from [employer] saying that she has been approved for return date 01/04/2022. ? ?Denies dizziness, syncope, seizures, numbness, tingling, tremor, tics, unsteady gait, slurred speech, confusion. Denies muscle or joint pain, stiffness, or dystonia. ? ?Individual Medical History/ Review of Systems: Changes? :No    ? ?Past medications for mental health diagnoses include: ?Risperdal, Zoloft, Restoril, trazodone not effective, Xanax, BuSpar was not effective, Cymbalta ineffective, propranolol ineffective. ? ?Allergies: Amoxicillin,  Levofloxacin, Valacyclovir hcl, Sulfamethoxazole-trimethoprim, Levofloxacin, Penicillins, and Bactrim ? ?Current Medications:  ?Current Outpatient Medications:  ?  dolutegravir-lamiVUDine (DOVATO) 50-300 MG tablet, Take 1 tablet by mouth daily., Disp: 30 tablet, Rfl: 6 ?  EPINEPHrine (EPIPEN 2-PAK) 0.3 mg/0.3 mL IJ SOAJ injection, Inject 0.3 mg into the muscle as needed for anaphylaxis., Disp: 1 each, Rfl: 0 ?  ibuprofen (ADVIL) 800 MG tablet, Take 1 tablet (800 mg total) by mouth every 8 (eight) hours as needed for moderate pain., Disp: 30 tablet, Rfl: 0 ?  risperidone (RISPERDAL) 4 MG tablet, Take 1 tablet (4 mg total) by mouth daily., Disp: 90 tablet, Rfl: 0 ?  ALPRAZolam (XANAX) 1 MG tablet, Take 1 tablet (1 mg total) by mouth 2 (two) times daily as needed for anxiety., Disp: 60 tablet, Rfl: 2 ?  QUEtiapine (SEROQUEL) 200 MG tablet, Take 1 tablet (200 mg total) by mouth at bedtime., Disp: 90 tablet, Rfl: 0 ?Medication Side Effects: none ? ?Family Medical/ Social History: Changes?  She and BF are breaking up.  ? ? ?MENTAL HEALTH EXAM: ? ?There were no vitals taken for this visit.There is no height or weight on file to calculate BMI.  ?General Appearance: Casual, Guarded and Well Groomed  ?Eye Contact:  Good  ?Speech:  Clear and Coherent and Normal Rate  ?Volume:  Normal  ?Mood:  Euthymic  ?Affect:  Flat  ?Thought Process:  Goal Directed and Descriptions of Associations: Circumstantial  ?Orientation:  Full (Time, Place, and Person)  ?Thought Content: Logical   ?Suicidal Thoughts:  No  ?Homicidal Thoughts:  No  ?Memory:  WNL  ?Judgement:  Good  ?Insight:  Good  ?Psychomotor Activity:  Normal  ?Concentration:  Concentration: Fair and Attention Span: Fair  ?Recall:  Good  ?Fund of Knowledge: Good  ?Language: Good  ?Assets:  Desire for Improvement ?Financial Resources/Insurance ?Housing ?Transportation ?Vocational/Educational  ?ADL's:  Intact  ?Cognition: WNL  ?Prognosis:  Good  ? ? ? ?DIAGNOSES:  ?  ICD-10-CM   ?1.  PTSD (post-traumatic stress disorder)  F43.10   ?  ?2. Bipolar I disorder ([place])  F31.9   ?  ?3. Night terrors, adult  F51.4   ?  ?4. Generalized anxiety disorder  F41.1   ?  ? ? ? ?Receiving Psychotherapy: No  ? ? ?RECOMMENDATIONS:  ?PDMP was reviewed.  Last Xanax filled 12/03/2021.   ?I provided 30 minutes of face to face time during this encounter, including time spent before and after the visit in records review, medical decision making, counseling pertinent to today's visit, and charting.  ?Glad she is feeling some better.  I had originally written her out of work going back on 12/27/2021.  I am unable to find paperwork showing a return on 01/04/2022.  I am discussing that with our nurse Dory Larsen, LPN who handles those forms.  Either way is fine with me I just want documented appropriately. ?She understands that being on 2 different antipsychotics may increase the risk of movement disorders.  It may be appropriate to DC 1 or the other in the future but right now I do not recommend any changes be made.  She agrees. ? ?Cont Xanax 1 mg, 1/2-1 p.o. twice daily as needed.   ?Continue Seroquel 100 mg, 2 po qhs.  ?Continue Risperdal 4 mg p.o. nightly. ?Cont intermittent FMLA needs to be filled out once again, 3 days per month Intermittent FMLA. 8 hours each time. And 2 appts at 4 hours each per month. ?Return in 3 months. ? ?Donnal Moat, PA-C  ?

## 2022-01-12 ENCOUNTER — Telehealth: Payer: Self-pay

## 2022-01-12 DIAGNOSIS — Z20822 Contact with and (suspected) exposure to covid-19: Secondary | ICD-10-CM | POA: Diagnosis not present

## 2022-01-12 NOTE — Telephone Encounter (Signed)
Patient contacted office to request tessalon pearls for cough. Patient states she believes she may have covid-19 again due to presisitant cough for the past 2 days, no fevers, fatigue. ? ?Patient has outside lab scheduled for covid testing ?[facility] ?Date and time lab scheduled 01/13/2022 at 9:45 ? ?Patient agrees to [place] visit with Dr. Johnnye Sima 01/13/22. ? ?Advised patient if she starts to feel like symptoms are getting worse to visit urgent care or ED. Patient also advised to increase fluid intake and stay hydrated. Stay home and stay isolated pending results and mask up when going to be tested on Friday. Patient verbalized understanding. Routing to provider to make aware. ? ?Laura Rogers ? ? ?

## 2022-01-14 ENCOUNTER — Other Ambulatory Visit: Payer: Self-pay

## 2022-01-14 ENCOUNTER — Telehealth (INDEPENDENT_AMBULATORY_CARE_PROVIDER_SITE_OTHER): Payer: BC Managed Care – PPO | Admitting: Infectious Diseases

## 2022-01-14 ENCOUNTER — Other Ambulatory Visit: Payer: Self-pay | Admitting: Infectious Diseases

## 2022-01-14 DIAGNOSIS — J40 Bronchitis, not specified as acute or chronic: Secondary | ICD-10-CM | POA: Insufficient documentation

## 2022-01-14 DIAGNOSIS — B20 Human immunodeficiency virus [HIV] disease: Secondary | ICD-10-CM | POA: Diagnosis not present

## 2022-01-14 DIAGNOSIS — J410 Simple chronic bronchitis: Secondary | ICD-10-CM

## 2022-01-14 DIAGNOSIS — J42 Unspecified chronic bronchitis: Secondary | ICD-10-CM

## 2022-01-14 MED ORDER — ODEFSEY 200-25-25 MG PO TABS: 1.0000 | ORAL_TABLET | Freq: Every day | ORAL | 3 refills | Status: DC

## 2022-01-14 MED ORDER — BENZONATATE 100 MG PO CAPS: 100.0000 mg | ORAL_CAPSULE | Freq: Three times a day (TID) | ORAL | 0 refills | Status: DC | PRN

## 2022-01-14 MED ORDER — ALBUTEROL SULFATE HFA 108 (90 BASE) MCG/ACT IN AERS: 2.0000 | INHALATION_SPRAY | Freq: Four times a day (QID) | RESPIRATORY_TRACT | 6 refills | Status: DC | PRN

## 2022-01-14 MED ORDER — AZITHROMYCIN 250 MG PO TABS: ORAL_TABLET | ORAL | 0 refills | Status: DC

## 2022-01-14 NOTE — Assessment & Plan Note (Signed)
Will refill her albuterol ?rx for azithro ?rx for tessalon ?Asked her to go to hospital for CXR if she does not feel better.  ? ?Will see her in clinic next  ?

## 2022-01-14 NOTE — Progress Notes (Signed)
? ?  Subjective:  ? ? Patient ID: Laura Rogers, female  DOB: Feb 05, 1976, 46 y.o.        MRN: [id number] ? ? ?HPI ?46 yo F with HIV+, depression. Also with hx of cluster headaches.   ?Genvoya --> odefsy -->  Tivicay/descovy --> off --> biktravy. At her visit (12-2018) she was changed back to [facility]. Each of them made her nauseous.  ?  ?Also hx of CIN1, had colpo 04-2015. Negative.  ?Had PAP negative 11-2015. Needs apt for repeat- still pending. .  ?  ?Has been seeing psych- for biploar, taking riplan and xanax.  ?  ?At her last visit (01-2021) she had severe constipation. She was given otcs.Is now moving bowels Qam.  ?She had adr to amox after seen by dentist. She was seen in ED. States she now has PTSD.  ?She is having oral surgery 11-18-21.  ?Her mental health has been worse since then. Can't sleep (thoughts of being in ER, dentist, financial stress). See's her therapist next week,  currently being held out of work.  ?Denies missed ART(odefsy). CD4 215.  ? ?Today she c/o of chills, hot and cold. Cough. Congestion, pleuritic pain.  ?Was seen at [facility] yesterday and had negative covid test.  ?Feels like when she was first dx. Can't stop coughing.  ?She attributes this to dovato.  ?States she has been back on dapsone. Cough has decreased with taking this.  ?Cough worse at night.  ?Decreased appetite. Can't smoke.   ? ?HIV 1 RNA Quant  ?Date Value  ?11/25/2021 85 copies/mL (H)  ?10/29/2021 8,820 Copies/mL (H)  ?09/29/2020 <20 Copies/mL (H)  ? ?CD4 T Cell Abs (/uL)  ?Date Value  ?09/29/2020 421  ?02/20/2019 325 (L)  ?11/29/2018 290 (L)  ? ? ? ?Health Maintenance  ?Topic Date Due  ? COVID-19 Vaccine (1) Never done  ? TETANUS/TDAP  Never done  ? COLONOSCOPY (Pts 45-58yr Insurance coverage will need to be confirmed)  Never done  ? INFLUENZA VACCINE  04/12/2022  ? PAP SMEAR-Modifier  11/26/2022  ? Hepatitis C Screening  Completed  ? HIV Screening  Completed  ? HPV VACCINES  Aged Out  ? ? ? ? ?Review of Systems  ?Constitutional:   Positive for chills and malaise/fatigue. Negative for fever.  ?Eyes:  Positive for blurred vision.  ?Respiratory:  Positive for cough, sputum production and shortness of breath.   ?Gastrointestinal:  Negative for constipation and diarrhea.  ?Genitourinary:  Negative for dysuria.  ? ?Please see HPI. All other systems reviewed and negative. ? ?   ?Objective:  ?Physical Exam ?1. Due to the national emergency this service was provided using telemedicine.  Video.  ?2. Consent from the patient for the telehealth visit and that you identified patient name.   ?3. Your locations, Pt and Provider [facility], pt home.   ?4. Chief complaint for visit cough, bronchitis.   ?5. Document anyone else on the call none.   ?6. If the visit was a phone call, that you include the time you spent on the call: 11 minutes.  ? ? ? ? ? ? ?   ?Assessment & Plan:  ? ?

## 2022-01-14 NOTE — Assessment & Plan Note (Signed)
Pt has lost faith in dovato.  ?Will change her back to odefsy.  ?See her in 1 month.  ?

## 2022-01-20 ENCOUNTER — Encounter: Payer: Self-pay | Admitting: Infectious Diseases

## 2022-01-21 ENCOUNTER — Other Ambulatory Visit: Payer: Self-pay

## 2022-01-21 ENCOUNTER — Ambulatory Visit
Admission: RE | Admit: 2022-01-21 | Discharge: 2022-01-21 | Disposition: A | Discharge status: Home / Self Care | Payer: BC Managed Care – PPO | Source: Ambulatory Visit | Attending: Infectious Diseases | Admitting: Infectious Diseases

## 2022-01-21 ENCOUNTER — Encounter: Payer: Self-pay | Admitting: Infectious Diseases

## 2022-01-21 ENCOUNTER — Ambulatory Visit (INDEPENDENT_AMBULATORY_CARE_PROVIDER_SITE_OTHER): Payer: BC Managed Care – PPO | Admitting: Infectious Diseases

## 2022-01-21 DIAGNOSIS — J40 Bronchitis, not specified as acute or chronic: Secondary | ICD-10-CM

## 2022-01-21 DIAGNOSIS — B009 Herpesviral infection, unspecified: Secondary | ICD-10-CM | POA: Diagnosis not present

## 2022-01-21 DIAGNOSIS — R059 Cough, unspecified: Secondary | ICD-10-CM | POA: Diagnosis not present

## 2022-01-21 DIAGNOSIS — R0602 Shortness of breath: Secondary | ICD-10-CM | POA: Diagnosis not present

## 2022-01-21 MED ORDER — PREDNISONE 10 MG (21) PO TBPK: ORAL_TABLET | ORAL | 0 refills | Status: DC

## 2022-01-21 MED ORDER — ALBUTEROL SULFATE HFA 108 (90 BASE) MCG/ACT IN AERS: 2.0000 | INHALATION_SPRAY | Freq: Four times a day (QID) | RESPIRATORY_TRACT | 6 refills | Status: AC | PRN

## 2022-01-21 NOTE — Assessment & Plan Note (Signed)
Will give her short burst of steroids.  ?Albuterol inh ?Check CXR ?

## 2022-01-21 NOTE — Assessment & Plan Note (Addendum)
Allergic to valtrex.  ?OTC treatment.  ?

## 2022-01-21 NOTE — Progress Notes (Signed)
? ?  Subjective:  ? ? Patient ID: Laura Rogers, female  DOB: [birth year], 46 y.o.        MRN: [id number] ? ? ?HPI ?46 yo F with HIV+, depression. Also with hx of cluster headaches.   ?Genvoya --> odefsy -->  Tivicay/descovy --> off --> biktravy. At her visit (12-2018) she was changed back to [facility]. Each of them made her nauseous.  ?  ?Also hx of CIN1, had colpo 04-2015. Negative.  ?Had PAP negative 11-2015. Needs apt for repeat- still pending. .  ?  ?Has been seeing psych- for biploar, taking riplan and xanax.  ? ?Was seen by video visit ~ 2 weeks ago for bronchitis. Was given rx for azithro.  ?She does not feel better.  ?Now has "blisters all over my face".  ?She "cant smoke" (weed) which decreases her appetite. Still making phlegm, having hot/cold episodes. Coughing, feels congested.  ?Decreased appetite.  ?Has taken covid test x 2 (-). ? ? ?HIV 1 RNA Quant  ?Date Value  ?11/25/2021 85 copies/mL (H)  ?10/29/2021 8,820 Copies/mL (H)  ?09/29/2020 <20 Copies/mL (H)  ? ?CD4 T Cell Abs (/uL)  ?Date Value  ?09/29/2020 421  ?02/20/2019 325 (L)  ?11/29/2018 290 (L)  ? ? ? ?Health Maintenance  ?Topic Date Due  ?? COVID-19 Vaccine (1) Never done  ?? TETANUS/TDAP  Never done  ?? COLONOSCOPY (Pts 45-34yr Insurance coverage will need to be confirmed)  Never done  ?? INFLUENZA VACCINE  04/12/2022  ?? PAP SMEAR-Modifier  11/26/2022  ?? Hepatitis C Screening  Completed  ?? HIV Screening  Completed  ?? HPV VACCINES  Aged Out  ? ? ? ? ?Review of Systems  ?Constitutional:  Positive for chills, malaise/fatigue and weight loss. Negative for fever.  ?HENT:  Positive for congestion.   ?Respiratory:  Positive for cough and sputum production. Negative for shortness of breath and wheezing.   ?Gastrointestinal:  Negative for constipation and diarrhea.  ?Genitourinary:  Negative for dysuria.  ? ?Please see HPI. All other systems reviewed and negative. ? ?   ?Objective:  ?Physical Exam ?Vitals reviewed.  ?Constitutional:   ?   General: She is  not in acute distress. ?   Appearance: Normal appearance. She is normal weight. She is not ill-appearing or toxic-appearing.  ?HENT:  ?   Mouth/Throat:  ?   Mouth: Mucous membranes are moist. Oral lesions (small blisters on lips (~ 116m) present.  ?   Pharynx: No oropharyngeal exudate.  ?Eyes:  ?   Extraocular Movements: Extraocular movements intact.  ?   Pupils: Pupils are equal, round, and reactive to light.  ?Cardiovascular:  ?   Rate and Rhythm: Normal rate and regular rhythm.  ?Pulmonary:  ?   Effort: Pulmonary effort is normal.  ?   Breath sounds: Rhonchi (mild) present.  ?Abdominal:  ?   General: Bowel sounds are normal. There is no distension.  ?   Palpations: Abdomen is soft.  ?   Tenderness: There is no abdominal tenderness.  ?Musculoskeletal:     ?   General: Normal range of motion.  ?   Cervical back: Normal range of motion and neck supple.  ?Skin: ?   General: Skin is warm and dry.  ?Neurological:  ?   General: No focal deficit present.  ?   Mental Status: She is alert.  ? ? ? ? ? ? ?   ?Assessment & Plan:  ? ?

## 2022-01-25 ENCOUNTER — Telehealth: Payer: Self-pay | Admitting: Infectious Diseases

## 2022-01-25 NOTE — Telephone Encounter (Signed)
Called pt and let her know that her CXR is clear.  ?She feels much better.  ? ?

## 2022-02-03 ENCOUNTER — Encounter: Payer: Self-pay | Admitting: Infectious Diseases

## 2022-02-11 ENCOUNTER — Encounter: Payer: Self-pay | Admitting: Physician Assistant

## 2022-02-11 ENCOUNTER — Ambulatory Visit (INDEPENDENT_AMBULATORY_CARE_PROVIDER_SITE_OTHER): Payer: BC Managed Care – PPO | Admitting: Infectious Diseases

## 2022-02-11 ENCOUNTER — Other Ambulatory Visit: Payer: Self-pay

## 2022-02-11 ENCOUNTER — Ambulatory Visit (INDEPENDENT_AMBULATORY_CARE_PROVIDER_SITE_OTHER): Payer: BC Managed Care – PPO | Admitting: Physician Assistant

## 2022-02-11 ENCOUNTER — Encounter: Payer: Self-pay | Admitting: Infectious Diseases

## 2022-02-11 ENCOUNTER — Ambulatory Visit: Payer: BC Managed Care – PPO | Admitting: Infectious Diseases

## 2022-02-11 VITALS — BP 116/73 | HR 85 | Temp 98.3°F | Ht 61.0 in | Wt 132.0 lb

## 2022-02-11 DIAGNOSIS — F411 Generalized anxiety disorder: Secondary | ICD-10-CM

## 2022-02-11 DIAGNOSIS — F319 Bipolar disorder, unspecified: Secondary | ICD-10-CM | POA: Diagnosis not present

## 2022-02-11 DIAGNOSIS — Z79899 Other long term (current) drug therapy: Secondary | ICD-10-CM

## 2022-02-11 DIAGNOSIS — J40 Bronchitis, not specified as acute or chronic: Secondary | ICD-10-CM | POA: Diagnosis not present

## 2022-02-11 DIAGNOSIS — J302 Other seasonal allergic rhinitis: Secondary | ICD-10-CM

## 2022-02-11 DIAGNOSIS — F4310 Post-traumatic stress disorder, unspecified: Secondary | ICD-10-CM | POA: Diagnosis not present

## 2022-02-11 DIAGNOSIS — N870 Mild cervical dysplasia: Secondary | ICD-10-CM

## 2022-02-11 DIAGNOSIS — B20 Human immunodeficiency virus [HIV] disease: Secondary | ICD-10-CM

## 2022-02-11 DIAGNOSIS — Z113 Encounter for screening for infections with a predominantly sexual mode of transmission: Secondary | ICD-10-CM | POA: Diagnosis not present

## 2022-02-11 MED ORDER — RISPERIDONE 4 MG PO TABS
4.0000 mg | ORAL_TABLET | Freq: Every day | ORAL | 0 refills | Status: DC
Start: 2022-02-11 — End: 2022-07-18

## 2022-02-11 NOTE — Progress Notes (Signed)
[facility]  Patient ID: Laura Rogers,  MRN: [id number]  PCP: Pcp, No  Date of Evaluation: 02/11/2022 time spent:20 minutes  Chief Complaint:  Chief Complaint   Anxiety     HISTORY/CURRENT STATUS: HPI For routine f/u for PTSD and nightmares.  She is doing better as far as flashbacks and nightmares go.  She had a few nightmares several weeks ago when she was sick from [place] for the third time, and was understandably anxious but none since then.  Sleep is much more restful.  The main complaint she has is anxiety, the Xanax does help.  She describes having a jittery feeling and she will stand up, pace the floor and then sit back down.  This happens off and on but does seem worse when she is anxious with work.  States her supervisor is not very understanding.  Patient has no abnormal movements including facial or limbs but states she feels shaky on the inside.  Is back at work now, working from home. Went back 01/05/2022. Then had covid and out of work 5/2-5/10 per Dr. Johnnye Sima.  She has an appointment this afternoon with him for follow-up.  Is trying to get out and do things with her friends some now.  Is more able to enjoy things.  Energy and motivation have improved.  ADLs and personal hygiene are normal.  Appetite and weight are stable.  No suicidal or homicidal thoughts.  Patient denies increased energy with decreased need for sleep, no increased talkativeness, no racing thoughts, no impulsivity or risky behaviors, no increased spending, no increased libido, no grandiosity, no increased irritability or anger, no paranoia, and no hallucinations.  Denies dizziness, syncope, seizures, numbness, tingling, tremor, tics, unsteady gait, slurred speech, confusion. Denies muscle or joint pain, stiffness, or dystonia.  No alcohol use now except on rare occasions, less than monthly and even then only has 1 drink.  Individual Medical History/ Review of Systems: Changes? :Yes    Covid a  few weeks ago.  Past medications for mental health diagnoses include: Risperdal, Zoloft, Restoril, trazodone not effective, Xanax, BuSpar was not effective, Cymbalta ineffective, propranolol ineffective.  Allergies: Amoxicillin, Levofloxacin, Valacyclovir hcl, Sulfamethoxazole-trimethoprim, Levofloxacin, Penicillins, and Bactrim  Current Medications:  Current Outpatient Medications:    albuterol (VENTOLIN HFA) 108 (90 Base) MCG/ACT inhaler, Inhale 2 puffs into the lungs every 6 (six) hours as needed for wheezing or shortness of breath., Disp: 8 g, Rfl: 6   ALPRAZolam (XANAX) 1 MG tablet, Take 1 tablet (1 mg total) by mouth 2 (two) times daily as needed for anxiety., Disp: 60 tablet, Rfl: 2   emtricitabine-rilpivir-tenofovir AF (ODEFSEY) 200-25-25 MG TABS tablet, Take 1 tablet by mouth daily with breakfast., Disp: 90 tablet, Rfl: 3   EPINEPHrine (EPIPEN 2-PAK) 0.3 mg/0.3 mL IJ SOAJ injection, Inject 0.3 mg into the muscle as needed for anaphylaxis., Disp: 1 each, Rfl: 0   QUEtiapine (SEROQUEL) 200 MG tablet, Take 1 tablet (200 mg total) by mouth at bedtime., Disp: 90 tablet, Rfl: 0   azithromycin (ZITHROMAX) 250 MG tablet, Take 2 tabs on day, then 1 tab on days on days 2-5 (Patient not taking: Reported on 01/21/2022), Disp: 6 each, Rfl: 0   benzonatate (TESSALON PERLES) 100 MG capsule, Take 1 capsule (100 mg total) by mouth 3 (three) times daily as needed for cough. (Patient not taking: Reported on 02/11/2022), Disp: 20 capsule, Rfl: 0   predniSONE (STERAPRED UNI-PAK 21 TAB) 10 MG (21) TBPK tablet, 5 tabs on day 1,  4 tabs on day 2, 3 tabs on day 3, 2 tabs on day 4, 1 tab on day 5 through 7 (Patient not taking: Reported on 02/11/2022), Disp: 1 each, Rfl: 0   risperidone (RISPERDAL) 4 MG tablet, Take 1 tablet (4 mg total) by mouth daily., Disp: 90 tablet, Rfl: 0 Medication Side Effects: none  Family Medical/ Social History: Changes?  See HPI.   MENTAL HEALTH EXAM:  There were no vitals taken for  this visit.There is no height or weight on file to calculate BMI.  General Appearance: Casual and Well Groomed  Eye Contact:  Good  Speech:  Clear and Coherent and Normal Rate  Volume:  Normal  Mood:  Euthymic  Affect:  Congruent  Thought Process:  Goal Directed and Descriptions of Associations: Circumstantial  Orientation:  Full (Time, Place, and Person)  Thought Content: Logical   Suicidal Thoughts:  No  Homicidal Thoughts:  No  Memory:  WNL  Judgement:  Good  Insight:  Good  Psychomotor Activity:  Normal  Concentration:  Concentration: Good and Attention Span: Good  Recall:  Good  Fund of Knowledge: Good  Language: Good  Assets:  Desire for Improvement Financial Resources/Insurance Housing Transportation Vocational/Educational  ADL's:  Intact  Cognition: WNL  Prognosis:  Good   DIAGNOSES:    ICD-10-CM   1. Bipolar I disorder ([place])  F31.9     2. Generalized anxiety disorder  F41.1     3. PTSD (post-traumatic stress disorder)  F43.10      Receiving Psychotherapy: No    RECOMMENDATIONS:  PDMP was reviewed.  Last Xanax filled 12/03/2021.   I provided 20 minutes of face to face time during this encounter, including time spent before and after the visit in records review, medical decision making, counseling pertinent to today's visit, and charting.  We discussed her symptoms.  This could be anxiety but it could also be mild akathisia.  I explained the reason for having her on 2 separate antipsychotics, Seroquel is more commonly used for PTSD and nightmares so that was added back in February for those reasons.  She was already on the Risperdal and had been managed well on that for several years.  Since she is doing better now I recommend weaning off the Seroquel and hopefully that internal jittery feeling will go away.  The Xanax does help so she will continue that.  If it does not improve after going off the Seroquel, she should let me know.  She verbalizes  understanding.  Wean off Seroquel 200 mg by taking 1/2 pill nightly for 4 days and then stop. Cont Xanax 1 mg, 1/2-1 p.o. twice daily as needed.   Continue Risperdal 4 mg p.o. nightly. Cont intermittent FMLA needs to be filled out once again, 3 days per month Intermittent FMLA. 8 hours each time. And 2 appts at 4 hours each per month. Must work from home.  Return in 6 weeks.   Donnal Moat, PA-C

## 2022-02-11 NOTE — Assessment & Plan Note (Signed)
Appreciate psych f/u She has some sx now as her meds are adjusted.

## 2022-02-11 NOTE — Assessment & Plan Note (Signed)
She is doing well Offered/refused condoms.  Will see her back in 3 months and do labs then.  She is tolerating odefsy well after several other meds.

## 2022-02-11 NOTE — Assessment & Plan Note (Signed)
Resolved. She has inhaler as needed.

## 2022-02-11 NOTE — Assessment & Plan Note (Signed)
Appreciate Ms Dixon's f/u.  Repeat in 1 year.

## 2022-02-11 NOTE — Progress Notes (Signed)
Subjective:    Patient ID: Laura Rogers, female  DOB: February 24, 1976, 46 y.o.        MRN: [id number]   HPI 46 yo F with HIV+, depression. Also with hx of cluster headaches.   Genvoya --> odefsy -->  Tivicay/descovy --> off --> biktravy. At her visit (12-2018) she was changed back to [facility]. Each of them made her nauseous.    Also hx of CIN1, had colpo 04-2015. Negative.  Had PAP negative 11-2015. LSIL 11-2021 (repeat 1 year)   Has been seeing psych- for biploar, taking riplan and xanax. Had dose lowered, changed by her mental health person. Has been more jittery. " I'm about to explode inside".   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 Being micro-managed. Working long hours.   Today c/o allergy sx- has been taking zyrtec.   Legal suit with dentist still pending (was given amoxil when she was allergic).   Has been going to gym, being outside.   Feels well on odfesy, no n/v.   HIV 1 RNA Quant  Date Value  11/25/2021 85 copies/mL (H)  10/29/2021 8,820 Copies/mL (H)  09/29/2020 <20 Copies/mL (H)   CD4 T Cell Abs (/uL)  Date Value  09/29/2020 421  02/20/2019 325 (L)  11/29/2018 290 (L)     Health Maintenance  Topic Date Due  . COVID-19 Vaccine (1) Never done  . TETANUS/TDAP  Never done  . COLONOSCOPY (Pts 45-12yr Insurance coverage will need to be confirmed)  Never done  . INFLUENZA VACCINE  04/12/2022  . PAP SMEAR-Modifier  11/26/2022  . Hepatitis C Screening  Completed  . HIV Screening  Completed  . HPV VACCINES  Aged Out      Review of Systems  Constitutional:  Negative for chills, fever and weight loss.  Respiratory:  Negative for cough and shortness of breath.   Gastrointestinal:  Negative for constipation, diarrhea, nausea and vomiting.  Genitourinary:  Negative for dysuria.  Psychiatric/Behavioral:  The patient is nervous/anxious.    Please see HPI. All other systems reviewed and negative.     Objective:  Physical Exam Vitals reviewed.   Constitutional:      Appearance: She is normal weight. She is not ill-appearing or diaphoretic.  HENT:     Mouth/Throat:     Mouth: Mucous membranes are moist.     Pharynx: No oropharyngeal exudate.  Eyes:     Extraocular Movements: Extraocular movements intact.     Pupils: Pupils are equal, round, and reactive to light.  Cardiovascular:     Rate and Rhythm: Normal rate and regular rhythm.  Pulmonary:     Effort: Pulmonary effort is normal.     Breath sounds: Normal breath sounds.  Abdominal:     General: Bowel sounds are normal. There is no distension.     Palpations: Abdomen is soft.     Tenderness: There is no abdominal tenderness.  Musculoskeletal:        General: Normal range of motion.     Cervical back: Normal range of motion. No rigidity.     Right lower leg: No edema.     Left lower leg: No edema.  Lymphadenopathy:     Cervical: No cervical adenopathy.  Neurological:     General: No focal deficit present.     Mental Status: She is alert.  Psychiatric:        Mood and Affect: Mood normal.          Assessment &  Plan:

## 2022-02-11 NOTE — Assessment & Plan Note (Signed)
Currently trying OTC.  Some improvement.

## 2022-03-28 ENCOUNTER — Ambulatory Visit (INDEPENDENT_AMBULATORY_CARE_PROVIDER_SITE_OTHER): Payer: BC Managed Care – PPO | Admitting: Physician Assistant

## 2022-03-28 ENCOUNTER — Encounter: Payer: Self-pay | Admitting: Physician Assistant

## 2022-03-28 DIAGNOSIS — F5105 Insomnia due to other mental disorder: Secondary | ICD-10-CM

## 2022-03-28 DIAGNOSIS — F411 Generalized anxiety disorder: Secondary | ICD-10-CM | POA: Diagnosis not present

## 2022-03-28 DIAGNOSIS — F319 Bipolar disorder, unspecified: Secondary | ICD-10-CM | POA: Diagnosis not present

## 2022-03-28 DIAGNOSIS — F99 Mental disorder, not otherwise specified: Secondary | ICD-10-CM

## 2022-03-28 DIAGNOSIS — F4310 Post-traumatic stress disorder, unspecified: Secondary | ICD-10-CM | POA: Diagnosis not present

## 2022-03-28 NOTE — Progress Notes (Signed)
[facility]  Patient ID: Laura Rogers,  MRN: [id number]  PCP: Pcp, No  Date of Evaluation: 03/28/2022  time spent:20 minutes  Chief Complaint:  Chief Complaint   Anxiety; Insomnia; Follow-up     HISTORY/CURRENT STATUS: HPI f/u med change  We weaned off the Seroquel 6 weeks ago. Not having nightmares like she did. Not falling asleep as well though.  She is working a lot of overtime since she has been out a lot and that is affecting her sleep too.  Her dad has just been diagnosed with prostate cancer so that has been weighing on her mind as well.  Overall she feels like her medications are working.  She is just under a lot of stress.  Another stressor is that she will be going to court sometime soon, a lawyer did take her case concerning the dentist who allegedly prescribed a penicillin which sent her to the ER with anaphylaxis.  So she is nervous about that as well.  She is able to enjoy things though.  Recently went to [place] just by herself to get away for a night.  Energy and motivation are good.  Not isolating.  Appetite and ADLs are normal.  No suicidal or homicidal thoughts.  Patient denies increased energy with decreased need for sleep, no increased talkativeness, no racing thoughts, no impulsivity or risky behaviors, no increased spending, no increased libido, no grandiosity, no increased irritability or anger, no paranoia, and no hallucinations.  Denies dizziness, syncope, seizures, numbness, tingling, tremor, tics, unsteady gait, slurred speech, confusion. Denies muscle or joint pain, stiffness, or dystonia. Denies unexplained weight loss, frequent infections, or sores that heal slowly.  No polyphagia, polydipsia, or polyuria. Denies visual changes or paresthesias.    Individual Medical History/ Review of Systems: Changes? :No      Past medications for mental health diagnoses include: Risperdal, Zoloft, Restoril, trazodone not effective, Xanax, BuSpar was not  effective, Cymbalta ineffective, propranolol ineffective.  Allergies: Amoxicillin, Levofloxacin, Valacyclovir hcl, Sulfamethoxazole-trimethoprim, Levofloxacin, Penicillins, and Bactrim  Current Medications:  Current Outpatient Medications:    albuterol (VENTOLIN HFA) 108 (90 Base) MCG/ACT inhaler, Inhale 2 puffs into the lungs every 6 (six) hours as needed for wheezing or shortness of breath., Disp: 8 g, Rfl: 6   ALPRAZolam (XANAX) 1 MG tablet, Take 1 tablet (1 mg total) by mouth 2 (two) times daily as needed for anxiety., Disp: 60 tablet, Rfl: 2   emtricitabine-rilpivir-tenofovir AF (ODEFSEY) 200-25-25 MG TABS tablet, Take 1 tablet by mouth daily with breakfast., Disp: 90 tablet, Rfl: 3   EPINEPHrine (EPIPEN 2-PAK) 0.3 mg/0.3 mL IJ SOAJ injection, Inject 0.3 mg into the muscle as needed for anaphylaxis., Disp: 1 each, Rfl: 0   risperidone (RISPERDAL) 4 MG tablet, Take 1 tablet (4 mg total) by mouth daily., Disp: 90 tablet, Rfl: 0 Medication Side Effects: none  Family Medical/ Social History: Changes?  See HPI.   MENTAL HEALTH EXAM:  There were no vitals taken for this visit.There is no height or weight on file to calculate BMI.  General Appearance: Casual and Well Groomed  Eye Contact:  Good  Speech:  Clear and Coherent and Normal Rate  Volume:  Normal  Mood:  Euthymic  Affect:  Congruent  Thought Process:  Goal Directed and Descriptions of Associations: Circumstantial  Orientation:  Full (Time, Place, and Person)  Thought Content: Logical   Suicidal Thoughts:  No  Homicidal Thoughts:  No  Memory:  WNL  Judgement:  Good  Insight:  Good  Psychomotor Activity:  Normal  Concentration:  Concentration: Good and Attention Span: Good  Recall:  Good  Fund of Knowledge: Good  Language: Good  Assets:  Desire for Improvement  ADL's:  Intact  Cognition: WNL  Prognosis:  Good   DIAGNOSES:    ICD-10-CM   1. Bipolar I disorder ([place])  F31.9     2. PTSD (post-traumatic stress disorder)   F43.10     3. Generalized anxiety disorder  F41.1     4. Insomnia due to other mental disorder  F51.05    F99      Receiving Psychotherapy: No    RECOMMENDATIONS:  PDMP was reviewed.  Last Xanax filled 03/11/2022. I provided 20 minutes of face to face time during this encounter, including time spent before and after the visit in records review, medical decision making, counseling pertinent to today's visit, and charting.   Doing well on current meds so no changes.   Cont Xanax 1 mg, 1/2-1 p.o. twice daily as needed.   Continue Risperdal 4 mg p.o. nightly. FMLA 3 days per month Intermittent, 8 hours each time. And 2 appts at 4 hours each per month.  Note given that she is required to work from home.  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 Return in 3 months.  Donnal Moat, PA-C

## 2022-07-03 ENCOUNTER — Other Ambulatory Visit: Payer: Self-pay | Admitting: Physician Assistant

## 2022-07-04 NOTE — Telephone Encounter (Signed)
Please schedule appt

## 2022-07-05 ENCOUNTER — Telehealth: Payer: Self-pay | Admitting: Physician Assistant

## 2022-07-05 NOTE — Telephone Encounter (Signed)
Laura Rogers left a voicemail that she has lost her Xanax while traveling from [place] to [place] and is unable to find it anywhere. She is requesting a refill on it? Pharmacy is:   [id number] [address], [place]  Phone:  [phone]  Fax:  [phone]    Left a message to make an appointment.

## 2022-07-05 NOTE — Telephone Encounter (Signed)
Last filled 9/14.

## 2022-07-05 NOTE — Telephone Encounter (Signed)
Pt has an appointment on 07/22/22

## 2022-07-06 ENCOUNTER — Other Ambulatory Visit: Payer: Self-pay

## 2022-07-06 MED ORDER — ALPRAZOLAM 1 MG PO TABS: 1.0000 mg | ORAL_TABLET | Freq: Two times a day (BID) | ORAL | 1 refills | Status: DC | PRN

## 2022-07-06 NOTE — Telephone Encounter (Signed)
Pended.

## 2022-07-18 ENCOUNTER — Other Ambulatory Visit: Payer: Self-pay

## 2022-07-18 ENCOUNTER — Telehealth: Payer: Self-pay | Admitting: Physician Assistant

## 2022-07-18 MED ORDER — RISPERIDONE 4 MG PO TABS: 4.0000 mg | ORAL_TABLET | Freq: Every day | ORAL | 0 refills | Status: DC

## 2022-07-18 MED ORDER — ALPRAZOLAM 1 MG PO TABS: 1.0000 mg | ORAL_TABLET | Freq: Two times a day (BID) | ORAL | 0 refills | Status: DC | PRN

## 2022-07-18 NOTE — Telephone Encounter (Signed)
Pended.
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
## 2022-07-21 ENCOUNTER — Telehealth: Payer: Self-pay

## 2022-07-21 NOTE — Telephone Encounter (Signed)
I was given a message that patient had called and said she tried to commit suicide. I called her back and she said her father is not going to survive, but the burden to take him off of life support has been put on her by her mom, as parents have been separated for years.  She has to make the decision to "kill" him. She has been off her Risperdal for 3 days because the pharmacy did not have in stock. She will get Rx today. She said she took 6 Xanax and drank. She threw it up. She feels like she can't breathe. She can't make her appt tomorrow because she is in [place] and can't get FMLA paperwork done because she can't be seen. Last seen 7/17. She is worried about losing her job. She cried all thru our conversation and apologized. I reassured her that it was fine. I told her that there were doctors/social workers in the hospital that should be able to help with the decision making process for her father and that she should reach out to them. She denied having SI at this time and contracted with me for safety.
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
## 2022-07-22 ENCOUNTER — Ambulatory Visit: Payer: Medicare Other | Admitting: Physician Assistant

## 2022-07-25 ENCOUNTER — Telehealth: Payer: Self-pay | Admitting: Physician Assistant

## 2022-07-25 NOTE — Telephone Encounter (Signed)
Received FMLA paperwork. Given to [facility] 11/13

## 2022-07-28 ENCOUNTER — Encounter: Payer: Self-pay | Admitting: Physician Assistant

## 2022-07-28 ENCOUNTER — Telehealth (INDEPENDENT_AMBULATORY_CARE_PROVIDER_SITE_OTHER): Payer: BC Managed Care – PPO | Admitting: Physician Assistant

## 2022-07-28 DIAGNOSIS — F4310 Post-traumatic stress disorder, unspecified: Secondary | ICD-10-CM

## 2022-07-28 DIAGNOSIS — F319 Bipolar disorder, unspecified: Secondary | ICD-10-CM

## 2022-07-28 DIAGNOSIS — F5105 Insomnia due to other mental disorder: Secondary | ICD-10-CM

## 2022-07-28 DIAGNOSIS — F4321 Adjustment disorder with depressed mood: Secondary | ICD-10-CM | POA: Diagnosis not present

## 2022-07-28 DIAGNOSIS — F411 Generalized anxiety disorder: Secondary | ICD-10-CM

## 2022-07-28 DIAGNOSIS — F99 Mental disorder, not otherwise specified: Secondary | ICD-10-CM

## 2022-07-28 MED ORDER — HYDROXYZINE HCL 10 MG PO TABS: 10.0000 mg | ORAL_TABLET | Freq: Three times a day (TID) | ORAL | 0 refills | Status: DC | PRN

## 2022-07-28 MED ORDER — MIRTAZAPINE 15 MG PO TABS: 7.5000 mg | ORAL_TABLET | Freq: Every day | ORAL | 0 refills | Status: DC
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
## 2022-08-01 DIAGNOSIS — Z0289 Encounter for other administrative examinations: Secondary | ICD-10-CM

## 2022-08-01 NOTE — Telephone Encounter (Signed)
Paper work completed, signed and faxed to [employer]

## 2022-08-03 ENCOUNTER — Telehealth: Payer: Self-pay | Admitting: Physician Assistant

## 2022-08-03 NOTE — Telephone Encounter (Signed)
Received FMLA Forms. Given to [facility] 11/22

## 2022-08-07 ENCOUNTER — Encounter: Payer: Self-pay | Admitting: Physician Assistant

## 2022-08-09 ENCOUNTER — Telehealth: Payer: Self-pay | Admitting: Physician Assistant

## 2022-08-09 NOTE — Telephone Encounter (Signed)
Received fax from [employer] re: Laura Rogers. Completion needed for FMLA form. Placed in [name]'s box to complete.

## 2022-08-11 ENCOUNTER — Encounter: Payer: Self-pay | Admitting: Physician Assistant

## 2022-08-11 ENCOUNTER — Telehealth (INDEPENDENT_AMBULATORY_CARE_PROVIDER_SITE_OTHER): Payer: BC Managed Care – PPO | Admitting: Physician Assistant

## 2022-08-11 DIAGNOSIS — F411 Generalized anxiety disorder: Secondary | ICD-10-CM | POA: Diagnosis not present

## 2022-08-11 DIAGNOSIS — F4321 Adjustment disorder with depressed mood: Secondary | ICD-10-CM

## 2022-08-11 DIAGNOSIS — F319 Bipolar disorder, unspecified: Secondary | ICD-10-CM | POA: Diagnosis not present

## 2022-08-11 DIAGNOSIS — F4323 Adjustment disorder with mixed anxiety and depressed mood: Secondary | ICD-10-CM

## 2022-08-11 DIAGNOSIS — F5105 Insomnia due to other mental disorder: Secondary | ICD-10-CM

## 2022-08-11 MED ORDER — ALPRAZOLAM 1 MG PO TABS: 1.0000 mg | ORAL_TABLET | Freq: Two times a day (BID) | ORAL | 0 refills | Status: DC | PRN

## 2022-08-11 MED ORDER — RISPERIDONE 4 MG PO TABS: 4.0000 mg | ORAL_TABLET | Freq: Every day | ORAL | 11 refills | Status: DC

## 2022-08-11 MED ORDER — HYDROXYZINE HCL 10 MG PO TABS: 10.0000 mg | ORAL_TABLET | Freq: Three times a day (TID) | ORAL | 5 refills | Status: DC | PRN

## 2022-08-11 MED ORDER — MIRTAZAPINE 15 MG PO TABS: 7.5000 mg | ORAL_TABLET | Freq: Every day | ORAL | 5 refills | Status: DC

## 2022-08-11 NOTE — Progress Notes (Signed)
[facility]  Patient ID: Laura Rogers,  MRN: [id number]  PCP: Pcp, No  Date of Evaluation: 08/11/2022  time spent:20 minutes  Chief Complaint:  Chief Complaint   Anxiety; Depression; Insomnia; Follow-up   Virtual Visit via Telehealth  I connected with patient by a video enabled telemedicine application with their informed consent, and verified patient privacy and that I am speaking with the correct person using two identifiers.  I am private, in my office and the patient is at home.  I discussed the limitations, risks, security and privacy concerns of performing an evaluation and management service by telephone video and the availability of in person appointments. I also discussed with the patient that there may be a patient responsible charge related to this service. The patient expressed understanding and agreed to proceed.   I discussed the assessment and treatment plan with the patient. The patient was provided an opportunity to ask questions and all were answered. The patient agreed with the plan and demonstrated an understanding of the instructions.   The patient was advised to call back or seek an in-person evaluation if the symptoms worsen or if the condition fails to improve as anticipated.  I provided 20 minutes of non-face-to-face time during this encounter.  HISTORY/CURRENT STATUS: HPI for routine f/u  Dad is still on a ventilator. Laura Rogers was placed, she has to make a decision about whether to have a G-tube placed or not. Still sad b/c of that. Her siblings defer to her about everything. She cries easily, energy and motivation are low, trouble sleeping even with Mirtazapine, taking 7.5 mg qhs and never increased the dose.  Has nightmares some nights, revolving around her dad.  Appetite is decreased. ADLs and personal hygiene are nl. No SI/HI.  Still a lot of anxiety, Xanax helps.  She feels panicky at times, cannot stop thinking about the situation with her  dad.  The hydroxyzine helps some with the generalized anxiety and also the rash and itching she had started having due to the stress.  Patient denies increased energy with decreased need for sleep, increased talkativeness, racing thoughts, impulsivity or risky behaviors, increased spending, increased libido, grandiosity, increased irritability or anger, paranoia, or hallucinations.  Denies dizziness, syncope, seizures, numbness, tingling, tremor, tics, unsteady gait, slurred speech, confusion. Denies muscle or joint pain, stiffness, or dystonia. Denies unexplained weight loss, frequent infections, or sores that heal slowly.  No polyphagia, polydipsia, or polyuria. Denies visual changes or paresthesias.   Individual Medical History/ Review of Systems: Changes? :No      Past medications for mental health diagnoses include: Risperdal, Zoloft, Restoril, trazodone not effective, Xanax, BuSpar was not effective, Cymbalta ineffective, propranolol ineffective  Allergies: Amoxicillin, Levofloxacin, Valacyclovir hcl, Sulfamethoxazole-trimethoprim, Levofloxacin, Penicillins, and Bactrim  Current Medications:  Current Outpatient Medications:    albuterol (VENTOLIN HFA) 108 (90 Base) MCG/ACT inhaler, Inhale 2 puffs into the lungs every 6 (six) hours as needed for wheezing or shortness of breath., Disp: 8 g, Rfl: 6   emtricitabine-rilpivir-tenofovir AF (ODEFSEY) 200-25-25 MG TABS tablet, Take 1 tablet by mouth daily with breakfast., Disp: 90 tablet, Rfl: 3   ALPRAZolam (XANAX) 1 MG tablet, Take 1 tablet (1 mg total) by mouth 2 (two) times daily as needed for anxiety., Disp: 30 tablet, Rfl: 0   EPINEPHrine (EPIPEN 2-PAK) 0.3 mg/0.3 mL IJ SOAJ injection, Inject 0.3 mg into the muscle as needed for anaphylaxis. (Patient not taking: Reported on 08/11/2022), Disp: 1 each, Rfl: 0   hydrOXYzine (ATARAX) 10  MG tablet, Take 1 tablet (10 mg total) by mouth 3 (three) times daily as needed., Disp: 60 tablet, Rfl: 5    mirtazapine (REMERON) 15 MG tablet, Take 0.5-1 tablets (7.5-15 mg total) by mouth at bedtime., Disp: 30 tablet, Rfl: 5   risperidone (RISPERDAL) 4 MG tablet, Take 1 tablet (4 mg total) by mouth daily., Disp: 30 tablet, Rfl: 11 Medication Side Effects: none  Family Medical/ Social History: Changes?  See HPI.   MENTAL HEALTH EXAM:  There were no vitals taken for this visit.There is no height or weight on file to calculate BMI.  General Appearance: Casual and Well Groomed  Eye Contact:  Good  Speech:  Clear and Coherent and Normal Rate  Volume:  Normal  Mood:   sad  Affect:  Congruent and Tearful  Thought Process:  Goal Directed and Descriptions of Associations: Circumstantial  Orientation:  Full (Time, Place, and Person)  Thought Content: Logical   Suicidal Thoughts:  No  Homicidal Thoughts:  No  Memory:  WNL  Judgement:  Good  Insight:  Good  Psychomotor Activity:  Normal  Concentration:  Concentration: Good and Attention Span: Good  Recall:  Good  Fund of Knowledge: Good  Language: Good  Assets:  Desire for Improvement  ADL's:  Intact  Cognition: WNL  Prognosis:  Good   DIAGNOSES:    ICD-10-CM   1. Situational mixed anxiety and depressive disorder  F43.23     2. Grief  F43.21     3. Bipolar I disorder ([place])  F31.9     4. Generalized anxiety disorder  F41.1     5. Insomnia due to other mental disorder  F51.05    F99       Receiving Psychotherapy: No  Cathlean Cower the case worker for her dad.  She did see a therapist in [place] when they are recently but did not feel that it was a good fit.  Once she is in [place] for good she wants to find someone for individual therapy.  RECOMMENDATIONS:  PDMP was reviewed.  Last Xanax filled 05/26/2022. I provided 20 minutes of non-face-to-face time during this encounter, including time spent before and after the visit in records review, medical decision making, counseling pertinent to today's visit, and charting.    Sleep hygiene discussed.  Right now with her dad's illness and the stress involving that, she is doing the best she can to try to get sleep.  Recommend she increase the mirtazapine to 15 mg nightly.  May need to take prazosin for the nightmares, but prefer to see her and check blood pressure first.  She does not want to start another medication right now anyway.  She will let me know if that changes before next visit.  She will go back to work Monday.  If she is unable to do her job, I have will write her out longer.  Cont Xanax 1 mg, 1/2-1 p.o. twice daily as needed.   Continue Hydroxyzine 10 mg tid prn.  Increase mirtazapine 15 mg, to 1 qhs prn.  Continue Risperdal 4 mg p.o. nightly. First day out 07/19/2022-back 08/15/2022. FMLA 3 days per month Intermittent, 8 hours each time. And 2 appts at 4 hours each per month.  Return in 4 weeks.  Donnal Moat, PA-C

## 2022-08-12 DIAGNOSIS — Z0289 Encounter for other administrative examinations: Secondary | ICD-10-CM

## 2022-08-15 NOTE — Telephone Encounter (Signed)
Paper work completed and faxed on 08/12/2022

## 2022-08-17 NOTE — Telephone Encounter (Signed)
PT LVM @ 9:05a today that there was paperwork sent from the [facility] that she needs filled out (not sure if it's what was recently faxed or not).  She also stated that she has been terminated and she needs someone to call her back to try to get a check.   Next appt 12/21

## 2022-08-20 ENCOUNTER — Encounter: Payer: Self-pay | Admitting: Infectious Diseases

## 2022-08-29 ENCOUNTER — Other Ambulatory Visit (HOSPITAL_COMMUNITY): Payer: Self-pay

## 2022-09-01 ENCOUNTER — Ambulatory Visit: Payer: Medicare Other | Admitting: Physician Assistant

## 2022-10-25 ENCOUNTER — Other Ambulatory Visit: Payer: Self-pay | Admitting: Infectious Diseases

## 2022-10-25 DIAGNOSIS — B20 Human immunodeficiency virus [HIV] disease: Secondary | ICD-10-CM

## 2022-11-29 ENCOUNTER — Other Ambulatory Visit: Payer: Self-pay | Admitting: Infectious Diseases

## 2022-11-29 DIAGNOSIS — B20 Human immunodeficiency virus [HIV] disease: Secondary | ICD-10-CM

## 2022-11-29 DIAGNOSIS — Z113 Encounter for screening for infections with a predominantly sexual mode of transmission: Secondary | ICD-10-CM

## 2022-11-29 DIAGNOSIS — Z79899 Other long term (current) drug therapy: Secondary | ICD-10-CM

## 2022-12-27 ENCOUNTER — Other Ambulatory Visit: Payer: BC Managed Care – PPO

## 2022-12-28 ENCOUNTER — Other Ambulatory Visit: Payer: BC Managed Care – PPO

## 2023-01-09 ENCOUNTER — Other Ambulatory Visit: Payer: Self-pay | Admitting: Physician Assistant

## 2023-01-10 ENCOUNTER — Encounter: Payer: BC Managed Care – PPO | Admitting: Infectious Diseases

## 2023-01-10 NOTE — Telephone Encounter (Deleted)
Please call to schedule an appt, past due.  

## 2023-01-19 NOTE — Telephone Encounter (Signed)
Lvm for pt to call and schedule 

## 2023-05-09 ENCOUNTER — Telehealth (INDEPENDENT_AMBULATORY_CARE_PROVIDER_SITE_OTHER): Payer: 59 | Admitting: Physician Assistant

## 2023-05-09 ENCOUNTER — Encounter: Payer: Self-pay | Admitting: Physician Assistant

## 2023-05-09 DIAGNOSIS — B20 Human immunodeficiency virus [HIV] disease: Secondary | ICD-10-CM

## 2023-05-09 DIAGNOSIS — F4323 Adjustment disorder with mixed anxiety and depressed mood: Secondary | ICD-10-CM

## 2023-05-09 DIAGNOSIS — F319 Bipolar disorder, unspecified: Secondary | ICD-10-CM | POA: Diagnosis not present

## 2023-05-09 DIAGNOSIS — F4321 Adjustment disorder with depressed mood: Secondary | ICD-10-CM

## 2023-05-09 MED ORDER — ALPRAZOLAM 1 MG PO TABS: 1.0000 mg | ORAL_TABLET | Freq: Two times a day (BID) | ORAL | 0 refills | Status: AC | PRN

## 2023-05-09 MED ORDER — HYDROXYZINE HCL 10 MG PO TABS: 10.0000 mg | ORAL_TABLET | Freq: Three times a day (TID) | ORAL | 5 refills | Status: AC | PRN

## 2023-05-09 NOTE — Progress Notes (Signed)
[facility]  Patient ID: Laura Rogers,  MRN: [id number]  PCP: Pcp, No  Date of Evaluation: 05/09/2023  time spent:20 minutes  Chief Complaint:  Chief Complaint   Follow-up    Virtual Visit via Telehealth  I connected with patient by a video enabled telemedicine application with their informed consent, and verified patient privacy and that I am speaking with the correct person using two identifiers.  I am private, in my office and the patient is at work.  I discussed the limitations, risks, security and privacy concerns of performing an evaluation and management service by video and the availability of in person appointments. I also discussed with the patient that there may be a patient responsible charge related to this service. The patient expressed understanding and agreed to proceed.   I discussed the assessment and treatment plan with the patient. The patient was provided an opportunity to ask questions and all were answered. The patient agreed with the plan and demonstrated an understanding of the instructions.   The patient was advised to call back or seek an in-person evaluation if the symptoms worsen or if the condition fails to improve as anticipated.  I provided 20 minutes of non-face-to-face time during this encounter.  HISTORY/CURRENT STATUS: HPI for routine f/u  Dad died last winter. A lot of stress there, her parents weren't together but her Mom is admin of everything and gave her everything. Her brothers are mad at her. A cousin died from MI. Pt is now working in [place], drives 3 hours round trip. New job, works at [employer] now. Has been out of work some due to all this. She's been out of xanax for awhile and really needs it. Doesn't feel like she needs to be around people, requests working from home as much as she can, b/c they will mandate masks again soon, and she can't breathe with them on.  More depressed.  Wants to sleep a lot. Not enjoying  things, has low energy, she tries to go to the park or things like that to help her feel better.  Cries a lot.  ADLs and personal hygiene are normal.   Denies any changes in concentration, making decisions, or remembering things.  Appetite has not changed.  Weight is stable.  Denies suicidal or homicidal thoughts.  Patient denies increased energy with decreased need for sleep, increased talkativeness, racing thoughts, impulsivity or risky behaviors, increased spending, increased libido, grandiosity, increased irritability or anger, paranoia, or hallucinations.  Denies dizziness, syncope, seizures, numbness, tingling, tremor, tics, unsteady gait, slurred speech, confusion. Denies muscle or joint pain, stiffness, or dystonia. Denies unexplained weight loss, frequent infections, or sores that heal slowly.  No polyphagia, polydipsia, or polyuria. Denies visual changes or paresthesias.   Individual Medical History/ Review of Systems: Changes? :No      Past medications for mental health diagnoses include: Risperdal, Zoloft, Restoril, trazodone not effective, Xanax, BuSpar was not effective, Cymbalta ineffective, propranolol ineffective  Allergies: Amoxicillin, Levofloxacin, Valacyclovir hcl, Sulfamethoxazole-trimethoprim, Levofloxacin, Penicillins, and Bactrim  Current Medications:  Current Outpatient Medications:    albuterol (VENTOLIN HFA) 108 (90 Base) MCG/ACT inhaler, Inhale 2 puffs into the lungs every 6 (six) hours as needed for wheezing or shortness of breath., Disp: 8 g, Rfl: 6   DOVATO 50-300 MG tablet, TAKE 1 TABLET BY MOUTH DAILY, Disp: 30 tablet, Rfl: 6   mirtazapine (REMERON) 15 MG tablet, Take 0.5-1 tablets (7.5-15 mg total) by mouth at bedtime., Disp: 30 tablet, Rfl: 5  risperidone (RISPERDAL) 4 MG tablet, Take 1 tablet (4 mg total) by mouth daily., Disp: 30 tablet, Rfl: 11   ALPRAZolam (XANAX) 1 MG tablet, Take 1 tablet (1 mg total) by mouth 2 (two) times daily as needed for anxiety.,  Disp: 30 tablet, Rfl: 0   EPINEPHrine (EPIPEN 2-PAK) 0.3 mg/0.3 mL IJ SOAJ injection, Inject 0.3 mg into the muscle as needed for anaphylaxis. (Patient not taking: Reported on 08/11/2022), Disp: 1 each, Rfl: 0   hydrOXYzine (ATARAX) 10 MG tablet, Take 1 tablet (10 mg total) by mouth 3 (three) times daily as needed., Disp: 60 tablet, Rfl: 5 Medication Side Effects: none  Family Medical/ Social History: Changes?  See HPI.   MENTAL HEALTH EXAM:  There were no vitals taken for this visit.There is no height or weight on file to calculate BMI.  General Appearance: Casual and Well Groomed  Eye Contact:  Good  Speech:  Clear and Coherent and Normal Rate  Volume:  Normal  Mood:   sad  Affect:  Depressed  Thought Process:  Goal Directed and Descriptions of Associations: Circumstantial  Orientation:  Full (Time, Place, and Person)  Thought Content: Logical   Suicidal Thoughts:  No  Homicidal Thoughts:  No  Memory:  WNL  Judgement:  Good  Insight:  Good  Psychomotor Activity:  Normal  Concentration:  Concentration: Good and Attention Span: Good  Recall:  Good  Fund of Knowledge: Good  Language: Good  Assets:  Desire for Improvement Financial Resources/Insurance Housing Transportation Vocational/Educational  ADL's:  Intact  Cognition: WNL  Prognosis:  Good   DIAGNOSES:    ICD-10-CM   1. Situational mixed anxiety and depressive disorder  F43.23     2. Grief  F43.21     3. Bipolar I disorder (HCC)  F31.9     4. HIV disease (HCC)  B20      Receiving Psychotherapy: No    RECOMMENDATIONS:  PDMP was reviewed.  Last Xanax filled 09/23/2022. I provided 20 minutes of face to face time during this encounter, including time spent before and after the visit in records review, medical decision making, counseling pertinent to today's visit, and charting.   My condolences in the loss of her dad.  Cont Xanax 1 mg, 1/2-1 p.o. twice daily as needed.   Continue Hydroxyzine 10 mg tid prn.   Continue mirtazapine 15 mg, 1 qhs prn.  Continue Risperdal 4 mg p.o. nightly. She will send FMLA papers by fax or drop them off.  We will approve FMLA 3 days per month Intermittent, 8 hours each time. And 2 appts at 4 hours each per month. Recommend working from home d/t anxiety.  Return in 6 weeks.  Melony Overly, PA-C

## 2023-06-07 ENCOUNTER — Telehealth: Payer: Self-pay | Admitting: Physician Assistant

## 2023-06-07 NOTE — Telephone Encounter (Signed)
Just received paper work today so no they have not been sent anything.

## 2023-06-07 NOTE — Telephone Encounter (Signed)
Courtney at [employer] [facility] LVM @ 8:54a.  She stated that they have sent [place] an Accomodation form for the pt and they have not received it back.  No upcoming appt scheduled.  Phone number [phone]

## 2023-06-14 ENCOUNTER — Telehealth: Payer: Self-pay | Admitting: Physician Assistant

## 2023-06-14 NOTE — Telephone Encounter (Signed)
Laura Rogers at [employer] LVM @ 4:59p.  She is looking for the status of the Accomodation form they sent [place] for the pt.  No upcoming appt scheduled.

## 2023-06-14 NOTE — Telephone Encounter (Signed)
Previous message said paperwork received on 9/25. I don't see any documentation after that date.

## 2023-06-19 NOTE — Telephone Encounter (Signed)
Will have to check paperwork when returning to office tomorrow

## 2023-06-20 DIAGNOSIS — Z0289 Encounter for other administrative examinations: Secondary | ICD-10-CM

## 2023-06-20 NOTE — Telephone Encounter (Signed)
Paperwork completed and given to Rosey Bath to review & sign

## 2023-06-23 NOTE — Telephone Encounter (Signed)
Paperwork was already faxed

## 2023-08-25 ENCOUNTER — Other Ambulatory Visit: Payer: Self-pay | Admitting: Infectious Diseases

## 2023-09-11 ENCOUNTER — Other Ambulatory Visit: Payer: Self-pay

## 2023-09-11 DIAGNOSIS — B20 Human immunodeficiency virus [HIV] disease: Secondary | ICD-10-CM

## 2023-09-11 MED ORDER — DOVATO 50-300 MG PO TABS: 1.0000 | ORAL_TABLET | Freq: Every day | ORAL | 6 refills | Status: DC

## 2023-09-11 NOTE — Telephone Encounter (Signed)
Patient called she stated she is out of town and is requesting a rx refill to be sent to a pharmacy near her which is [id number] - [place], [place] - [address]

## 2023-10-31 ENCOUNTER — Telehealth (INDEPENDENT_AMBULATORY_CARE_PROVIDER_SITE_OTHER): Payer: 59 | Admitting: Physician Assistant

## 2023-10-31 ENCOUNTER — Encounter: Payer: Self-pay | Admitting: Physician Assistant

## 2023-10-31 DIAGNOSIS — B20 Human immunodeficiency virus [HIV] disease: Secondary | ICD-10-CM | POA: Diagnosis not present

## 2023-10-31 DIAGNOSIS — F4321 Adjustment disorder with depressed mood: Secondary | ICD-10-CM

## 2023-10-31 DIAGNOSIS — F3130 Bipolar disorder, current episode depressed, mild or moderate severity, unspecified: Secondary | ICD-10-CM

## 2023-10-31 DIAGNOSIS — F4323 Adjustment disorder with mixed anxiety and depressed mood: Secondary | ICD-10-CM

## 2023-10-31 DIAGNOSIS — F99 Mental disorder, not otherwise specified: Secondary | ICD-10-CM

## 2023-10-31 DIAGNOSIS — F5105 Insomnia due to other mental disorder: Secondary | ICD-10-CM

## 2023-10-31 MED ORDER — RISPERIDONE 1 MG PO TABS: ORAL_TABLET | ORAL | 1 refills | Status: DC

## 2023-10-31 MED ORDER — MIRTAZAPINE 15 MG PO TABS: 7.5000 mg | ORAL_TABLET | Freq: Every day | ORAL | 1 refills | Status: DC

## 2023-10-31 NOTE — Progress Notes (Signed)
 [facility]  Patient ID: Laura Rogers,  MRN: [id number]  PCP: Pcp, No  Date of Evaluation: 10/31/2023 time spent:25 minutes  Chief Complaint:  Chief Complaint   Anxiety; Depression; Insomnia    Virtual Visit via Telehealth  I connected with patient by a video enabled telemedicine application with their informed consent, and verified patient privacy and that I am speaking with the correct person using two identifiers.  I am private, in my office and the patient is at home.  I discussed the limitations, risks, security and privacy concerns of performing an evaluation and management service by video and the availability of in person appointments. I also discussed with the patient that there may be a patient responsible charge related to this service. The patient expressed understanding and agreed to proceed.   I discussed the assessment and treatment plan with the patient. The patient was provided an opportunity to ask questions and all were answered. The patient agreed with the plan and demonstrated an understanding of the instructions.   The patient was advised to call back or seek an in-person evaluation if the symptoms worsen or if the condition fails to improve as anticipated.  I provided 25 minutes of non-face-to-face time during this encounter.  HISTORY/CURRENT STATUS: HPI for routine f/u. Not doing well.  States she hasn't been on mental health meds since the last visit.  States she was feeling better and thought she did not need it.  She also stopped the medication for HIV.  She did restart that however.  She is very anxious a lot of the time, due to current circumstances.  Not having panic attacks.  She has not taken Xanax probably in 6 months, states it makes her too sleepy and she would just rather try to cope with the anxiety.    Under a lot of stress with her inheritance from her father, who died in 2022/09/16. Her mom is executer of the will, her brother  impersonated their father with some of the legal things, a lot is going on with fighting over the money, he apparently had $100,000 in life insurance, she goes into more detail about her disappointments with her family.  Another stressor is this job.  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  She got chewed out by a coworker today which has caused even more anxiety.  She works from home this week (has 12 days a month that she can work from home) but she is too upset and anxious to work today after what happened this morning.  She moved to [place], does not know anybody there.  That has been hard also.  She feels very alone.  Does not want to do much of anything.  She is not sleeping well.  ADLs and personal hygiene are normal.  Appetite is normal and weight is stable.  No suicidal or homicidal thoughts.  Patient denies increased energy with decreased need for sleep, increased talkativeness, racing thoughts, impulsivity or risky behaviors, increased spending, increased libido, grandiosity, increased irritability or anger, paranoia, or hallucinations.  Denies dizziness, syncope, seizures, numbness, tingling, tremor, tics, unsteady gait, slurred speech, confusion. Denies muscle or joint pain, stiffness, or dystonia. Denies unexplained weight loss, frequent infections, or sores that heal slowly.  No polyphagia, polydipsia, or polyuria. Denies visual changes or paresthesias.   Individual Medical History/ Review of Systems: Changes? :No      Past medications for mental health diagnoses include: Risperdal,  Zoloft, Restoril, trazodone not effective, Xanax, BuSpar was not effective, Cymbalta ineffective, propranolol ineffective  Allergies: Amoxicillin, Levofloxacin, Valacyclovir hcl, Sulfamethoxazole-trimethoprim, Levofloxacin, Penicillins, and Bactrim  Current Medications:  Current Outpatient Medications:    albuterol (VENTOLIN HFA) 108 (90 Base) MCG/ACT inhaler, Inhale 2  puffs into the lungs every 6 (six) hours as needed for wheezing or shortness of breath., Disp: 8 g, Rfl: 6   dolutegravir-lamiVUDine (DOVATO) 50-300 MG tablet, Take 1 tablet by mouth daily., Disp: 30 tablet, Rfl: 6   risperiDONE (RISPERDAL) 1 MG tablet, 1/2 po at bedtime for 4 nights, then 1 po at bedtime for 4 nights, then 2 po at bedtime., Disp: 60 tablet, Rfl: 1   ALPRAZolam (XANAX) 1 MG tablet, Take 1 tablet (1 mg total) by mouth 2 (two) times daily as needed for anxiety. (Patient not taking: Reported on 10/31/2023), Disp: 30 tablet, Rfl: 0   EPINEPHrine (EPIPEN 2-PAK) 0.3 mg/0.3 mL IJ SOAJ injection, Inject 0.3 mg into the muscle as needed for anaphylaxis. (Patient not taking: Reported on 10/31/2023), Disp: 1 each, Rfl: 0   hydrOXYzine (ATARAX) 10 MG tablet, Take 1 tablet (10 mg total) by mouth 3 (three) times daily as needed. (Patient not taking: Reported on 10/31/2023), Disp: 60 tablet, Rfl: 5   mirtazapine (REMERON) 15 MG tablet, Take 0.5-1 tablets (7.5-15 mg total) by mouth at bedtime., Disp: 30 tablet, Rfl: 1 Medication Side Effects: none  Family Medical/ Social History: Changes?  See HPI.   MENTAL HEALTH EXAM:  There were no vitals taken for this visit.There is no height or weight on file to calculate BMI.  General Appearance: Casual and Well Groomed  Eye Contact:  Good  Speech:  Clear and Coherent and Normal Rate  Volume:  Normal  Mood:  Anxious and Depressed  Affect:  Depressed and Tearful  Thought Process:  Goal Directed and Descriptions of Associations: Circumstantial  Orientation:  Full (Time, Place, and Person)  Thought Content: Logical   Suicidal Thoughts:  No  Homicidal Thoughts:  No  Memory:  WNL  Judgement:  Good  Insight:  Good  Psychomotor Activity:  Normal  Concentration:  Concentration: Good and Attention Span: Good  Recall:  Good  Fund of Knowledge: Good  Language: Good  Assets:  Desire for Improvement Financial  Resources/Insurance Housing Transportation Vocational/Educational  ADL's:  Intact  Cognition: WNL  Prognosis:  Good   DIAGNOSES:    ICD-10-CM   1. Bipolar I disorder, most recent episode depressed (HCC)  F31.30     2. Situational mixed anxiety and depressive disorder  F43.23     3. Grief  F43.21     4. HIV disease (HCC)  B20     5. Insomnia due to other mental disorder  F51.05    F99       Receiving Psychotherapy: No    RECOMMENDATIONS:  PDMP was reviewed.  Last Xanax filled 05/09/2023. I provided 25 minutes of nonface to face time during this encounter, including time spent before and after the visit in records review, medical decision making, counseling pertinent to today's visit, and charting.   We discussed compliance with medication.  I recommend restarting the Risperdal and mirtazapine.  I strongly recommend she see a Veterinary surgeon.  I am unable to help her with that.  I provided approximately 18 minutes in counseling concerning sleep hygiene.  Continue FMLA 3 days per month Intermittent, 8 hours each time. And 2 appts at 4 hours each per month. Works from home 12 days a month  d/t anxiety.  Out of work today.  A note will be sent stating she was seen today and unable to work due to illness.  Restart Xanax 1 mg, 1/2-1 twice daily as needed anxiety. Restart mirtazapine 15 mg, 1/2-1 p.o. nightly as needed sleep. Restart Risperdal 1 mg, one half p.o. nightly for 4 nights, then 1 p.o. nightly for 4 nights, then 2 p.o. nightly. Strongly recommend counseling. Return in 6 to 8 weeks.  Melony Overly, PA-C

## 2023-11-14 ENCOUNTER — Telehealth: Payer: Self-pay | Admitting: Physician Assistant

## 2023-11-14 NOTE — Telephone Encounter (Signed)
 Patient called asking you to take her our of work for a month. She said that you had discussed it with her on 2/27 and that is the start date. She reports not taking her meds like she should, she is manic, crying all the time, and problems at work. She said her employer would send paperwork.

## 2023-11-14 NOTE — Telephone Encounter (Signed)
 That is fine since she's getting back on her meds and will need time to adjust to them. Starting 2/27 and go back on 12/11/2023.  No extension, the longer she stays out, the hard it will be for her to get back into work. Please reiterate the importance of staying on her meds so she doesn't have these type episodes in the future.  If she's not seeing a counselor, that is a must. Go to ER if suicidal.

## 2023-11-14 NOTE — Telephone Encounter (Addendum)
 Pt left message 7:38 am requesting TH to put her on leave for a month. Next apt 12/20/23   Called Pt. LVM to RTC will need more details and ppwk to complete.

## 2023-11-15 NOTE — Telephone Encounter (Signed)
 LVM to [facility]

## 2023-11-16 ENCOUNTER — Telehealth: Payer: Self-pay | Admitting: Physician Assistant

## 2023-11-16 DIAGNOSIS — Z0289 Encounter for other administrative examinations: Secondary | ICD-10-CM

## 2023-11-16 NOTE — Telephone Encounter (Signed)
 Patient notified of recommendations. She said Tammy had called her and she has pain for the paperwork.

## 2023-11-16 NOTE — Telephone Encounter (Signed)
 Received STD forms by fax to complete from [facility]. Put in [place] box

## 2023-11-16 NOTE — Telephone Encounter (Signed)
 Pt called 11:45 to advise new pharmacy [facility] [address] [place] [phone]. Stated she needs all meds transferred. She has not been able to get from [facility]

## 2023-11-17 NOTE — Telephone Encounter (Signed)
 Noted dates out for STD. Paperwork from [place] is completed and will give to Rosey Bath to review and sign.

## 2023-11-20 DIAGNOSIS — Z0289 Encounter for other administrative examinations: Secondary | ICD-10-CM

## 2023-11-20 MED ORDER — MIRTAZAPINE 15 MG PO TABS
7.5000 mg | ORAL_TABLET | Freq: Every day | ORAL | 1 refills | Status: AC
Start: 2023-11-20 — End: ?

## 2023-11-20 MED ORDER — RISPERIDONE 1 MG PO TABS: ORAL_TABLET | ORAL | 1 refills | Status: DC

## 2023-11-20 NOTE — Telephone Encounter (Signed)
Addressed via pharmacy RF request

## 2023-11-21 NOTE — Telephone Encounter (Signed)
Forms faxed per request.

## 2023-12-04 ENCOUNTER — Telehealth: Payer: Self-pay | Admitting: Physician Assistant

## 2023-12-04 NOTE — Telephone Encounter (Signed)
 Pt called 3/21 4:58 requesting ppwk to also include intermittent time to read 3 times monthly and doctors apts added to form before she goes back to work. If not already on form with dates 11/08/23 to 12/11/23 out of work. Contact pt @ [phone]

## 2023-12-20 ENCOUNTER — Encounter: Payer: Self-pay | Admitting: Physician Assistant

## 2023-12-20 ENCOUNTER — Telehealth: Payer: 59 | Admitting: Physician Assistant

## 2023-12-20 DIAGNOSIS — F3130 Bipolar disorder, current episode depressed, mild or moderate severity, unspecified: Secondary | ICD-10-CM | POA: Diagnosis not present

## 2023-12-20 DIAGNOSIS — F4323 Adjustment disorder with mixed anxiety and depressed mood: Secondary | ICD-10-CM | POA: Diagnosis not present

## 2023-12-20 DIAGNOSIS — R400 Somnolence: Secondary | ICD-10-CM | POA: Diagnosis not present

## 2023-12-20 MED ORDER — ARIPIPRAZOLE 5 MG PO TABS: 5.0000 mg | ORAL_TABLET | Freq: Every morning | ORAL | 1 refills | Status: DC

## 2023-12-20 NOTE — Progress Notes (Signed)
 [facility]  Patient ID: Laura Rogers,  MRN: [id number]  PCP: Pcp, No  Date of Evaluation: 12/20/2023 time spent:20 minutes  Chief Complaint:  Chief Complaint   Anxiety; Depression    Virtual Visit via Telehealth  I connected with patient by a video enabled telemedicine application with their informed consent, and verified patient privacy and that I am speaking with the correct person using two identifiers.  I am private, in my office and the patient is at home.  I discussed the limitations, risks, security and privacy concerns of performing an evaluation and management service by video and the availability of in person appointments. I also discussed with the patient that there may be a patient responsible charge related to this service. The patient expressed understanding and agreed to proceed.   I discussed the assessment and treatment plan with the patient. The patient was provided an opportunity to ask questions and all were answered. The patient agreed with the plan and demonstrated an understanding of the instructions.   The patient was advised to call back or seek an in-person evaluation if the symptoms worsen or if the condition fails to improve as anticipated.  I provided 20 minutes of non-face-to-face time during this encounter.  HISTORY/CURRENT STATUS: HPI for routine f/u.    Went back to work on 12/11/2023. Was out of work on 4/7-4/8 b/c  anxiety. She's taken Xanax a few times since our last visit and it hit her 'hard.'  Made her really sleepy.   "I feel off." Sad, cries easily. Still grieving her father who died last year.  Discord in the family b/c of the inheritance.  Energy and motivation are low, the Risperdal is making her really sleepy and doesn't seem to be changing anything.  ADLs and personal hygiene are normal.   Denies any changes in concentration, making decisions, or remembering things.  Appetite has not changed.  Weight is stable.  Denies suicidal  or homicidal thoughts.  Patient denies increased energy with decreased need for sleep, increased talkativeness, racing thoughts, impulsivity or risky behaviors, increased spending, increased libido, grandiosity, increased irritability or anger, paranoia, or hallucinations.  Denies dizziness, syncope, seizures, numbness, tingling, tremor, tics, unsteady gait, slurred speech, confusion. Denies muscle or joint pain, stiffness, or dystonia. Denies unexplained weight loss, frequent infections, or sores that heal slowly.  No polyphagia, polydipsia, or polyuria. Denies visual changes or paresthesias.   Individual Medical History/ Review of Systems: Changes? :No      Past medications for mental health diagnoses include: Risperdal, Zoloft, Restoril, trazodone not effective, Xanax, BuSpar was not effective, Cymbalta ineffective, propranolol ineffective  Allergies: Amoxicillin, Levofloxacin, Valacyclovir hcl, Sulfamethoxazole-trimethoprim, Levofloxacin, Penicillins, and Bactrim  Current Medications:  Current Outpatient Medications:    albuterol (VENTOLIN HFA) 108 (90 Base) MCG/ACT inhaler, Inhale 2 puffs into the lungs every 6 (six) hours as needed for wheezing or shortness of breath., Disp: 8 g, Rfl: 6   ALPRAZolam (XANAX) 1 MG tablet, Take 1 tablet (1 mg total) by mouth 2 (two) times daily as needed for anxiety., Disp: 30 tablet, Rfl: 0   ARIPiprazole (ABILIFY) 5 MG tablet, Take 1 tablet (5 mg total) by mouth every morning., Disp: 30 tablet, Rfl: 1   dolutegravir-lamiVUDine (DOVATO) 50-300 MG tablet, Take 1 tablet by mouth daily., Disp: 30 tablet, Rfl: 6   mirtazapine (REMERON) 15 MG tablet, Take 0.5-1 tablets (7.5-15 mg total) by mouth at bedtime., Disp: 30 tablet, Rfl: 1   EPINEPHrine (EPIPEN 2-PAK) 0.3 mg/0.3 mL IJ  SOAJ injection, Inject 0.3 mg into the muscle as needed for anaphylaxis. (Patient not taking: Reported on 08/11/2022), Disp: 1 each, Rfl: 0   hydrOXYzine (ATARAX) 10 MG tablet, Take 1  tablet (10 mg total) by mouth 3 (three) times daily as needed. (Patient not taking: Reported on 12/20/2023), Disp: 60 tablet, Rfl: 5 Medication Side Effects: none  Family Medical/ Social History: Changes?  See HPI.   MENTAL HEALTH EXAM:  There were no vitals taken for this visit.There is no height or weight on file to calculate BMI.  General Appearance: Casual and Well Groomed  Eye Contact:  Good  Speech:  Clear and Coherent and Normal Rate  Volume:  Normal  Mood:  Depressed  Affect:  Depressed  Thought Process:  Goal Directed and Descriptions of Associations: Circumstantial  Orientation:  Full (Time, Place, and Person)  Thought Content: Logical   Suicidal Thoughts:  No  Homicidal Thoughts:  No  Memory:  WNL  Judgement:  Good  Insight:  Good  Psychomotor Activity:  Normal  Concentration:  Concentration: Good and Attention Span: Good  Recall:  Good  Fund of Knowledge: Good  Language: Good  Assets:  Desire for Improvement Financial Resources/Insurance Housing Transportation Vocational/Educational  ADL's:  Intact  Cognition: WNL  Prognosis:  Good   DIAGNOSES:    ICD-10-CM   1. Bipolar I disorder, most recent episode depressed (HCC)  F31.30     2. Situational mixed anxiety and depressive disorder  F43.23     3. Somnolence  R40.0       Receiving Psychotherapy: No    RECOMMENDATIONS:  PDMP was reviewed.  Last Xanax filled 05/09/2023. I provided 20 minutes of non-face to face time during this encounter, including time spent before and after the visit in records review, medical decision making, counseling pertinent to today's visit, and charting.   We discussed tx options. I recommend changing Risperdal to Abilify. Explained rationale. Benefits, risks, and SE discussed and she accepts.   Continue FMLA 3 days per month Intermittent, 8 hours each time. And 2 appts at 4 hours each per month. Works from home 12 days a month d/t anxiety.    Continue Xanax 1 mg, 1/2-1 twice  daily as needed anxiety.  Encouraged her to take only 1/2 when needed.   Start Abilify 5 mg, 1 q am. Continue mirtazapine 15 mg, 1/2-1 p.o. nightly as needed sleep. Cont counseling. Return in 6 to 8 weeks.  Melony Overly, PA-C

## 2024-01-04 ENCOUNTER — Telehealth: Payer: Self-pay | Admitting: Physician Assistant

## 2024-01-04 NOTE — Telephone Encounter (Signed)
 Faxed intermittent paperwork to [employer]. @ [phone] . Pt notified

## 2024-02-16 ENCOUNTER — Encounter: Payer: Self-pay | Admitting: Physician Assistant

## 2024-02-16 ENCOUNTER — Telehealth: Admitting: Physician Assistant

## 2024-02-16 DIAGNOSIS — F4323 Adjustment disorder with mixed anxiety and depressed mood: Secondary | ICD-10-CM

## 2024-02-16 DIAGNOSIS — F3130 Bipolar disorder, current episode depressed, mild or moderate severity, unspecified: Secondary | ICD-10-CM | POA: Diagnosis not present

## 2024-02-16 MED ORDER — ARIPIPRAZOLE 10 MG PO TABS: 10.0000 mg | ORAL_TABLET | Freq: Every day | ORAL | 1 refills | Status: AC

## 2024-02-16 NOTE — Progress Notes (Signed)
 [facility]  Patient ID: Laura Rogers,  MRN: [id number]  PCP: Pcp, No  Date of Evaluation: 02/16/2024 Time spent:25 minutes  Chief Complaint:  Chief Complaint   Anxiety; Depression; Follow-up    Virtual Visit via Telehealth  I connected with patient by a video enabled telemedicine application with their informed consent, and verified patient privacy and that I am speaking with the correct person using two identifiers.  I am private, in my office and the patient is at home.  I discussed the limitations, risks, security and privacy concerns of performing an evaluation and management service by video and the availability of in person appointments. I also discussed with the patient that there may be a patient responsible charge related to this service. The patient expressed understanding and agreed to proceed.   I discussed the assessment and treatment plan with the patient. The patient was provided an opportunity to ask questions and all were answered. The patient agreed with the plan and demonstrated an understanding of the instructions.   The patient was advised to call back or seek an in-person evaluation if the symptoms worsen or if the condition fails to improve as anticipated.  I provided 25 minutes of non-face-to-face time during this encounter.  HISTORY/CURRENT STATUS: HPI for routine f/u.    Has been lonely, trying to get used to living in [place] now. Is dating a new guy who lives in [place], it's not going great, she does so much for him and their relationship but he doesn't reciprocate, so it's hard.  Energy and motivation are fair.  Work is going well. Still gets depressed and misses work a few days a month.  Hard time enjoying things, partly b/c she's alone.  Doesn't like to do things by herself.  No feelings of hopelessness but does cry sometimes.  Sleeps well most of the time.  ADLs and personal hygiene are normal.   Denies any changes in concentration,  making decisions, or remembering things.  Appetite has not changed.  Weight is stable.  Anxiety is controlled. Takes Xanax  occas.  Denies suicidal or homicidal thoughts.  Patient denies increased energy with decreased need for sleep, increased talkativeness, racing thoughts, impulsivity or risky behaviors, increased spending, increased libido, grandiosity, increased irritability or anger, paranoia, or hallucinations.  Denies dizziness, syncope, seizures, numbness, tingling, tremor, tics, unsteady gait, slurred speech, confusion. Denies muscle or joint pain, stiffness, or dystonia. Denies unexplained weight loss, frequent infections, or sores that heal slowly.  No polyphagia, polydipsia, or polyuria. Denies visual changes or paresthesias.   Individual Medical History/ Review of Systems: Changes? :No      Past medications for mental health diagnoses include: Risperdal , Zoloft , Restoril , trazodone  not effective, Xanax , BuSpar  was not effective, Cymbalta  ineffective, propranolol  ineffective  Allergies: Amoxicillin, Levofloxacin, Valacyclovir  hcl, Sulfamethoxazole -trimethoprim , Levofloxacin, Penicillins, and Bactrim   Current Medications:  Current Outpatient Medications:    albuterol  (VENTOLIN  HFA) 108 (90 Base) MCG/ACT inhaler, Inhale 2 puffs into the lungs every 6 (six) hours as needed for wheezing or shortness of breath., Disp: 8 g, Rfl: 6   ARIPiprazole  (ABILIFY ) 10 MG tablet, Take 1 tablet (10 mg total) by mouth daily., Disp: 30 tablet, Rfl: 1   dolutegravir -lamiVUDine  (DOVATO ) 50-300 MG tablet, Take 1 tablet by mouth daily., Disp: 30 tablet, Rfl: 6   mirtazapine  (REMERON ) 15 MG tablet, Take 0.5-1 tablets (7.5-15 mg total) by mouth at bedtime., Disp: 30 tablet, Rfl: 1   ALPRAZolam  (XANAX ) 1 MG tablet, Take 1 tablet (1 mg total) by  mouth 2 (two) times daily as needed for anxiety. (Patient not taking: Reported on 02/16/2024), Disp: 30 tablet, Rfl: 0   EPINEPHrine  (EPIPEN  2-PAK) 0.3 mg/0.3 mL IJ SOAJ  injection, Inject 0.3 mg into the muscle as needed for anaphylaxis. (Patient not taking: Reported on 02/16/2024), Disp: 1 each, Rfl: 0   hydrOXYzine  (ATARAX ) 10 MG tablet, Take 1 tablet (10 mg total) by mouth 3 (three) times daily as needed. (Patient not taking: Reported on 10/31/2023), Disp: 60 tablet, Rfl: 5 Medication Side Effects: none  Family Medical/ Social History: Changes?  See HPI.  MENTAL HEALTH EXAM:  There were no vitals taken for this visit.There is no height or weight on file to calculate BMI.  General Appearance: Casual and Well Groomed  Eye Contact:  Good  Speech:  Clear and Coherent and Normal Rate  Volume:  Normal  Mood:  sad  Affect:  Tearful  Thought Process:  Goal Directed and Descriptions of Associations: Circumstantial  Orientation:  Full (Time, Place, and Person)  Thought Content: Logical   Suicidal Thoughts:  No  Homicidal Thoughts:  No  Memory:  WNL  Judgement:  Good  Insight:  Good  Psychomotor Activity:  Normal  Concentration:  Concentration: Good and Attention Span: Good  Recall:  Good  Fund of Knowledge: Good  Language: Good  Assets:  Desire for Improvement Financial Resources/Insurance Housing Transportation Vocational/Educational  ADL's:  Intact  Cognition: WNL  Prognosis:  Good   DIAGNOSES:    ICD-10-CM   1. Bipolar I disorder, most recent episode depressed (HCC)  F31.30     2. Situational mixed anxiety and depressive disorder  F43.23       Receiving Psychotherapy: No    RECOMMENDATIONS:  PDMP was reviewed.  Last Xanax  filled 05/09/2023. I provided 25 minutes of non-face-to-face time during this encounter, including time spent before and after the visit in records review, medical decision making, counseling pertinent to today's visit, and charting.   Discussed treatment options.  Recommend increasing Abilify . Reminded her of healthy life choices, diet, exercise, take time for herself daily.   Continue FMLA 3 days per month  Intermittent, 8 hours each time. And 2 appts at 4 hours each per month. Works from home 12 days a month d/t anxiety.    Continue Xanax  1 mg, 1/2-1 twice daily as needed anxiety.  Encouraged her to take only 1/2 when needed.   Increase Abilify  to 10 mg, 1 q am. Return in 6-8 weeks.   Marvia Slocumb, PA-C

## 2024-03-07 ENCOUNTER — Other Ambulatory Visit: Payer: Self-pay | Admitting: Physician Assistant

## 2024-07-08 ENCOUNTER — Telehealth: Payer: Self-pay | Admitting: Infectious Diseases

## 2024-07-08 NOTE — Telephone Encounter (Signed)
 Patient has not been seen by Dr. Eben since 01/2022.  Forwarding message to be sure that it is okay to schedule patient an appointment.   Copied from CRM [id number]. Topic: Appointments - Scheduling Inquiry for Clinic >> Jul 03, 2024 11:21 AM Debby BROCKS wrote: Reason for CRM: Patient states Dr. Eben is her PCP as she has been seen before by him at [facility]. However no PCP on file shows. She would like to schedule an appointment with him for a general follow up as she has not been seen in over a year and she would also like to get blood work ordered

## 2024-07-10 ENCOUNTER — Telehealth: Admitting: Physician Assistant

## 2024-07-10 DIAGNOSIS — A6004 Herpesviral vulvovaginitis: Secondary | ICD-10-CM | POA: Diagnosis not present

## 2024-07-10 DIAGNOSIS — A630 Anogenital (venereal) warts: Secondary | ICD-10-CM | POA: Diagnosis not present

## 2024-07-10 DIAGNOSIS — L739 Follicular disorder, unspecified: Secondary | ICD-10-CM

## 2024-07-10 MED ORDER — PODOFILOX 0.5 % EX SOLN
CUTANEOUS | 0 refills | Status: AC
Start: 2024-07-10 — End: ?

## 2024-07-10 MED ORDER — VALACYCLOVIR HCL 500 MG PO TABS: 500.0000 mg | ORAL_TABLET | Freq: Two times a day (BID) | ORAL | 0 refills | Status: AC

## 2024-07-10 MED ORDER — MUPIROCIN 2 % EX OINT
1.0000 | TOPICAL_OINTMENT | Freq: Two times a day (BID) | CUTANEOUS | 0 refills | Status: AC
Start: 2024-07-10 — End: ?

## 2024-07-10 NOTE — Patient Instructions (Signed)
 Laura Rogers, thank you for joining [facility] CHRISTELLA [facility], PA-C for today's virtual visit.  While this provider is not your primary care provider (PCP), if your PCP is located in our provider database this encounter information will be shared with them immediately following your visit.   A [facility] MyChart account gives you access to today's visit and all your visits, tests, and labs performed at [facility]  click here if you don't have a [facility] MyChart account or go to [email or web address]  Consent: (Patient) Laura Rogers provided verbal consent for this virtual visit at the beginning of the encounter.  Current Medications:  Current Outpatient Medications:    mupirocin ointment (BACTROBAN) 2 %, Apply 1 Application topically 2 (two) times daily., Disp: 22 g, Rfl: 0   podofilox (CONDYLOX) 0.5 % external solution, Apply topically every 12 hours in the morning and evening for 3 days, then withhold for 4 days; repeat cycle up to 4 times, Disp: 3.5 mL, Rfl: 0   valACYclovir  (VALTREX ) 500 MG tablet, Take 1 tablet (500 mg total) by mouth 2 (two) times daily for 5 days., Disp: 10 tablet, Rfl: 0   albuterol  (VENTOLIN  HFA) 108 (90 Base) MCG/ACT inhaler, Inhale 2 puffs into the lungs every 6 (six) hours as needed for wheezing or shortness of breath., Disp: 8 g, Rfl: 6   ALPRAZolam  (XANAX ) 1 MG tablet, Take 1 tablet (1 mg total) by mouth 2 (two) times daily as needed for anxiety. (Patient not taking: Reported on 02/16/2024), Disp: 30 tablet, Rfl: 0   ARIPiprazole  (ABILIFY ) 10 MG tablet, Take 1 tablet (10 mg total) by mouth daily., Disp: 30 tablet, Rfl: 1   dolutegravir -lamiVUDine  (DOVATO ) 50-300 MG tablet, Take 1 tablet by mouth daily., Disp: 30 tablet, Rfl: 6   EPINEPHrine  (EPIPEN  2-PAK) 0.3 mg/0.3 mL IJ SOAJ injection, Inject 0.3 mg into the muscle as needed for anaphylaxis. (Patient not taking: Reported on 02/16/2024), Disp: 1 each, Rfl: 0   hydrOXYzine  (ATARAX ) 10 MG tablet, Take  1 tablet (10 mg total) by mouth 3 (three) times daily as needed. (Patient not taking: Reported on 10/31/2023), Disp: 60 tablet, Rfl: 5   mirtazapine  (REMERON ) 15 MG tablet, Take 0.5-1 tablets (7.5-15 mg total) by mouth at bedtime., Disp: 30 tablet, Rfl: 1   Medications ordered in this encounter:  Meds ordered this encounter  Medications   valACYclovir  (VALTREX ) 500 MG tablet    Sig: Take 1 tablet (500 mg total) by mouth 2 (two) times daily for 5 days.    Dispense:  10 tablet    Refill:  0    Supervising Provider:   LAMPTEY, PHILIP O [[id number]]   mupirocin ointment (BACTROBAN) 2 %    Sig: Apply 1 Application topically 2 (two) times daily.    Dispense:  22 g    Refill:  0    Supervising Provider:   BLAISE ALEENE KIDD [[id number]]   podofilox (CONDYLOX) 0.5 % external solution    Sig: Apply topically every 12 hours in the morning and evening for 3 days, then withhold for 4 days; repeat cycle up to 4 times    Dispense:  3.5 mL    Refill:  0    Supervising Provider:   BLAISE ALEENE KIDD [[id number]]     *If you need refills on other medications prior to your next appointment, please contact your pharmacy*  Follow-Up: Call back or seek an in-person evaluation if the symptoms worsen or if the condition fails to improve as anticipated.  [facility] [phone]  Other Instructions  Folliculitis  Folliculitis occurs when hair follicles become inflamed. A hair follicle is a tiny opening in your skin where your hair grows from. This condition often occurs on the scalp, thighs, legs, back, and buttocks but can happen anywhere on the body. What are the causes? A common cause of this condition is an infection from bacteria. The type of folliculitis caused by bacteria can last a long time or go away and come back. The bacteria can live anywhere on your skin. They are often found in the nostrils. Other causes may include: An infection from a fungus. An infection from a virus. Your skin  touching some chemicals, such as oils and tars. Shaving or waxing. Greasy ointments or creams put on the skin. What increases the risk? You are more likely to develop this condition if: Your body has a weak disease-fighting system (immune system). You have diabetes. You are obese. What are the signs or symptoms? Symptoms of this condition include: Redness. Soreness. Swelling. Itching. Small white or yellow, itchy spots filled with pus (pustules) that appear over a red area. If the infection goes deep into the follicle, these may turn into a boil (furuncle). A group of boils (carbuncle). These tend to form in hairy, sweaty areas of the body. How is this diagnosed? This condition is diagnosed with a skin exam. Your health care provider may take a sample of one of the pustules or boils to test in a lab. How is this treated? This condition may be treated by: Putting a warm, wet cloth (warm compress) on the affected areas. Taking antibiotics or applying them to the skin. Applying or bathing with a solution that kills germs (antiseptic). Taking an over-the-counter medicine. This can help with itching. Having a procedure to drain pustules or boils. This may be done if a pustule or boil contains a lot of pus or fluid. Having laser hair removal. This may be done when the condition lasts for a long time. Follow these instructions at home: Managing pain and swelling  If directed, apply heat to the affected area as often as told by your health care provider. Use the heat source that your health care provider recommends, such as a moist heat pack or a heating pad. Place a towel between your skin and the heat source. Leave the heat on for 20-30 minutes. If your skin turns bright red, remove the heat right away to prevent burns. The risk of burns is higher if you cannot feel pain, heat, or cold. General instructions Take over-the-counter and prescription medicines only as told by your health care  provider. If you were prescribed antibiotics, take or apply them as told by your health care provider. Do not stop using the antibiotic even if you start to feel better. Check your irritated area every day for signs of infection. Check for: More redness, swelling, or pain. Fluid or blood. Warmth. Pus or a bad smell. Do not shave irritated skin. Keep all follow-up visits. Your health care provider will check if the treatments are helping. Contact a health care provider if: You have a fever. You have any signs of infection. Red streaks are spreading from the affected area. This information is not intended to replace advice given to you by your health care provider. Make sure you discuss any questions you have with your health care provider. Document Revised: 02/01/2022 Document Reviewed: 02/01/2022 Elsevier Patient Education  2024 Else[employer] Inc.  Genital Herpes Genital  herpes is a common sexually transmitted infection (STI) that is caused by a virus. The virus spreads from person to person through contact with a sore, infected saliva, or infected skin. The virus can cause itching, blisters, and sores around the genitals or rectum. During an outbreak of infection, symptoms may last for several days and then go away. However, the virus remains in the body, so more outbreaks may happen in the future. The time between outbreaks varies and can be from months to years. Genital herpes can affect anyone. It is particularly concerning for pregnant women because the virus can be passed to the baby during delivery. Genital herpes is also a concern for people who have a weak disease-fighting system (immune system). What are the causes? This condition is caused by the herpes simplex virus, type 1 or type 2 (HSV-1 or HSV-2). The virus may spread through: Sexual contact with an infected person, including vaginal, anal, and oral sex. Contact with a herpes sore. The skin. This means that you can get herpes from  an infected partner even if there are no blisters or sores present. Your partner may not know that he or she is infected. What increases the risk? You are more likely to develop this condition if: You have sex with many partners. You do not use latex or polyurethane condoms during sex. What are the signs or symptoms? Most people do not have symptoms or they have mild symptoms that may be mistaken for other skin problems. Symptoms may include: Small, red bumps near the genitals, rectum, or mouth. These bumps turn into blisters and then sores. Flu-like (influenza-like) symptoms, including: Fever. Body aches. Swollen lymph nodes. Headache. Painful urination. Pain and itching in the genital area or rectal area. Vaginal discharge. Tingling or shooting pain in the legs and buttocks. Generally, symptoms are more severe and last longer during the first (primary) outbreak. Influenza-like symptoms are also more common during the primary outbreak. How is this diagnosed? This condition may be diagnosed based on: A physical exam. Your medical history. Blood tests. A test of a fluid sample (culture) from an open sore. How is this treated? There is no cure for this condition, but treatment with antiviral medicines can do the following: Speed up healing and relieve symptoms. Help to reduce the spread of the virus to sexual partners. Limit the chance of future outbreaks, or make future outbreaks shorter. Lessen symptoms of future outbreaks. Your health care provider may also recommend over-the-counter medicines to help with pain and itching. Follow these instructions at home: If you have an outbreak:  Keep the affected areas dry and clean. Avoid rubbing or touching blisters and sores. If you do touch blisters or sores: Wash your hands thoroughly with soap and water for at least 20 seconds. If soap and water are not available, use an alcohol-based hand sanitizer. Do not touch your eyes  afterward. Sexual activity Do not have sexual contact during active outbreaks. Practice safe sex. Herpes can spread even if your partner does not have blisters or sores. Latex or polyurethane condoms and female condoms may help prevent the spread of the herpes virus. Managing pain and discomfort If directed, put ice on the painful area. To do this: Put ice in a plastic bag. Place a towel between your skin and the bag. Leave the ice on for 20 minutes, 2-3 times a day. Remove the ice if your skin turns bright red. This is very important. If you cannot feel pain, heat, or cold, you  have a greater risk of damage to the area. If told, take a cool sitz bath to help relieve pain or itching. A sitz bath is a water bath that you take while sitting down in water that is deep enough to cover your hips and buttocks. General instructions Take over-the-counter and prescription medicines only as told by your health care provider. If you were prescribed an antiviral medicine, use it as told by your health care provider. Do not stop using the antiviral even if you start to feel better. Keep all follow-up visits. This is important. How is this prevented? Use condoms. Although you can get genital herpes during sexual contact even with the use of a condom, a condom can provide some protection. Avoid having multiple sexual partners. Talk with your sexual partner about any symptoms either of you may have. Also, talk with your partner about any history of STIs. Do not have sexual contact if you have active symptoms of genital herpes. Contact a health care provider if: Your symptoms are not improving with medicine. Your symptoms return, or you have new symptoms. You have a fever. You have abdominal pain. You have redness, swelling, or pain in your eye. You notice new sores on other parts of your body. You have had herpes and you become pregnant or plan to become pregnant. Get help right away if: You have  symptoms of viral meningitis. This is rare but may happen if the virus spreads to the brain. Symptoms may include: Severe headache or stiff neck. Muscle aches. Nausea and vomiting. Sensitivity to light. Summary Genital herpes is a common sexually transmitted infection (STI) that is caused by the herpes simplex virus, type 1 or type 2 (HSV-1 or HSV-2). These viruses are most often spread through sexual contact with an infected person. You are more likely to develop this condition if you have sex with many partners or you do not use condoms during sex. Most people do not have symptoms or have mild symptoms that may be mistaken for other skin problems. Symptoms occur as outbreaks that may happen months or years apart. There is no cure for this condition, but treatment with oral antiviral medicines can reduce symptoms, reduce the chance of spreading the virus to a partner, prevent future outbreaks, or shorten future outbreaks. This information is not intended to replace advice given to you by your health care provider. Make sure you discuss any questions you have with your health care provider. Document Revised: 06/03/2021 Document Reviewed: 06/03/2021 Elsevier Patient Education  2024 Elsevier Inc.   Genital Warts  Genital warts are a common sexually transmitted infection (STI). They can be easily passed from person to person during sex. They look like small warts near the genitals or the opening of the butt (anus). What are the causes? Genital warts are caused by a virus called human papillomavirus (HPV). You may get HPV if you have sex without a condom with someone who has HPV. What increases the risk? Having sex without using a condom. Having sex with many people. Being a female who is not circumcised. Having sex with a female who is not circumcised. Having a weak body defense system (immune system). What are the signs or symptoms? Small warts near your genitals or butt. These may  be: Different sizes and shapes. Flat, round, or look like a cauliflower. The same color as your skin. Tell your doctor if you have: Itching, bleeding, or pain on the skin near your genitals, groin, or butt. Genital warts  that look odd, turn into open sores, or change in color. Pain during sex. In many cases, you may not have any symptoms. How is this treated? Medicines, such as creams, that you put on your skin. Procedures to: Freeze the warts. Burn the warts. Surgery to get rid of the warts. If you do not get treatment, the warts may go away, stay the same, or grow in size and number. Follow these instructions at home: Medicines  Apply over-the-counter and prescription medicines only as told by your doctor. Do not use medicines that are meant for treating hand or foot warts. Talk with your doctor about using creams to treat itching. General instructions Do not touch or scratch the warts. Tell your current and past sex partners that you have genital warts. They may need treatment. If you are female, get a Pap and HPV test as often as told by your doctor. If you get pregnant, tell your doctor that you have had genital warts. Keep all follow-up visits. Your doctor will watch you closely. Some types of HPV make you more likely to get cancer. How is this prevented? To prevent genital warts: Use a new condom each time you have sex. Limit how many people you have sex with. Have a sex partner who does not have other sex partners. Talk with your sex partners about their health. Get the HPV shot. This protects you against the types of HPV that could cause cancer. Where to find more information Centers for Disease Control and Prevention (CDC): tonerpromos.no Contact a doctor if: You have redness, swelling, or pain where your skin is being treated for the warts. You have pain or itching in your groin, genitals, or anus. You feel lumps in or around your genitals or butt. You have bleeding near  your genitals or butt. You have pain during sex, or you bleed after sex. This information is not intended to replace advice given to you by your health care provider. Make sure you discuss any questions you have with your health care provider. Document Revised: 04/20/2022 Document Reviewed: 04/20/2022 Elsevier Patient Education  2024 Elsevier Inc.   If you have been instructed to have an in-person evaluation today at a local Urgent Care facility, please use the link below. It will take you to a list of all of our available [facility], including address, phone number and hours of operation. Please do not delay care.  [facility]  If you or a family member do not have a primary care provider, use the link below to schedule a visit and establish care. When you choose a [facility] primary care physician or advanced practice provider, you gain a long-term partner in health. Find a Primary Care Provider  Learn more about [facility]'s in-office and virtual care options: [facility] Get Care Now

## 2024-07-10 NOTE — Telephone Encounter (Signed)
 Attempted to contact patient via telephone with no success.  Left detailed message to call the office back and asked to be transferred to someone inside the office to schedule an appointment with Dr. Eben.

## 2024-07-10 NOTE — Progress Notes (Signed)
 Virtual Visit Consent   Laura Rogers, you are scheduled for a virtual visit with a [facility] provider today. Just as with appointments in the office, your consent must be obtained to participate. Your consent will be active for this visit and any virtual visit you may have with one of our providers in the next 365 days. If you have a MyChart account, a copy of this consent can be sent to you electronically.  As this is a virtual visit, video technology does not allow for your provider to perform a traditional examination. This may limit your provider's ability to fully assess your condition. If your provider identifies any concerns that need to be evaluated in person or the need to arrange testing (such as labs, EKG, etc.), we will make arrangements to do so. Although advances in technology are sophisticated, we cannot ensure that it will always work on either your end or our end. If the connection with a video visit is poor, the visit may have to be switched to a telephone visit. With either a video or telephone visit, we are not always able to ensure that we have a secure connection.  By engaging in this virtual visit, you consent to the provision of healthcare and authorize for your insurance to be billed (if applicable) for the services provided during this visit. Depending on your insurance coverage, you may receive a charge related to this service.  I need to obtain your verbal consent now. Are you willing to proceed with your visit today? Laura Rogers has provided verbal consent on 07/10/2024 for a virtual visit (video or telephone). Delon CHRISTELLA Dickinson, PA-C  Date: 07/10/2024 11:38 AM   Virtual Visit via Video Note   I, Delon CHRISTELLA Dickinson, connected with  Laura Rogers  ([id number], 1987/03/05) on 07/10/24 at 11:00 AM EDT by a video-enabled telemedicine application and verified that I am speaking with the correct person using two identifiers.  Location: Patient: Virtual Visit  Location Patient: Home Provider: Virtual Visit Location Provider: Home Office   I discussed the limitations of evaluation and management by telemedicine and the availability of in person appointments. The patient expressed understanding and agreed to proceed.    History of Present Illness: Laura Rogers is a 48 y.o. who identifies as a female who was assigned female at birth, and is being seen today for genital herpes outbreak, possible folliculitis from shaving vs ingrown hairs, and a genital wart just outside the vaginal opening.   She does have a history of all of these issues in the past. She also has HIV. She is followed by Dr. Eben, Infectious Disease. Her last HSV flare was in May 2023.   Over the last few days as had an increase of genital itching where she does shave her pubic hairs, similar to ingrown hairs/folliculitis in the past.   She is also having a small genital herpes outbreak, thinks triggered by some recent stressors.  Does mention a genital wart noted on the vaginal labia.   Problems:  Patient Active Problem List   Diagnosis Date Noted   HSV-1 (herpes simplex virus 1) infection 01/21/2022   Bronchitis 01/14/2022   Screening for cervical cancer 11/25/2021   Dental infection 11/11/2021   Generalized anxiety disorder 01/02/2021   Nausea 03/05/2019   Insomnia 12/14/2018   Seasonal allergies 12/14/2018   Bipolar affective (HCC) 07/20/2017   Migraine without aura and without status migrainosus, not intractable 06/03/2015   CIN I (cervical intraepithelial neoplasia  I) 05/04/2015   Migraine 04/06/2015   Herpes genitalia 04/09/2014   Other and unspecified hyperlipidemia 12/05/2012   Constipation 04/11/2011   HIV (human immunodeficiency virus infection) (HCC) 04/06/2011    Allergies:  Allergies  Allergen Reactions   Amoxicillin Shortness Of Breath   Levofloxacin Shortness Of Breath and Other (See Comments)    Pt states she also has severe sweating, dehydration  and bumbs on her tongue.   Valacyclovir  Hcl Anaphylaxis, Hives and Swelling    Also c/o purple legs when she stands. Sent to ed   Sulfamethoxazole -Trimethoprim  Rash   Levofloxacin    Penicillins Other (See Comments)    Childhood- unsure of reaction. Mother is allergic to penicillin, so Laura Rogers never took this.    Bactrim  Rash   Medications:  Current Outpatient Medications:    mupirocin ointment (BACTROBAN) 2 %, Apply 1 Application topically 2 (two) times daily., Disp: 22 g, Rfl: 0   podofilox (CONDYLOX) 0.5 % external solution, Apply topically every 12 hours in the morning and evening for 3 days, then withhold for 4 days; repeat cycle up to 4 times, Disp: 3.5 mL, Rfl: 0   valACYclovir  (VALTREX ) 500 MG tablet, Take 1 tablet (500 mg total) by mouth 2 (two) times daily for 5 days., Disp: 10 tablet, Rfl: 0   albuterol  (VENTOLIN  HFA) 108 (90 Base) MCG/ACT inhaler, Inhale 2 puffs into the lungs every 6 (six) hours as needed for wheezing or shortness of breath., Disp: 8 g, Rfl: 6   ALPRAZolam  (XANAX ) 1 MG tablet, Take 1 tablet (1 mg total) by mouth 2 (two) times daily as needed for anxiety. (Patient not taking: Reported on 02/16/2024), Disp: 30 tablet, Rfl: 0   ARIPiprazole  (ABILIFY ) 10 MG tablet, Take 1 tablet (10 mg total) by mouth daily., Disp: 30 tablet, Rfl: 1   dolutegravir -lamiVUDine  (DOVATO ) 50-300 MG tablet, Take 1 tablet by mouth daily., Disp: 30 tablet, Rfl: 6   EPINEPHrine  (EPIPEN  2-PAK) 0.3 mg/0.3 mL IJ SOAJ injection, Inject 0.3 mg into the muscle as needed for anaphylaxis. (Patient not taking: Reported on 02/16/2024), Disp: 1 each, Rfl: 0   hydrOXYzine  (ATARAX ) 10 MG tablet, Take 1 tablet (10 mg total) by mouth 3 (three) times daily as needed. (Patient not taking: Reported on 10/31/2023), Disp: 60 tablet, Rfl: 5   mirtazapine  (REMERON ) 15 MG tablet, Take 0.5-1 tablets (7.5-15 mg total) by mouth at bedtime., Disp: 30 tablet, Rfl: 1  Observations/Objective: Patient is well-developed,  well-nourished in no acute distress.  Resting comfortably at home.  Head is normocephalic, atraumatic.  No labored breathing.  Speech is clear and coherent with logical content.  Patient is alert and oriented at baseline.    Assessment and Plan: 1. Herpes simplex vulvovaginitis (Primary) - valACYclovir  (VALTREX ) 500 MG tablet; Take 1 tablet (500 mg total) by mouth 2 (two) times daily for 5 days.  Dispense: 10 tablet; Refill: 0  2. Folliculitis - mupirocin ointment (BACTROBAN) 2 %; Apply 1 Application topically 2 (two) times daily.  Dispense: 22 g; Refill: 0  3. Genital warts - podofilox (CONDYLOX) 0.5 % external solution; Apply topically every 12 hours in the morning and evening for 3 days, then withhold for 4 days; repeat cycle up to 4 times  Dispense: 3.5 mL; Refill: 0  - Valacyclovir  for genital herpes (patient has had possible hives from this in the past, but reports she was on a lot of medication and not sure it is what caused the hives and would like to try again) - Can  take a 24-hour non-drowsy antihistamine like Claritin with dose if needed - Mupirocin ointment for folliculitis/ingrown hairs - Exfoliate  - Avoid shaving until symptoms subside - Podofilox prescribed for genital wart - Seek in person evaluation if symptoms are not improving or if they worsen  Follow Up Instructions: I discussed the assessment and treatment plan with the patient. The patient was provided an opportunity to ask questions and all were answered. The patient agreed with the plan and demonstrated an understanding of the instructions.  A copy of instructions were sent to the patient via MyChart unless otherwise noted below.    The patient was advised to call back or seek an in-person evaluation if the symptoms worsen or if the condition fails to improve as anticipated.    Delon CHRISTELLA Dickinson, PA-C

## 2024-09-19 ENCOUNTER — Other Ambulatory Visit: Payer: Self-pay | Admitting: Infectious Diseases

## 2024-09-19 DIAGNOSIS — B20 Human immunodeficiency virus [HIV] disease: Secondary | ICD-10-CM

## 2024-09-19 NOTE — Telephone Encounter (Unsigned)
 Copied from CRM #[id number]. Topic: Clinical - Medication Refill >> Sep 19, 2024 12:47 PM Diannia H wrote: Medication: dolutegravir -lamiVUDine  (DOVATO ) 50-300 MG tablet  Has the patient contacted their pharmacy? Yes (Agent: If no, request that the patient contact the pharmacy for the refill. If patient does not wish to contact the pharmacy document the reason why and proceed with request.) (Agent: If yes, when and what did the pharmacy advise?)  This is the patient's preferred pharmacy:  [facility] [address], [place] - [address] [address] [place] [address] Phone: [phone] Fax: [phone]  Is this the correct pharmacy for this prescription? Yes If no, delete pharmacy and type the correct one.   Has the prescription been filled recently? No  Is the patient out of the medication? Yes  Has the patient been seen for an appointment in the last year OR does the patient have an upcoming appointment? Yes  Can we respond through MyChart? Yes  Agent: Please be advised that Rx refills may take up to 3 business days. We ask that you follow-up with your pharmacy.

## 2024-09-20 NOTE — Telephone Encounter (Signed)
 LOV - 02/2022. I called pt to schedule an appt - no answer; left message to call the office if she's still seeing Dr Eben if so need to schedule an appt. Per chart, pt has a [place] address

## 2024-09-20 NOTE — Telephone Encounter (Signed)
 Incoming call from pt- just started a new job and can't take off for an appt right now Has been scheduled to see Dr Eben on 03/10 (will get labs during visit) Has 3 days left on rx and doesn't want to run out [facility] [address]   Pt has relocated to [place], but does not want to change her ID physician .  Will send refill request to Dr Eben for review

## 2024-09-23 MED ORDER — DOVATO 50-300 MG PO TABS: 1.0000 | ORAL_TABLET | Freq: Every day | ORAL | 6 refills | Status: AC

## 2024-11-19 ENCOUNTER — Ambulatory Visit: Payer: Self-pay | Admitting: Infectious Diseases
